# Patient Record
Sex: Male | Born: 1950
Health system: Southern US, Community
[De-identification: ages and names within clinical notes are randomized; demographics above are authoritative.]

## PROBLEM LIST (undated history)

## (undated) DIAGNOSIS — I8390 Asymptomatic varicose veins of unspecified lower extremity: Secondary | ICD-10-CM

## (undated) DIAGNOSIS — B009 Herpesviral infection, unspecified: Secondary | ICD-10-CM

## (undated) DIAGNOSIS — I4891 Unspecified atrial fibrillation: Secondary | ICD-10-CM

## (undated) DIAGNOSIS — Z8619 Personal history of other infectious and parasitic diseases: Secondary | ICD-10-CM

## (undated) DIAGNOSIS — K5790 Diverticulosis of intestine, part unspecified, without perforation or abscess without bleeding: Secondary | ICD-10-CM

## (undated) DIAGNOSIS — E7800 Pure hypercholesterolemia, unspecified: Secondary | ICD-10-CM

## (undated) HISTORY — PX: TOTAL SHOULDER REPLACEMENT: SUR1217

## (undated) HISTORY — PX: OTHER SURGICAL HISTORY: SHX169

## (undated) HISTORY — PX: TONSILLECTOMY: SUR1361

## (undated) HISTORY — DX: Diverticulosis of intestine, part unspecified, without perforation or abscess without bleeding: K57.90

## (undated) HISTORY — DX: Herpesviral infection, unspecified: B00.9

## (undated) HISTORY — DX: Unspecified atrial fibrillation: I48.91

## (undated) HISTORY — DX: Pure hypercholesterolemia, unspecified: E78.00

## (undated) HISTORY — DX: Personal history of other infectious and parasitic diseases: Z86.19

---

## 2002-10-14 HISTORY — PX: COLONOSCOPY: SHX174

## 2003-04-01 ENCOUNTER — Ambulatory Visit (HOSPITAL_COMMUNITY): Admission: RE | Admit: 2003-04-01 | Discharge: 2003-04-01 | Payer: Self-pay | Admitting: Family Medicine

## 2003-04-02 ENCOUNTER — Encounter: Payer: Self-pay | Admitting: Surgery

## 2003-04-02 ENCOUNTER — Inpatient Hospital Stay (HOSPITAL_COMMUNITY): Admission: EM | Admit: 2003-04-02 | Discharge: 2003-04-05 | Payer: Self-pay

## 2003-04-19 ENCOUNTER — Encounter: Admission: RE | Admit: 2003-04-19 | Discharge: 2003-04-19 | Payer: Self-pay | Admitting: Family Medicine

## 2003-04-25 ENCOUNTER — Encounter: Admission: RE | Admit: 2003-04-25 | Discharge: 2003-04-25 | Payer: Self-pay | Admitting: Family Medicine

## 2003-04-25 ENCOUNTER — Encounter: Payer: Self-pay | Admitting: Family Medicine

## 2003-07-26 ENCOUNTER — Ambulatory Visit (HOSPITAL_COMMUNITY): Admission: RE | Admit: 2003-07-26 | Discharge: 2003-07-26 | Payer: Self-pay | Admitting: *Deleted

## 2004-02-22 ENCOUNTER — Encounter: Admission: RE | Admit: 2004-02-22 | Discharge: 2004-02-22 | Payer: Self-pay | Admitting: Family Medicine

## 2005-09-27 ENCOUNTER — Ambulatory Visit: Payer: Self-pay | Admitting: Sports Medicine

## 2007-07-30 ENCOUNTER — Ambulatory Visit: Payer: Self-pay | Admitting: Family Medicine

## 2008-01-01 ENCOUNTER — Ambulatory Visit: Payer: Self-pay | Admitting: Family Medicine

## 2008-01-05 ENCOUNTER — Ambulatory Visit: Payer: Self-pay | Admitting: Family Medicine

## 2008-01-15 ENCOUNTER — Ambulatory Visit: Payer: Self-pay | Admitting: Family Medicine

## 2008-09-30 ENCOUNTER — Ambulatory Visit: Payer: Self-pay | Admitting: Family Medicine

## 2008-10-06 ENCOUNTER — Ambulatory Visit: Payer: Self-pay | Admitting: Internal Medicine

## 2008-10-06 ENCOUNTER — Ambulatory Visit: Payer: Self-pay

## 2008-10-06 ENCOUNTER — Encounter: Payer: Self-pay | Admitting: Internal Medicine

## 2008-10-10 ENCOUNTER — Ambulatory Visit: Payer: Self-pay

## 2008-10-10 LAB — CONVERTED CEMR LAB
ALT: 52 units/L (ref 0–53)
AST: 35 units/L (ref 0–37)
Albumin: 3.8 g/dL (ref 3.5–5.2)
BUN: 20 mg/dL (ref 6–23)
Basophils Relative: 0.2 % (ref 0.0–3.0)
CO2: 28 meq/L (ref 19–32)
Chloride: 108 meq/L (ref 96–112)
Creatinine, Ser: 0.9 mg/dL (ref 0.4–1.5)
Eosinophils Absolute: 0.1 10*3/uL (ref 0.0–0.7)
Eosinophils Relative: 1.1 % (ref 0.0–5.0)
Lymphocytes Relative: 41.6 % (ref 12.0–46.0)
MCV: 88.3 fL (ref 78.0–100.0)
Neutrophils Relative %: 45.9 % (ref 43.0–77.0)
RBC: 5.26 M/uL (ref 4.22–5.81)
Total Protein: 6.7 g/dL (ref 6.0–8.3)
WBC: 6 10*3/uL (ref 4.5–10.5)
aPTT: 33.5 s — ABNORMAL HIGH (ref 21.7–29.8)

## 2008-10-11 ENCOUNTER — Ambulatory Visit: Payer: Self-pay | Admitting: Cardiology

## 2008-10-11 ENCOUNTER — Encounter: Payer: Self-pay | Admitting: Cardiology

## 2008-10-11 ENCOUNTER — Ambulatory Visit (HOSPITAL_COMMUNITY): Admission: RE | Admit: 2008-10-11 | Discharge: 2008-10-11 | Payer: Self-pay | Admitting: Cardiology

## 2008-10-12 ENCOUNTER — Ambulatory Visit: Payer: Self-pay | Admitting: Cardiology

## 2008-10-18 ENCOUNTER — Ambulatory Visit: Payer: Self-pay | Admitting: Internal Medicine

## 2008-10-19 ENCOUNTER — Ambulatory Visit: Payer: Self-pay | Admitting: Internal Medicine

## 2008-10-21 ENCOUNTER — Ambulatory Visit (HOSPITAL_COMMUNITY): Admission: RE | Admit: 2008-10-21 | Discharge: 2008-10-21 | Payer: Self-pay | Admitting: Internal Medicine

## 2008-10-24 ENCOUNTER — Ambulatory Visit: Payer: Self-pay

## 2008-10-25 ENCOUNTER — Ambulatory Visit: Payer: Self-pay | Admitting: Internal Medicine

## 2008-11-03 ENCOUNTER — Ambulatory Visit: Payer: Self-pay | Admitting: Internal Medicine

## 2008-11-10 ENCOUNTER — Ambulatory Visit: Payer: Self-pay | Admitting: Cardiology

## 2008-11-10 ENCOUNTER — Ambulatory Visit: Payer: Self-pay | Admitting: Internal Medicine

## 2008-12-01 ENCOUNTER — Ambulatory Visit: Payer: Self-pay | Admitting: Family Medicine

## 2008-12-01 ENCOUNTER — Ambulatory Visit: Payer: Self-pay | Admitting: Internal Medicine

## 2008-12-08 ENCOUNTER — Ambulatory Visit: Payer: Self-pay | Admitting: Cardiology

## 2008-12-23 ENCOUNTER — Encounter: Payer: Self-pay | Admitting: Internal Medicine

## 2008-12-23 ENCOUNTER — Ambulatory Visit: Payer: Self-pay | Admitting: Internal Medicine

## 2009-01-05 ENCOUNTER — Ambulatory Visit: Payer: Self-pay | Admitting: Internal Medicine

## 2009-02-02 ENCOUNTER — Ambulatory Visit: Payer: Self-pay | Admitting: Cardiovascular Disease

## 2009-02-15 ENCOUNTER — Ambulatory Visit: Payer: Self-pay | Admitting: Family Medicine

## 2009-03-02 ENCOUNTER — Ambulatory Visit: Payer: Self-pay | Admitting: Internal Medicine

## 2009-03-14 ENCOUNTER — Encounter: Payer: Self-pay | Admitting: *Deleted

## 2009-03-20 ENCOUNTER — Ambulatory Visit: Payer: Self-pay | Admitting: Cardiology

## 2009-03-27 ENCOUNTER — Ambulatory Visit: Payer: Self-pay | Admitting: Cardiology

## 2009-03-27 LAB — CONVERTED CEMR LAB
POC INR: 1.9
Protime: 17

## 2009-03-30 ENCOUNTER — Ambulatory Visit: Payer: Self-pay | Admitting: Internal Medicine

## 2009-03-30 ENCOUNTER — Telehealth (INDEPENDENT_AMBULATORY_CARE_PROVIDER_SITE_OTHER): Payer: Self-pay | Admitting: *Deleted

## 2009-03-31 LAB — CONVERTED CEMR LAB
Basophils Relative: 0.8 % (ref 0.0–3.0)
Chloride: 107 meq/L (ref 96–112)
Creatinine, Ser: 1 mg/dL (ref 0.4–1.5)
Eosinophils Relative: 1.7 % (ref 0.0–5.0)
HCT: 43.1 % (ref 39.0–52.0)
INR: 2.8 — ABNORMAL HIGH (ref 0.8–1.0)
MCV: 88 fL (ref 78.0–100.0)
Monocytes Absolute: 0.7 10*3/uL (ref 0.1–1.0)
Neutrophils Relative %: 47.5 % (ref 43.0–77.0)
Potassium: 4.3 meq/L (ref 3.5–5.1)
RBC: 4.9 M/uL (ref 4.22–5.81)
WBC: 6.4 10*3/uL (ref 4.5–10.5)

## 2009-04-03 ENCOUNTER — Ambulatory Visit: Payer: Self-pay | Admitting: Internal Medicine

## 2009-04-03 LAB — CONVERTED CEMR LAB
POC INR: 3.3
Protime: 22

## 2009-04-06 ENCOUNTER — Encounter: Payer: Self-pay | Admitting: Internal Medicine

## 2009-04-10 ENCOUNTER — Ambulatory Visit: Payer: Self-pay | Admitting: Cardiovascular Disease

## 2009-04-10 ENCOUNTER — Encounter (INDEPENDENT_AMBULATORY_CARE_PROVIDER_SITE_OTHER): Payer: Self-pay | Admitting: Cardiology

## 2009-04-10 ENCOUNTER — Ambulatory Visit: Payer: Self-pay | Admitting: Internal Medicine

## 2009-04-10 LAB — CONVERTED CEMR LAB
BUN: 23 mg/dL (ref 6–23)
Basophils Absolute: 0 10*3/uL (ref 0.0–0.1)
Calcium: 9.3 mg/dL (ref 8.4–10.5)
Creatinine, Ser: 1.1 mg/dL (ref 0.4–1.5)
Eosinophils Relative: 1.9 % (ref 0.0–5.0)
GFR calc non Af Amer: 73.11 mL/min (ref >60)
HCT: 43.2 % (ref 39.0–52.0)
Lymphocytes Relative: 42.8 % (ref 12.0–46.0)
Monocytes Relative: 11.5 % (ref 3.0–12.0)
Neutrophils Relative %: 43.7 % (ref 43.0–77.0)
Platelets: 198 10*3/uL (ref 150.0–400.0)
Potassium: 4 meq/L (ref 3.5–5.1)
Prothrombin Time: 25.7 s
WBC: 6.8 10*3/uL (ref 4.5–10.5)
aPTT: 43.6 s — ABNORMAL HIGH (ref 21.7–28.8)

## 2009-04-14 ENCOUNTER — Ambulatory Visit: Payer: Self-pay | Admitting: Cardiovascular Disease

## 2009-04-18 ENCOUNTER — Encounter: Payer: Self-pay | Admitting: Internal Medicine

## 2009-04-18 ENCOUNTER — Ambulatory Visit: Payer: Self-pay | Admitting: Internal Medicine

## 2009-04-18 ENCOUNTER — Ambulatory Visit (HOSPITAL_COMMUNITY): Admission: RE | Admit: 2009-04-18 | Discharge: 2009-04-19 | Payer: Self-pay | Admitting: Internal Medicine

## 2009-04-19 ENCOUNTER — Encounter: Payer: Self-pay | Admitting: *Deleted

## 2009-04-24 ENCOUNTER — Ambulatory Visit: Payer: Self-pay | Admitting: Cardiology

## 2009-04-24 ENCOUNTER — Encounter (INDEPENDENT_AMBULATORY_CARE_PROVIDER_SITE_OTHER): Payer: Self-pay | Admitting: Cardiology

## 2009-04-24 LAB — CONVERTED CEMR LAB
POC INR: 3.6
Prothrombin Time: 22.9 s

## 2009-05-12 ENCOUNTER — Ambulatory Visit: Payer: Self-pay | Admitting: Cardiology

## 2009-05-26 ENCOUNTER — Ambulatory Visit: Payer: Self-pay | Admitting: Cardiology

## 2009-05-26 LAB — CONVERTED CEMR LAB: POC INR: 2.7

## 2009-06-16 ENCOUNTER — Ambulatory Visit: Payer: Self-pay | Admitting: Internal Medicine

## 2009-06-16 LAB — CONVERTED CEMR LAB: POC INR: 2

## 2009-07-12 ENCOUNTER — Ambulatory Visit: Payer: Self-pay | Admitting: Cardiovascular Disease

## 2009-07-12 ENCOUNTER — Ambulatory Visit: Payer: Self-pay | Admitting: Internal Medicine

## 2009-08-09 ENCOUNTER — Encounter: Payer: Self-pay | Admitting: Cardiology

## 2009-08-09 ENCOUNTER — Ambulatory Visit: Payer: Self-pay | Admitting: Cardiology

## 2009-08-09 ENCOUNTER — Telehealth: Payer: Self-pay | Admitting: Internal Medicine

## 2009-10-16 ENCOUNTER — Ambulatory Visit: Payer: Self-pay | Admitting: Internal Medicine

## 2010-01-10 ENCOUNTER — Ambulatory Visit: Payer: Self-pay | Admitting: Family Medicine

## 2010-05-30 ENCOUNTER — Ambulatory Visit: Payer: Self-pay | Admitting: Internal Medicine

## 2010-05-30 DIAGNOSIS — E785 Hyperlipidemia, unspecified: Secondary | ICD-10-CM | POA: Insufficient documentation

## 2010-06-29 ENCOUNTER — Telehealth: Payer: Self-pay | Admitting: Internal Medicine

## 2010-07-02 ENCOUNTER — Ambulatory Visit: Payer: Self-pay | Admitting: Internal Medicine

## 2010-07-02 DIAGNOSIS — R42 Dizziness and giddiness: Secondary | ICD-10-CM | POA: Insufficient documentation

## 2010-07-02 DIAGNOSIS — I10 Essential (primary) hypertension: Secondary | ICD-10-CM | POA: Insufficient documentation

## 2010-10-02 ENCOUNTER — Ambulatory Visit: Payer: Self-pay | Admitting: Family Medicine

## 2010-11-13 NOTE — Assessment & Plan Note (Signed)
Summary: V4U      Allergies Added: NKDA  Visit Type:  Follow-up Referring Provider:  Berton Mount, MD Primary Provider:  Sharlot Gowda, MD   History of Present Illness: The patient presents today for routine electrophysiology followup. He reports doing very well since last being seen in our clinic. He is unaware of any further atrial fibrillation s/p ablation.  The patient denies symptoms of palpitations, chest pain, shortness of breath, orthopnea, PND, lower extremity edema, dizziness, presyncope, syncope, or neurologic sequela. The patient is tolerating medications without difficulties and is otherwise without complaint today.   Current Medications (verified): 1)  Vytorin 10-80 Mg Tabs (Ezetimibe-Simvastatin) .Marland Kitchen.. 1 By Mouth Once Daily 2)  Aspirin Ec 325 Mg Tbec (Aspirin) .... Take One Tablet By Mouth Daily 3)  Multivitamins   Tabs (Multiple Vitamin) .... Qd  Allergies (verified): No Known Drug Allergies  Past History:  Past Medical History: Reviewed history from 07/12/2009 and no changes required. 1. Persistent atrial fibrillation s/p atrial fibrillation ablation 7/10 2. Diverticulitis.  3. Hypercholesterolemia 4.Hypokalemia.  Past Surgical History: Reviewed history from 07/12/2009 and no changes required. S/p atrial fibrillation ablation 7/10  Social History: Reviewed history from 12/23/2008 and no changes required. 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 He denies tobacco alcohol or drug use.  Vital Signs:  Patient profile:   60 year old male Height:      69 inches Weight:      177 pounds BMI:     26.23 Pulse rate:   65 / minute BP sitting:   119 / 84  (left arm)  Vitals Entered By: Laurance Flatten CMA (May 30, 2010 10:11 AM)  Physical Exam  General:  Well developed, well nourished, in no acute distress. Head:  normocephalic and atraumatic Eyes:  PERRLA/EOM intact; conjunctiva and lids normal. Mouth:  Teeth, gums  and palate normal. Oral mucosa normal. Neck:  Neck supple, no JVD. No masses, thyromegaly or abnormal cervical nodes. Lungs:  Clear bilaterally to auscultation and percussion. Heart:  Non-displaced PMI, chest non-tender; regular rate and rhythm, S1, S2 without murmurs, rubs or gallops. Carotid upstroke normal, no bruit. Normal abdominal aortic size, no bruits. Femorals normal pulses, no bruits. Pedals normal pulses. No edema, no varicosities. Abdomen:  Bowel sounds positive; abdomen soft and non-tender without masses, organomegaly, or hernias noted. No hepatosplenomegaly. Msk:  Back normal, normal gait. Muscle strength and tone normal. Pulses:  pulses normal in all 4 extremities Extremities:  No clubbing or cyanosis. Neurologic:  Alert and oriented x 3.   EKG  Procedure date:  05/30/2010  Findings:      sinus rhythm 65 bpm, normal ekg  Impression & Recommendations:  Problem # 1:  ATRIAL FIBRILLATION (ICD-427.31) Doing well s/p afib ablation without futher episodes of afib off of AAD.  He takes ASA 325mg  daily for CHADs 2 score of 0.   The patient will return in 12 months.  Problem # 2:  HYPERLIPIDEMIA (ICD-272.4) I have advised pt of FDA recommendation to decrease simvastatin to 40mg  daily.  He understands but wishes to continue his present dose and discuss further with his PCP.  He will alert me for myalgias  Other Orders: EKG w/ Interpretation (93000)  Patient Instructions: 1)  Your physician recommends that you schedule a follow-up appointment in: 12 months with Dr Johney Frame

## 2010-11-13 NOTE — Assessment & Plan Note (Signed)
Summary: appt time @ 8:45/per check out/sf  Medications Added ASPIRIN EC 325 MG TBEC (ASPIRIN) Take one tablet by mouth daily      Allergies Added: NKDA  Visit Type:  Follow-up Referring Provider:  Berton Mount, MD Primary Provider:  Sharlot Gowda, MD   History of Present Illness: The patient presents today for routine electrophysiology followup. He reports doing very well since last being seen in our clinic. He is unaware of any further atrial fibrillation.  The patient denies symptoms of palpitations, chest pain, shortness of breath, orthopnea, PND, lower extremity edema, dizziness, presyncope, syncope, or neurologic sequela. The patient is tolerating medications without difficulties and is otherwise without complaint today.   Current Medications (verified): 1)  Vytorin 10-80 Mg Tabs (Ezetimibe-Simvastatin) .Marland Kitchen.. 1 By Mouth Once Daily 2)  Proamatine 2.5 Mg Tabs (Midodrine Hcl) .Marland Kitchen.. 1 By Mouth Two Times A Day 3)  Verapamil Hcl Cr 180 Mg Xr24h-Cap (Verapamil Hcl) .... Take One Capsule By Mouth Once A Day 4)  Aspirin Ec 325 Mg Tbec (Aspirin) .... Take One Tablet By Mouth Daily 5)  Multivitamins   Tabs (Multiple Vitamin) .... Qd  Allergies (verified): No Known Drug Allergies  Past History:  Past Medical History: Reviewed history from 07/12/2009 and no changes required. 1. Persistent atrial fibrillation s/p atrial fibrillation ablation 7/10 2. Diverticulitis.  3. Hypercholesterolemia 4.Hypokalemia.  Past Surgical History: Reviewed history from 07/12/2009 and no changes required. S/p atrial fibrillation ablation 7/10  Vital Signs:  Patient profile:   60 year old male Height:      69 inches Weight:      183 pounds BMI:     27.12 Pulse rate:   59 / minute BP sitting:   110 / 80  (left arm)  Vitals Entered By: Laurance Flatten CMA (October 16, 2009 9:58 AM)  Physical Exam  General:  Well developed, well nourished, in no acute distress. Head:  normocephalic and atraumatic Eyes:   PERRLA/EOM intact; conjunctiva and lids normal. Nose:  no deformity, discharge, inflammation, or lesions Mouth:  Teeth, gums and palate normal. Oral mucosa normal. Neck:  Neck supple, no JVD. No masses, thyromegaly or abnormal cervical nodes. Lungs:  Clear bilaterally to auscultation and percussion. Heart:  Non-displaced PMI, chest non-tender; regular rate and rhythm, S1, S2 without murmurs, rubs or gallops. Carotid upstroke normal, no bruit. Normal abdominal aortic size, no bruits. Femorals normal pulses, no bruits. Pedals normal pulses. No edema, no varicosities. Abdomen:  Bowel sounds positive; abdomen soft and non-tender without masses, organomegaly, or hernias noted. No hepatosplenomegaly. Msk:  Back normal, normal gait. Muscle strength and tone normal. Pulses:  pulses normal in all 4 extremities Extremities:  No clubbing or cyanosis. Neurologic:  Alert and oriented x 3. Skin:  Intact without lesions or rashes. Cervical Nodes:  no significant adenopathy Psych:  Normal affect.   EKG  Procedure date:  10/16/2009  Findings:      sinus rhythm 60 bpm, otherwise normal ekg  Impression & Recommendations:  Problem # 1:  ATRIAL FIBRILLATION (ICD-427.31) Doing well s/p afib ablation without futher episodes of afib.  He takes ASA 325mg  daily for CHADs 2 score of 0. I will stop verapamil and midodrine at this time.  The patient will return in 6 months.  Other Orders: EKG w/ Interpretation (93000)  Patient Instructions: 1)  Your physician recommends that you schedule a follow-up appointment in: 6 months with Dr Johney Frame  2)  Your physician has recommended you make the following change in your  medication: decrease Verapamil in 1/2 for 7 days then stop and decrease Proamatineto once daily for 10 days then stop

## 2010-11-13 NOTE — Assessment & Plan Note (Signed)
Summary: pt having problems/kl      Allergies Added: NKDA  Visit Type:  Follow-up Referring Provider:  Berton Mount, MD Primary Provider:  Sharlot Gowda, MD   History of Present Illness: The patient presents today for routine electrophysiology followup. He reports having difficulty since last being seen in our clinic. He states that he was recently at a football game in [place] when he lost his balance and fell.  He states that he had had "several beers" and clearly remembers tripping.  He then fell to the floor and had loss of consciousness.  He was seen in the ER and had an MRI of the brain which he states was unremarkable.  He did suffer a R eye contusion as well as R shoulder dislocation and other minor injuries.  He does not feel that syncope was the cause for the event.  He has had no further afib and denies CP, SOB, palpitaitons, or other cardiac concerns. His concern today is that his blood pressure at home during the past few days was 190s/80s.  He states that this was recorded during severe pain.  He feels that as his pain has improved, his BP has decreased.  He also reports orthostatic dizziness for several days.  He denies HA, visual changes, N/V, or other neuro complaints.  Current Medications (verified): 1)  Vytorin 10-80 Mg Tabs (Ezetimibe-Simvastatin) .Marland Kitchen.. 1 By Mouth Once Daily 2)  Aspirin Ec 325 Mg Tbec (Aspirin) .... Take One Tablet By Mouth Daily 3)  Multivitamins   Tabs (Multiple Vitamin) .... Qd  Allergies (verified): No Known Drug Allergies  Past History:  Past Medical History: Reviewed history from 07/12/2009 and no changes required. 1. Persistent atrial fibrillation s/p atrial fibrillation ablation 7/10 2. Diverticulitis.  3. Hypercholesterolemia 4.Hypokalemia.  Past Surgical History: Reviewed history from 07/12/2009 and no changes required. S/p atrial fibrillation ablation 7/10  Social History: Reviewed history from 12/23/2008 and no changes  required. 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 He denies tobacco alcohol or drug use.  Review of Systems       All systems are reviewed and negative except as listed in the HPI.   Vital Signs:  Patient profile:   60 year old male Height:      69 inches Weight:      176 pounds BMI:     26.08 Pulse rate:   68 / minute Pulse (ortho):   80 / minute BP sitting:   122 / 84  (left arm) BP standing:   122 / 82  Vitals Entered By: Laurance Flatten CMA (July 02, 2010 1:04 PM)  Serial Vital Signs/Assessments:  Time      Position  BP       Pulse  Resp  Temp     By           Lying LA  130/80   64                    Jewel Hardy CMA           Sitting   122/82   [address] CMA           Standing  122/82   80                    Laurance Flatten CMA  Standing  128/84   84                    Jewel Hardy CMA           Standing  120/82   86                    Jewel Hardy CMA   Physical Exam  General:  Well developed, well nourished, in no acute distress. Head:  normocephalic and atraumatic Eyes:  R eye with periorbital ecchymosis Mouth:  Teeth, gums and palate normal. Oral mucosa normal. Neck:  Neck supple, no JVD. No masses, thyromegaly or abnormal cervical nodes. Lungs:  Clear bilaterally to auscultation and percussion. Heart:  Non-displaced PMI, chest non-tender; regular rate and rhythm, S1, S2 without murmurs, rubs or gallops. Carotid upstroke normal, no bruit. Normal abdominal aortic size, no bruits. Femorals normal pulses, no bruits. Pedals normal pulses. No edema, no varicosities. Abdomen:  Bowel sounds positive; abdomen soft and non-tender without masses, organomegaly, or hernias noted. No hepatosplenomegaly. Msk:  no obvious deformity Pulses:  pulses normal in all 4 extremities Extremities:  No clubbing or cyanosis. Neurologic:  CNII-XII intact, strength/sensation are intact, FTF/rhomberg are normal today Skin:   Intact without lesions or rashes. Psych:  Normal affect.   EKG  Procedure date:  07/02/2010  Findings:      sinus rhythm 68 bpm, PR 184, Qtc 4341, otherwise normal ekg  Impression & Recommendations:  Problem # 1:  ATRIAL FIBRILLATION (ICD-427.31) maintaining sinus continue ASA  Problem # 2:  ESSENTIAL HYPERTENSION, BENIGN (ICD-401.1) at goal today, recent elevation in BP likely due to severe pain salt restriction advised pt to continue to follow BP and alert our office if BP remains significantly elevated at home I would consider an ARB which has been shown to be beneficial in reducing afib episodes if an antihypertensive is required  Problem # 3:  DIZZINESS (ICD-780.4) pt is mildly orthostatic today adequate hydration is advised follow-up with PCP if not improved given recent trauma  Problem # 4:  HYPERLIPIDEMIA (ICD-272.4) stable His updated medication list for this problem includes:    Vytorin 10-80 Mg Tabs (Ezetimibe-simvastatin) .Marland Kitchen... 1 by mouth once daily  Other Orders: EKG w/ Interpretation (93000)  Patient Instructions: 1)  INCREASE FLUIDS 2)  Your physician recommends that you schedule a follow-up appointment in: 1 YEAR.

## 2010-11-13 NOTE — Progress Notes (Signed)
Summary: c/o mild htn . b/p reading    Phone Note Call from Patient Call back at Home Phone [phone] Call back at [phone]   Caller: Patient Reason for Call: Talk to Nurse Details for Reason: c/o mild htn, b/p bounce 130-150. b/p today 140/89  ~ 6:30 -7a.m.  Initial call taken by: Lorne Skeens,  June 29, 2010 9:53 AM  Follow-up for Phone Call        pt called c/o heart feeling funny and having dizziness from lying to sitting not sitting to standing.  He was at a [employer] football game 2 weeks ago and feel coming out of the BR he woke up going to the hospital.  Thinks he tripped over a carpet but not for sure.  He was in rhythm at the hospital. He dislocated his shoulder in the fall.  He is concerned about all of these things.  Therefor Im going to set him up to see Dr Johney Frame next week Dennis Bast, RN, BSN  June 29, 2010 11:20 AM  Additional Follow-up for Phone Call Additional follow up Details #1::        Will arrange follow up on Mon at 12:45pm  Pt aware Dennis Bast, RN, BSN  June 29, 2010 11:28 AM

## 2011-01-20 LAB — PROTIME-INR
INR: 3.1 — ABNORMAL HIGH (ref 0.00–1.49)
Prothrombin Time: 31 seconds — ABNORMAL HIGH (ref 11.6–15.2)

## 2011-01-20 LAB — APTT: aPTT: 49 seconds — ABNORMAL HIGH (ref 24–37)

## 2011-01-28 LAB — BASIC METABOLIC PANEL
CO2: 25 mEq/L (ref 19–32)
Calcium: 9.1 mg/dL (ref 8.4–10.5)
Creatinine, Ser: 1.04 mg/dL (ref 0.4–1.5)
GFR calc Af Amer: >60 mL/min (ref >60)
Glucose, Bld: 93 mg/dL (ref 70–99)

## 2011-01-28 LAB — PROTIME-INR
INR: 1.8 — ABNORMAL HIGH (ref 0.00–1.49)
Prothrombin Time: 22.2 seconds — ABNORMAL HIGH (ref 11.6–15.2)

## 2011-01-28 LAB — CBC
MCHC: 33.8 g/dL (ref 30.0–36.0)
RDW: 12.9 % (ref 11.5–15.5)

## 2011-02-14 ENCOUNTER — Encounter: Payer: Self-pay | Admitting: Family Medicine

## 2011-02-14 DIAGNOSIS — K5790 Diverticulosis of intestine, part unspecified, without perforation or abscess without bleeding: Secondary | ICD-10-CM | POA: Insufficient documentation

## 2011-02-26 NOTE — Assessment & Plan Note (Signed)
[facility]                         ELECTROPHYSIOLOGY OFFICE NOTE   NAME:Zheng, JAMONTAE THWAITES                  MRN:          [id number]  DATE:11/10/2008                            DOB:          Apr 09, 1951    Mr. Noll comes in today having days that he feels good and feels  bad.  He had gone for TEE cardioversion on his flecainide dose of 100.  He has converted spontaneously to sinus rhythm.  Because of recurrent  symptoms of feeling breathless and fatigued, we empirically increased  his flecainide to 150 twice a day.  He comes in today feeling washed out  and tired and yesterday was quite breathless.  His breathlessness is  better today.   MEDICATIONS:  Currently include:  1. Diltiazem 180.  2. Coumadin.  3. Vytorin 10/80.  4. Flecainide 150 b.i.d.   PHYSICAL EXAMINATION:  VITAL SIGNS:  Blood pressure was 139/91 with  pulse of 58.  There was a mild orthostatic drop to 119/86 after 5  minutes of prolonged standing.  His weight was 182 which was stable.  LUNGS:  Clear.  HEART:  Sounds were regular.  EXTREMITIES:  Without edema.  I did not take his pulse.   Electrocardiogram dated today demonstrated sinus rhythm at 58 with  intervals of 0.24/0.11/0.43.   IMPRESSION:  1. Paroxysmal atrial fibrillation, on flecainide therapy.  2. Intermittent symptoms, questionably related to paroxysms of atrial      fibrillation, not now evident.  3. PR prolongation and QRS prolongation associated with drug compared      to electrocardiogram October 21, 2008, with PR interval of      0.22/0.09.   Mr. Skibinski has been taught to takehispulse.  We will try and  correlate his symptoms with either his atrial fibrillation or residual  of his atrial fibrillation.  We will see him again in 3-4 weeks' time.  We will also need to keep a close eye on his conduction.  While this is  anticipated in the setting of his 1C therapy with the recent up-  titration of the  flecainide, we will have to keep a still closer eye.  This may have some impact on our choices of alternative antiarrhythmic  drug, i.e., would propafenone do something similar?     Duke Salvia, MD, [facility]  Electronically Signed   SCK/MedQ  DD: 11/10/2008  DT: 11/11/2008  Job #: [id number]

## 2011-02-26 NOTE — Assessment & Plan Note (Signed)
[facility]                         ELECTROPHYSIOLOGY OFFICE NOTE   NAME:Maurice [name], Maurice [name]                  MRN:          [id number]  DATE:12/01/2008                            DOB:          Jul 18, 1951    [name] is seen because of problems following the initiation of  midodrine, ongoing problems with palpitations, and chest heaviness.  He  has had no atrial fibrillation as best as he can detect by palpitation,  but he has had sensation of his heart rate has been pounding.  He also  has a sense that there is heaviness in his heart.  The dizziness for  which admitted was started, it is reduced.  The fatigue that was, we  thought might be related to his diltiazem.  He is also better on the  verapamil, but the symptoms persist.   His blood pressure today was 120/90, his pulse was 60.  His weight was  183.  Lungs were clear.  Heart sounds were regular.  His extremities  were without edema.   Electrocardiogram dated today demonstrated sinus rhythm at 60 with  intervals of 0.22/0.09/0.42.  The axis was 75 degrees.  These parameters  are relatively stable compared to before.   IMPRESSION:  1. Paroxysmal atrial fibrillation.  2. Flecainide therapy for paroxysmal atrial fibrillation.  3. Ongoing problems with palpitations, question mechanism, they does      not think that is atrial fibrillation.  4. Fatigue, question drug effect.   [name] continues to have significant symptoms related to either  the arrhythmia or the medicinal therapy for the arrhythmia.  He heart  Dr. Jenel Lucks talk last night, and he was very enthused and asked if he  could be considered for catheter ablation, as an alternative to  medicinal therapy.  I told we would be glad to facilitate that.   In the interim, we will plan to decrease his flecainide from 150-100 mg  twice daily to see whether this further improves his symptoms.   We will maintain his other  medications, as they currently are set.   I also suggested that his fatigue may be something else.  He is fairly  certain that he does not have sleep disordered breathing and that the  symptoms all began at the time of his atrial fibrillation.   We will see him again in 6 weeks' time.    Duke Salvia, MD, [facility]  Electronically Signed   SCK/MedQ  DD: 12/01/2008  DT: 12/02/2008  Job #: [id number]

## 2011-02-26 NOTE — Discharge Summary (Signed)
NAMEDERIUS, GHOSH NO.:  [id number]   MEDICAL RECORD NO.:  [id number]          PATIENT TYPE:  INP   LOCATION:  2924                         FACILITY:  [facility]   PHYSICIAN:  Hillis Range, MD       DATE OF BIRTH:  August 19, 1951   DATE OF ADMISSION:  04/18/2009  DATE OF DISCHARGE:  04/19/2009                               DISCHARGE SUMMARY   PRIMARY CARDIOLOGIST:  Duke Salvia, MD, [facility]   DISCHARGE DIAGNOSES:  1. Persistent atrial fibrillation  2. Atypical atrial flutter following atrial fibrillation ablation.      The patient successfully returned to sinus rhythm with ibutilide.  3. Chronic anticoagulation therapy with an INR of 2.7 at time of      discharge.   Past medical history includes:  1. Persistent atrial fibrillation.  2. Diverticulitis.  3. Hypercholesteremia.  4. Hypokalemia.   Procedures this admission include  1. Comprehensive electrophysiologic study with three-dimensional      mapping of atrial fibrillation.  Successful electrical isolation      and anatomical encircling of all 4 pulmonary veins using      radiofrequency current with ablation of complex fractionated atrial      electrograms along the roots of the left atrium.  The patient also      noted to have a driver within the right inferior pulmonary vein      which was successfully ablated by electrically isolating this vein      with spontaneous termination of atrial fibrillation upon isolation      of the right inferior pulmonary vein.  2. Transesophageal echocardiogram on April 18, 2009, by Dr. Olga Millers.  The patient with estimated ejection fraction of 55-60%,      trivial regurgitation of the aortic valve, mild regurgitation of      mitral valve.   HOSPITAL COURSE:  Mr. Wainwright is a 60 year old Caucasian gentleman  with persistent atrial fibrillation, ongoing palpitations, chest  heaviness, dizziness with initiation of midodrine therapy in the past,  ongoing fatigue,  symptoms related to either the arrhythmia or the  medical therapy for the arrhythmia.  However, ongoing symptoms Dr. Graciela Husbands  referred the patient to Dr. Johney Frame for consideration of atrial  fibrillation ablation.  The patient was educated on the risks and  benefits.  The patient agreed to proceed, admitted on April 18, 2009,  underwent TEE and EP lab with Dr. Jens Som and atrial fibrillation  ablation as stated above.  The patient initially had no inducible  arrhythmias following the ablation.  However, following morning the  patient with atypical atrial flutter treated with ibutilide, also  received magnesium and verapamil.  Within 5-hour window, the patient  converted to normal sinus rhythm, and repeat 12-lead EKG confirming  sinus rhythm with a first-degree block with a PACs at a rate of 64, PR  interval of 232 milliseconds, QRS of 88 milliseconds, QT of 436, and QTc  of 449 milliseconds.  The patient anxious to be discharged home.  I  scheduled him to follow up in the [facility]madin Clinic on Monday, April 24, 2009, at 3:15.  As stated, his INR is therapeutic at time of discharge  and follow up with Dr. Johney Frame on July 12, 2009, at 9:45.   Medications at time of discharge include:  1. Prilosec 20 mg daily for 6 weeks.  2. Flecainide 50 mg b.i.d.  3. Verapamil 180 mg daily.  4. Coumadin 5 mg daily or as instructed by Coumadin Clinic.  5. Vytorin 10/80 daily.  6. Multivitamin as previously taken.  7. Midodrine 2.5 mg b.i.d.  8. Aspirin 81 mg daily.   Duration of discharge encounter at 30 minutes.      Dorian Pod, ACNP      Hillis Range, MD  Electronically Signed    MB/MEDQ  D:  04/19/2009  T:  04/20/2009  Job:  [id number]

## 2011-02-26 NOTE — Op Note (Signed)
NAMEKEVRON, PATELLA NO.:  [id number]   MEDICAL RECORD NO.:  [id number]          PATIENT TYPE:  INP   LOCATION:  2924                         FACILITY:  [facility]   PHYSICIAN:  Hillis Range, MD       DATE OF BIRTH:  [birth year]   DATE OF PROCEDURE:  04/18/2009  DATE OF DISCHARGE:                               OPERATIVE REPORT   PREPROCEDURE DIAGNOSIS:  Persistent atrial fibrillation.   POSTPROCEDURE DIAGNOSES:  Persistent atrial fibrillation.   PROCEDURES:  1. Comprehensive electrophysiologic study.  2. Coronary sinus pacing and recording.  3. Three-dimensional mapping of supraventricular tachycardia.  4. Ablation of supraventricular tachycardia.  5. Intracardiac echocardiography.  6. Arterial blood pressure monitoring.  7. Pulmonary vein venography.  8. Transseptal puncture of an intact septum.   INTRODUCTION:  [name] is a pleasant 60 year old gentleman with a  history of persistent atrial fibrillation who presents today for EP  study and radiofrequency ablation.  He has previously failed medical  therapy with diltiazem and flecainide.  He has previously required  cardioversion in December 2009.  He now presents for EP study and  radiofrequency ablation.   DESCRIPTION OF PROCEDURE:  Informed written consent was obtained and the  patient was brought to the electrophysiology lab in a fasting state.  He  was adequately sedated with intravenous medications as outlined in the  anesthesia report.  The patient's right and left groins were prepped and  draped in the usual sterile fashion by the EP lab staff.  Using a  percutaneous Seldinger technique, one 6, one 7, and one 8 [place]  hemostasis sheaths were placed in the right common femoral vein.  An 27-  [place] hemostasis sheath was placed in the left common femoral vein.  A  4-French hemostasis sheath was placed in the right common femoral artery  for blood pressure monitoring.  The patient presented to  the  electrophysiology lab in normal sinus rhythm.  A 6-French decapolar  Polaris X catheter was introduced through the right common femoral vein  and advanced into the coronary sinus for recording and pacing from this  location.  A 6-French quadripolar Josephson catheter was introduced into  the right ventricle for recording and pacing and then pulled back to the  His bundle location.  The patient presented to the electrophysiology lab  in normal sinus rhythm.  His AH interval measured 110 milliseconds with  an HV interval of 51 milliseconds.  His RR interval measured 1100  milliseconds with a PR duration of 210 milliseconds, QRS duration of 110  milliseconds, and QT interval of 456 milliseconds.  Ventricular pacing  was performed which revealed VA dissociation at baseline.  A 10-French  Biosense Webster AcuNav intracardiac echocardiography catheter was  introduced through the left common femoral vein and advanced into the  right atrium.  Intracardiac echocardiography was then performed to  evaluate the left atrium.  This demonstrated minimal enlargement of the  left atrium.  All four pulmonary veins were noted to be moderate in size  with no evidence of pulmonary vein stenosis.  Each pulmonary vein had  its own separate ostium.  The middle right common femoral vein sheath  was then exchanged for an 8.5 [place] SL2 transseptal sheath and  transseptal access was achieved in a standard fashion using a  Brockenbrough needle under biplane fluoroscopy with intracardiac  echocardiography visualization of the transseptal puncture.  Once  transseptal access had been achieved, heparin was administered  intravenously and intra-arterially in order to maintain an ACT of  greater than 400 seconds throughout the procedure.  The His bundle  catheter was removed.  A 6-French multipurpose angiographic catheter  with guidewire was introduced through the transseptal sheath and  positioned over the mouth of  all four pulmonary veins.  Pulmonary  venograms were performed by hand injection of nonionic contrast and  demonstrated moderate-sized pulmonary veins with no evidence of  pulmonary vein stenosis.  The angiographic catheter was then removed.  A  3.5 mm Biosense Webster EZ [place] ThermoCool ablation catheter was  introduced through the right common femoral vein and advanced into the  right atrium.  The transseptal sheath was pulled back into the IVC over  a guidewire.  The ablation catheter was advanced across the transseptal  hole using the wire as a guide.  The transseptal sheath was then re-  advanced over the guidewire into the left atrium.  A 20-pole 20-mm  circular mapping catheter was introduced through the transseptal sheath  and positioned over the mouth of all four pulmonary veins.  Three-  dimensional electroanatomical mapping was performed using CartoSound  technology.  The patient was noted to have electrical activity within  all four pulmonary veins at baseline.  The patient underwent successful  sequential electrical isolation and anatomical encircling using  radiofrequency current with the circular mapping catheter as a guide.  The patient developed spontaneous onset of atrial fibrillation.  Complex  fractionated atrial electrograms were mapped along the roof of the left  atrium and successfully ablated in this location.  Additional mapping  along the posterior wall, interatrial septum, lateral wall and above the  coronary sinus was performed, however, there was a paucity of complex  fractionated atrial electrograms in this location.  Additional  radiofrequency applications were then delivered at the base of the right  inferior pulmonary vein.  The patient's atrial fibrillation terminated  with electrical isolation of the right inferior pulmonary vein.  The  patient remained in sinus rhythm thereafter.  Isoproterenol was then  infused up to 4 mcg per minute with an adequate  increase in heart rate  to 130 beats per minute.  The patient had no inducible arrhythmias with  isoproterenol infusion.  Isoproterenol was allowed to wash out and the  patient had no PACs, atrial tachycardia, atrial flutter, or atrial  fibrillation.  Rapid atrial pacing was then performed which revealed an  AV Wenckebach cycle length of 420 milliseconds with no inducible  arrhythmias.  The procedure was therefore considered completed.  All  catheters were removed and the sheaths were aspirated and flushed.  Intracardiac echocardiography was again performed which revealed no  pericardial effusion.  The patient was transferred to the recovery area  for sheath removal per protocol.  A limited bedside transthoracic  echocardiogram revealed no pericardial effusion.  There were no early  apparent complications.   CONCLUSIONS:  1. Sinus rhythm upon presentation.  2. Successful electrical isolation and anatomical encircling of all      four pulmonary veins using radiofrequency current.  3. Ablation of complex fractionated atrial electrograms along the roof      of the  left atrium.  4. The patient was noted to have a driver within the right inferior      pulmonary vein which was successfully ablated by electrically      isolating this vein with spontaneous termination of atrial      fibrillation upon isolation of the right inferior pulmonary vein.  5. No inducible arrhythmias following ablation with isoproterenol      infused up to 4 mcg per minute.  6. No early apparent complications.      Hillis Range, MD  Electronically Signed     JA/MEDQ  D:  04/18/2009  T:  04/19/2009  Job:  [id number]   cc:   Duke Salvia, MD, [facility]

## 2011-02-26 NOTE — Letter (Signed)
October 06, 2008    Sharlot Gowda, M.D.  [address]  [place], [place]  [address]   RE:  Maurice [name], Maurice [name]  MRN:  [id number]  /  DOB:  04-Feb-1951   Dear [name]:   It was a pleasure seeing Maurice [name] at your request  regarding his newly identified atrial fibrillation.   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  He presented last week to your office with tachy palpitations associated  lightheadedness, diaphoresis, and orthostatic intolerance which had  interrupted his ability to go to his spin class.  He was found in your  office to be in atrial fibrillation with a rate that was not that rapid  at 91.  You talked to our office and had him put on diltiazem and  referred for further evaluation.  He remains in atrial fibrillation  [name].  His symptoms have persisted as noted above and this has been  quite disruptive to his life.   It turns out that he has had similar symptoms before.  He was on  vacation in the [place] a couple years ago when he had an  episode that lasted a couple of days and terminated and then he had  another intercurrent episode.  I do recall duration which also was  terminated spontaneously.  He is quite active as noted above.  He is  very fit.  He does not have any exertional chest discomfort apart from  when he is in this rhythm.  In addition the aforementioned symptoms, he  has had a right-sided discomfort in his chest which occurs only with his  atrial fibrillation.   His thromboembolic risk factors are broadly negative for hypertension,  diabetes, prior stroke, heart failure vascular disease, and age.   His cardiac risk factors are notable for hypercholesterolemia.   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   In addition,  he has a history of diverticulitis.   He has had no prior surgeries.   SOCIAL HISTORY:  He is unmarried.  He has no children.  He lives with a  roommate.  He does not use cigarettes or recreational drugs.  He does  use alcohol some.   His medications include Vytorin 10/80, finasteride 5, diltiazem 180  started in your office, fish oils, and multivitamins, as well as aspirin  81 mg a day.   On examination, he is a healthy-appearing 60 year old Caucasian male.  His blood pressure is 116/77.  His pulse was 77 and irregular.  His HEENT exam demonstrated no icterus or xanthomata.  His neck veins  were flat.  His carotids are brisk and full bilaterally without bruits.  BACK:  Without kyphosis or scoliosis.  The lungs were clear.  HEART:  Heart sounds were irregular without murmurs or gallops.  ABDOMEN:  Soft  with active bowel sounds without midline pulsation or hepatomegaly.  Femoral pulses were 2+.  Distal pulses were intact.  There is no  clubbing, cyanosis or edema.  Neurological exam was grossly normal.  Skin was warm and dry.   Electrocardiogram dated [name] demonstrated atrial fibrillation at a rate  of 69 with intervals - 0.07/0.35.   IMPRESSION:  1. Atrial fibrillation with a controlled ventricular response.  2. Orthostatic intolerance/lightheadedness/dyspnea associated  with      number one.  3. Thromboembolic risk factors broadly negative.  4. Right-sided chest pain associated with number one but unassociated      with exertion otherwise.   Maurice [name], Maurice [name] is in atrial fibrillation with a relatively  controlled ventricular response and significant associated symptoms.  Restoration of sinus rhythm I think will be the best thing for symptom  amelioration.  Given the fact that it is Christmas Eve, we are little  bit of a quandary.  I have begun him on Coumadin with a plan to have him  come back on Monday for blood work and a transesophageal DC  cardioversion will be  scheduled for Tuesday with or without the need for  Lovenox support until his INR becomes therapeutic.   We had a long discussion regarding the hemodynamic effects of atrial  fibrillation and why it may be associated with his symptoms.  As I  reflect on this following his departure, I wonder whether some of his  worsening symptoms over the last couple of days may not  be aggravated  by relatively slow rates with atrial fibrillation; so, I plan to call  him and have his stop his diltiazem anticipation of next week as his  heart rate was only 91 when he saw you in your office.   As relates to thromboembolic risk, his Chad's-Vas score is 0; so  Coumadin would be appropriate as a transition to sinus rhythm.  Otherwise he should be able to be managed on aspirin.   As relates to longer-term strategies, frequency obviously will determine  our aggressiveness of therapy.  My thought at this point would be to  anticipate him coming to the hospital if he had another protracted  (greater than 12-hour) episode of atrial fibrillation at which time we  would give him oral flecainide at 300 mg to see if he could be converted  while and we was under telemetry observation.  In the event that were  successful, then using a cocktail at home would be appropriate.  In  anticipation of this strategy, he would be to have a Myoview scan to  make sure there is no occult coronary disease and this could be  scheduled after his TEE cardioversion next week.   We also need to make sure there are no medical contributors to the  above, for example hyperthyroidism and these blood assays will be  obtained next week as well.   Thanks very much for the consultation.   Maurice [name],    Sincerely,      Maurice Salvia, MD, [facility]  Electronically Signed    SCK/MedQ  DD: 10/06/2008  DT: 10/06/2008  Job #: [id number]

## 2011-02-26 NOTE — Assessment & Plan Note (Signed)
[facility]                         ELECTROPHYSIOLOGY OFFICE NOTE   NAME:Maurice [name] FALLS                  MRN:          [id number]  DATE:10/19/2008                            DOB:          1951-09-26    Maurice [name] is seen in followup for atrial fibrillation.  He was  cardioverted last Tuesday and unfortunately reverted to atrial  fibrillation yesterday.  This is quite discombobulating.  He is very  active physically.  His echo demonstrated no evidence of left  ventricular hypertrophy.  His INR today was elevated at 4.3.   OTHER MEDICATIONS:  1. Cardizem 180.  2. Vytorin 10/80.   ALLERGIES:  He has no known drug allergies.   PHYSICAL EXAMINATION:  VITAL SIGNS:  His blood pressure is 106/80; pulse  is 85, that was irregular; and weight is 180.  LUNGS:  Clear.  HEART:  Sounds were irregular with a 2/6 murmur.  ABDOMEN:  Soft.  EXTREMITIES:  Without edema.   Electrocardiogram demonstrated atrial fibrillation at 85 with intervals  - 0.07/0.35.  The axis was 85 degrees.   IMPRESSION:  1. Paroxysmal atrial fibrillation - recurrent status post      cardioversion 1 week ago.  2. INR - supratherapeutic at 4.3.  3. No known structural heart disease with normal left ventricular      function and normal left ventricular wall size by echo.   Maurice [name] is having a rapid recurrence of his atrial fibrillation.  We discussed the use of pill in the pocket as well as opposed to daily  antiarrhythmic drug therapy.  We discussed the role of flecainide, its  potential for side effects as well as potential for proarrhythmia.  Given the fact that he is very fit, I thought it is worth deferring his  stress test until after his cardioversion given the degree of his  symptoms.   To that end, we have decided to:  1. Begin him on flecainide 100 mg twice daily.  2. Continue him on his Cardizem 180.  3. Submit him for DC cardioversion on Friday.  4.  Submit him for stress Myoview targeting maximal heart rate as      opposed to 85% predicted max to assess both for ischemic burden as      well as for tachycardia and proarrhythmia.     Duke Salvia, MD, [facility]  Electronically Signed    SCK/MedQ  DD: 10/19/2008  DT: 10/20/2008  Job #: [id number]

## 2011-02-26 NOTE — Cardiovascular Report (Signed)
NAMENICK, STULTS NO.:  [id number]   MEDICAL RECORD NO.:  [id number]          PATIENT TYPE:  AMB   LOCATION:  ENDO                         FACILITY:  [facility]   PHYSICIAN:  Marca Ancona, MD      DATE OF BIRTH:  July 23, 1951   DATE OF PROCEDURE:  DATE OF DISCHARGE:                            CARDIAC CATHETERIZATION   CARDIOLOGIST:  Duke Salvia, MD, [facility]   PROCEDURE:  Transesophageal echocardiography with cardioversion.   INDICATION:  Symptomatic atrial fibrillation.   PROCEDURE:  The patient gave informed consent for the TEE cardioversion  prior to the procedure.  He has been on Coumadin for several days and  had a dose of Lovenox last night and this morning.  His INR is 1.9  today.  TEE was performed.  Please see separate dictation for the TEE.  There was no left atrial or left atrial appendage thrombus. Therefore,  we proceeded directly to the cardioversion.  The patient was sedated  using propofol by Anesthesia.  He then underwent synchronized direct  current cardioversion at 200 joules.  He was converted to normal sinus  rhythm with the first attempt.  He maintained sinus rhythm after the  procedure.  He will need to continue to take Lovenox until his INR is  therapeutic.  He will come in and get his INR drawn tomorrow at [facility].      Marca Ancona, MD  Electronically Signed     DM/MEDQ  D:  10/11/2008  T:  10/12/2008  Job:  [id number]   cc:   Duke Salvia, MD, [facility]

## 2011-02-26 NOTE — Consult Note (Signed)
[name], SCHOENBERGER NO.:  [id number]   MEDICAL RECORD NO.:  [id number]          PATIENT TYPE:  OIB   LOCATION:  2899                         FACILITY:  [facility]   PHYSICIAN:  Pricilla Riffle, MD, FACCDATE OF BIRTH:  [birth year]   DATE OF CONSULTATION:  10/21/2008  DATE OF DISCHARGE:                                 CONSULTATION   IDENTIFICATION:  [name] is a 60 year old gentleman who presents  today for cardioversion.  He has atrial fibrillation.   Yesterday, he began his first doses of flecainide at 100 mg b.i.d.   EKG this morning shows sinus bradycardia at a rate of 56 beats per  minute, first-degree AV block with a PR interval of 216 milliseconds.  There is evidence of left atrial abnormality.   The procedure was cancelled.  The patient has a Myoview scheduled for  Monday, which he will go ahead and proceed with and follow up with Dr.  Graciela Husbands.      Pricilla Riffle, MD, [facility]  Electronically Signed     PVR/MEDQ  D:  10/21/2008  T:  10/21/2008  Job:  [id number]

## 2011-03-01 NOTE — Discharge Summary (Signed)
Maurice [name]                     ACCOUNT NO.:  [id number]   MEDICAL RECORD NO.:  [id number]                   PATIENT TYPE:  INP   LOCATION:  3037                                 FACILITY:  [facility]   PHYSICIAN:  Pearlean Brownie, M.D.            DATE OF BIRTH:  [birth year]   DATE OF ADMISSION:  04/02/2003  DATE OF DISCHARGE:  04/05/2003                                 DISCHARGE SUMMARY   DISCHARGE DIAGNOSES:  1. Acute diverticulitis.  2. Hypercholesterolemia.  3. Hypokalemia.   DISCHARGE MEDICATIONS:  1. Augmentin 500/125 mg one tablet p.o. q.8h. x 7 days.  2. Tylenol No. 3 one tablet p.o. q.6h. p.r.n. moderate to severe pain, #5     tablets with no refills.  3. Zocor 40 mg p.o. daily.  4. Multivitamins one tablet p.o. daily.  5. Tylenol 650 mg p.o. q.6h. p.r.n. mild pain.   ACTIVITY:  As tolerated.   DIET:  Gradually increase your diet to regular.  Increase the fiber in your  diet to help reduce the risk of diverticulitis.   SPECIAL INSTRUCTIONS:  Call your doctor or seek medical care if you develop  worsening abdominal pain, fever, vomiting, or other problems.   FOLLOWUP:  Follow up as needed with your doctor.  We recommend colonoscopy  in one to two months.   BRIEF ADMISSION HISTORY:  Maurice [name] is a 60 year old male with a  history of hypercholesterolemia who was in his normal state of health until  about two days prior to admission when he developed dull lower abdominal  pain, left lower quadrant greater than right lower quadrant.  He also had  some nausea without emesis, but no diarrhea.  On the morning of the day  prior to admission, the patient was seen at [facility] where he was  afebrile and had a normal urinalysis, but did have an elevated white count  of 13.9 with 76% granulocytes.  He was felt to be constipated and was given  MiraLax and Fleets prescriptions.  The patient went home and used these  medications, but had worsening  pain, as well as development of fever and  chills and worsening nausea without emesis.  The [facility] returned to [facility] on the day prior to admission and was found to have a  temperature of 100.6 degrees, an increased white count of 15 with 79%  granulocytes, and per [facility] report, air/fluid levels on the  abdominal x-ray.  The patient was sent to [facility] for a CT scan of the  abdomen and pelvis with contrast, which showed diverticulitis.  The patient  was admitted for IV antibiotics.  On admission, the patient was afebrile  with a temperature of 97.3 degrees, blood pressure 122/81, heart rate 85, O2  saturation 98%, and respiratory rate 20.  The abdomen was soft with normal  bowel sounds.  No guarding or rebound, but moderate tenderness to palpation  of bilateral lower quadrants, left greater than right.  For the remainder of  the admission history and physical, please see the dictated note.   CONSULTS:  None.   STUDIES:  CT of the abdomen and pelvis with contrast, which showed sigmoid  diverticulitis.   HOSPITAL COURSE:  #1 - ACUTE DIVERTICULOSIS:  The patient was made NPO,  started on IV Unasyn, and given Tylenol as needed for fever, morphine IV as  needed for pain, and IV fluids.  A rectal exam was done which showed guaiac-  negative stools and soft brown stool in the vault.  The patient's diet was  slowly advanced secondary to continued pain and a decreased appetite.  For  the first several days of hospitalization, he was only able to tolerate sips  secondary to watery stools following any p.o. intake.  On hospital day #3,  the patient was better able to tolerate water and ice chips.  He was  therefore advanced to a clear liquid diet.  By the day of discharge, the  patient was able to tolerate a soft diet.  He had remained afebrile  throughout his hospitalization.  He had no abdominal tenderness.  During his  stay, he was switched from IV Unasyn to  Augmentin.  He was discharged home  on Augmentin to complete a 10-day course.  He was encouraged to increase the  fiber in his diet and to have a colonoscopy in one to two months.  Also  during his hospitalization, the patient was switched from IV morphine to  Tylenol No. 3 for pain control, which he rarely has used.  During his stay,  his diarrhea also resolved.   #2 - HYPERCHOLESTEROLEMIA:  The patient's Zocor was held while he was NPO,  but it was restarted at the time of discharge.  LFTs were within normal  limits during the hospitalization.  The cholesterol should continue to be  followed as an outpatient.   #3 - HYPOKALEMIA:  On admission, the patient's potassium was low at 3.3.  The patient received p.o. supplementation with some elevation of his  potassium so that on the day of discharge his potassium was 4.1.  His  hypokalemia was likely secondary to his diarrhea.   DISCHARGE LABORATORY DATA:  Serum sodium 141, potassium 4.1, chloride 108,  bicarbonate 27, glucose 98, BUN 8, creatinine 1.1, calcium 9.0, magnesium  2.2.     Georgina Peer, M.D.                 Pearlean Brownie, M.D.    JM/MEDQ  D:  04/20/2003  T:  04/21/2003  Job:  [id number]   cc:   Sharlot Gowda, M.D.  [address]  [place], [place] [address]  Fax: [phone]    cc:   Sharlot Gowda, M.D.  [address] [place], [place] [address]  Fax: [phone]

## 2011-03-01 NOTE — H&P (Signed)
Maurice [name], Maurice [name]                     ACCOUNT NO.:  [id number]   MEDICAL RECORD NO.:  [id number]                   PATIENT TYPE:  INP   LOCATION:  3037                                 FACILITY:  [facility]   PHYSICIAN:  Pearlean Brownie, M.D.            DATE OF BIRTH:  1951/05/05   DATE OF ADMISSION:  04/02/2003  DATE OF DISCHARGE:                                HISTORY & PHYSICAL   CHIEF COMPLAINT:  Lower abdominal pain, fever, and chills.   PRIMARY CARE PHYSICIAN:  [facility], [address]   HPI:  The patient is a 60 year old white male with a history of  hypercholesterolemia who was in his normal state of health until about two  days prior to admission when he developed dull lower abdominal pain, left  lower quadrant greater than right lower quadrant.  The patient also had  associated nausea without emesis and no diarrhea.  The patient thought it  was secondary to abdominal exercises he had done.  He was able to go to work  but was unable to sleep on the night prior to admission secondary to  worsening pain.  He went to [facility] yesterday morning (April 01, 2003) where he was found to be afebrile with a normal UA (specific gravity  1.030, pH 5 and negative) but an elevated white count of 13.9 with 76%  granulocytes, 15% lymphocytes.  He was thought to be constipated and  prescribed MiraLax and Fleet enemas.  The patient went home, but the pain  worsened with use of these medicines.  He also developed subjective fever  and chills, worsened nausea, without emesis.  He returned to [facility] later on the day prior to admission where he was found to have a  temperature of 100.6, a higher white count of 15, with 79% granulocytes, 12%  lymphocytes and, per the records from [facility], air fluid levels on  abdominal x-ray.  The patient was sent to [facility] for CT scan of  the abdomen and pelvis with contrast which showed  diverticulitis.  The  patient was admitted for IV antibiotic therapy.   PAST MEDICAL HISTORY:  Hypercholesterolemia.   PAST SURGICAL HISTORY:  None.   MEDICATIONS:  1. Zocor 40 mg p.o. daily.  2. Multivitamin 1 p.o. daily.   ALLERGIES:  No known drug allergies.   SOCIAL HISTORY:  The patient is single.  Lives with a roommate and five dogs  and three cats.  He is employed as a Marine scientist and a bookkeeper.  He  denies any tobacco use.  Does drink an occasional beer on the weekends and  reports a distant history of illicit drug use (marijuana).  He has a father  and brother who live in [place].   FAMILY HISTORY:  Mother died at age 33, dropped dead with possible cardiac  arrhythmia.  His dad is living at age 4, with hypertension.  He has one  brother with hypertension and high cholesterol and another brother who is  healthy.  He has no children.   REVIEW OF SYSTEMS:  The patient denies chest pain, shortness of breath,  bright red blood per rectum, or blood with bowel movements, urinary  complaints (hematuria, dysuria, frequency, urgency).  He describes his  typical bowel movements as loose/diarrhea.  He has had three or four  episodes of loose bowel movements in the emergency department, status post  p.o. contrast; no blood or mucous with these bowel movements.  He does not  eat red meat or fish.  He has no history of similar symptoms.  He does  report a decreased appetite x1 day.   PHYSICAL EXAMINATION:  VITAL SIGNS:  Weight 170 pounds, which is 77 kg,  height 78 inches.  Vital signs pending.  GENERAL:  Alert and oriented x4.  Pleasant.  In mild discomfort with sitting  up and lying down.  Otherwise in no acute distress.  HEENT:  PERRL.  EOMI.  Moist mucous membranes.  OP clear, without erythema  or lesions.  Good dentition.  Sclerae white.  NECK:  Supple, no lymphadenopathy.  CARDIOVASCULAR:  Regular rate and rhythm.  No murmurs, rubs, or gallops.  Normal PMI, 2+ and  symmetrical DP and radial pulses.  LUNGS:  Clear to auscultation bilaterally, with good effort.  No wheezes,  rhonchi, or rales.  ABDOMEN:  Normal bowel sounds, soft.  No guarding or rebound.  There is  moderate tenderness to palpation bilateral lower quadrants, left lower  quadrant greater than right lower quadrant.  EXTREMITIES:  No clubbing, cyanosis, or edema.  Moves all extremities  symmetrically.  RECTAL:  Pending.   LABORATORIES:  See HPI.  CT of the abdomen and pelvis with contrast showed  sigmoid diverticulitis.   ASSESSMENT AND PLAN:  The patient is a 60 year old white male with a two-day  history of worsening lower abdominal pain, left greater than right, fever,  chills and nausea.   1. Diverticulitis.  Seen on CT scan and consistent with fevers, chills,     nausea, abdominal pain, and elevated white count.  Admitted patient for     IV antibiotics (Unasyn).  The patient made n.p.o.  Given Tylenol as     needed for fever, morphine as needed for pain, and IV fluids.  Consider     changing to sips of clears in the morning and continuing to advance the     diet as tolerated.  Also will need to change to p.o. antibiotics and pain     medications as the patient improves.  Check a CBC with differential and a     CMP in the morning.  2. Hypercholesterolemia:  Holding the patient's Zocor while he is n.p.o.  3. Fluids, electrolytes, and nutrition:  The patient n.p.o.  Will receive     500 mL bolus, then IV fluids though he does not appear dry.  Check in's     and out's.  Advance diet in the morning as tolerated.  4. Disposition:  Possible discharge in the next day or two on p.o.     antibiotics if pain decreased and patient can tolerate p.o.     Georgina Peer, M.D.                 Pearlean Brownie, M.D.    JM/MEDQ  D:  04/02/2003  T:  04/03/2003  Job:  [id number]   cc:   [facility]  [address]   cc:   [facility]  [address]

## 2011-03-01 NOTE — Op Note (Signed)
   NAMEANTONIO, Maurice [name]                     ACCOUNT NO.:  [id number]   MEDICAL RECORD NO.:  [id number]                   PATIENT TYPE:  AMB   LOCATION:  ENDO                                 FACILITY:  [facility]   PHYSICIAN:  Georgiana Spinner, M.D.                 DATE OF BIRTH:  Apr 25, 1951   DATE OF PROCEDURE:  07/26/2003  DATE OF DISCHARGE:                                 OPERATIVE REPORT   PROCEDURE:  Colonoscopy.   ENDOSCOPIST:  Georgiana Spinner, M.D.   INDICATIONS:  History of diverticulitis.  Colonoscopy is done for colon  cancer screening to rule out mass lesion in the sigmoid colon.   ANESTHESIA:  Demerol 80 mg, Versed 8 mg.   PROCEDURE:  With the patient mildly sedated in the left lateral decubitus  position, a rectal examination was performed which was unremarkable.  Subsequently, the Olympus videoscopic colonoscope was inserted in the rectum  and passed through a very tortuous colon to the cecum, identified by  ileocecal valve and appendiceal orifice.  We were able to get into the  terminal ileum, which appeared normal, and was photographed as well.  The  endoscope was then withdrawn, taking circumferential views of terminal ileum  and colonic mucosa, stopping only in the rectum, which appeared normal on  direct and retroflexed view.  The endoscope was straightened and withdrawn.  The patient's vital signs and pulse oximetry remained stable.  The patient  tolerated the procedure well without apparent complications.   FINDINGS:  Unremarkable examination with some mild but very unprominent  diverticulosis of the sigmoid colon, no other lesions noted.   PLAN:  Followup with me in 5 to 10 years or as needed.                                                Georgiana Spinner, M.D.    GMO/MEDQ  D:  07/26/2003  T:  07/26/2003  Job:  [id number]   cc:   Sharlot Gowda, M.D.  [address] [place], [place] [address]  Fax: [phone]

## 2011-05-10 ENCOUNTER — Encounter: Payer: Self-pay | Admitting: Internal Medicine

## 2011-05-15 ENCOUNTER — Ambulatory Visit (INDEPENDENT_AMBULATORY_CARE_PROVIDER_SITE_OTHER): Payer: PRIVATE HEALTH INSURANCE | Admitting: Internal Medicine

## 2011-05-15 ENCOUNTER — Encounter: Payer: Self-pay | Admitting: Internal Medicine

## 2011-05-15 VITALS — BP 131/83 | HR 61 | Resp 12 | Ht 69.0 in | Wt 175.0 lb

## 2011-05-15 DIAGNOSIS — I4891 Unspecified atrial fibrillation: Secondary | ICD-10-CM

## 2011-05-15 NOTE — Patient Instructions (Signed)
Your physician wants you to follow-up in: 12 months with Dr Allred You will receive a reminder letter in the mail two months in advance. If you don't receive a letter, please call our office to schedule the follow-up appointment.  

## 2011-05-15 NOTE — Progress Notes (Signed)
The patient presents today for routine electrophysiology followup.  Since last being seen in our clinic, the patient reports doing very well.  He has had no afib since his ablation.  Today, he denies symptoms of palpitations, chest pain, shortness of breath, orthopnea, PND, lower extremity edema, dizziness, presyncope, syncope, or neurologic sequela.  The patient feels that he is tolerating medications without difficulties and is otherwise without complaint today.   Past Medical History  Diagnosis Date  . Atrial fibrillation     paroxysmal s/p PVI by JA  . Diverticulosis   . Hypercholesterolemia    Past Surgical History  Procedure Date  . Colonoscopy 2004  . Atrail fibrillation ablation 7/10    PVI by JA    Current Outpatient Prescriptions  Medication Sig Dispense Refill  . aspirin EC 325 MG tablet Take 325 mg by mouth daily.        Marland Kitchen atorvastatin (LIPITOR) 40 MG tablet Take 40 mg by mouth daily.        . multivitamin (THERAGRAN) per tablet Take 1 tablet by mouth daily.          No Known Allergies  History   Social History  . Marital Status: Single    Spouse Name: N/A    Number of Children: N/A  . Years of Education: N/A   Occupational History  . Not on file.   Social History Main Topics  . Smoking status: Never Smoker   . Smokeless tobacco: Not on file   Comment: denies   . Alcohol Use: No  . Drug Use: No  . Sexually Active: Not on file   Other Topics Concern  . Not on file   Social History Narrative  . No narrative on file    Family History  Problem Relation Age of Onset  . Hypertension Father   . Hypertension Brother   . Hyperlipidemia Brother    Physical Exam: Filed Vitals:   05/15/11 0938  BP: 131/83  Pulse: 61  Resp: 12  Height: 5\' 9"  (1.753 m)  Weight: 175 lb (79.379 kg)    GEN- The patient is well appearing, alert and oriented x 3 today.   Head- normocephalic, atraumatic Eyes-  Sclera clear, conjunctiva pink Ears- hearing  intact Oropharynx- clear Neck- supple, no JVP Lymph- no cervical lymphadenopathy Lungs- Clear to ausculation bilaterally, normal work of breathing Heart- Regular rate and rhythm, no murmurs, rubs or gallops, PMI not laterally displaced GI- soft, NT, ND, + BS Extremities- no clubbing, cyanosis, or edema MS- no significant deformity or atrophy Skin- no rash or lesion Psych- euthymic mood, full affect Neuro- strength and sensation are intact  ekg today reveals sinus rhythm 68 bpm, otherwise normal ekg  Assessment and Plan:

## 2011-05-15 NOTE — Assessment & Plan Note (Signed)
Maintaining sinus rhythm off of AAD ASA 81mg  daily (CHADSVASC score is 0) Return in 12 months

## 2011-10-21 ENCOUNTER — Other Ambulatory Visit: Payer: Self-pay | Admitting: Family Medicine

## 2011-10-23 ENCOUNTER — Other Ambulatory Visit: Payer: Self-pay | Admitting: Family Medicine

## 2011-10-23 ENCOUNTER — Telehealth: Payer: Self-pay | Admitting: Internal Medicine

## 2011-10-23 MED ORDER — ATORVASTATIN CALCIUM 40 MG PO TABS: 40.0000 mg | ORAL_TABLET | Freq: Every day | ORAL | Status: DC

## 2011-10-23 NOTE — Telephone Encounter (Signed)
Gave a 30 day refill so pt wont run out. Pt is coming in for phsyical on January 17 @ 2:00pm

## 2011-10-31 ENCOUNTER — Encounter: Payer: Self-pay | Admitting: Family Medicine

## 2011-10-31 ENCOUNTER — Encounter: Payer: PRIVATE HEALTH INSURANCE | Admitting: Family Medicine

## 2011-10-31 ENCOUNTER — Ambulatory Visit (INDEPENDENT_AMBULATORY_CARE_PROVIDER_SITE_OTHER): Payer: PRIVATE HEALTH INSURANCE | Admitting: Family Medicine

## 2011-10-31 DIAGNOSIS — I10 Essential (primary) hypertension: Secondary | ICD-10-CM

## 2011-10-31 DIAGNOSIS — K5792 Diverticulitis of intestine, part unspecified, without perforation or abscess without bleeding: Secondary | ICD-10-CM

## 2011-10-31 DIAGNOSIS — K5732 Diverticulitis of large intestine without perforation or abscess without bleeding: Secondary | ICD-10-CM

## 2011-10-31 DIAGNOSIS — I4891 Unspecified atrial fibrillation: Secondary | ICD-10-CM

## 2011-10-31 DIAGNOSIS — Z8042 Family history of malignant neoplasm of prostate: Secondary | ICD-10-CM | POA: Insufficient documentation

## 2011-10-31 DIAGNOSIS — E785 Hyperlipidemia, unspecified: Secondary | ICD-10-CM

## 2011-10-31 LAB — COMPREHENSIVE METABOLIC PANEL
ALT: 37 U/L (ref 0–53)
CO2: 23 mEq/L (ref 19–32)
Chloride: 107 mEq/L (ref 96–112)
Sodium: 139 mEq/L (ref 135–145)
Total Bilirubin: 1.1 mg/dL (ref 0.3–1.2)
Total Protein: 6.2 g/dL (ref 6.0–8.3)

## 2011-10-31 LAB — LIPID PANEL
LDL Cholesterol: 120 mg/dL — ABNORMAL HIGH (ref 0–99)
Total CHOL/HDL Ratio: 4.3 Ratio
VLDL: 24 mg/dL (ref 0–40)

## 2011-10-31 NOTE — Progress Notes (Signed)
  Subjective:    Patient ID: Maurice Maurice [name], male    DOB: [birth year], 61 y.o.   MRN: [id number]  HPI He is here for complete examination. He has no particular concerns or complaints. He continues on medications listed in the chart. He has had no more difficulty with irregular heartbeat. He said no, pain, nausea, vomiting. His work is quite well. His home life is quite stable and he is happy.   Review of Systems  Constitutional: Negative.   HENT: Negative.   Eyes: Negative.   Respiratory: Negative.   Cardiovascular: Negative.   Gastrointestinal: Negative.   Genitourinary: Negative.   Musculoskeletal: Negative.   Skin: Negative.   Neurological: Negative.   Psychiatric/Behavioral: Negative.        Objective:   Physical Exam BP 120/78  Pulse 64  Ht 5\' 7"  (1.702 m)  Wt 177 lb (80.287 kg)  BMI 27.72 kg/m2  General Appearance:    Alert, cooperative, no distress, appears stated age  Head:    Normocephalic, without obvious abnormality, atraumatic  Eyes:    PERRL, conjunctiva/corneas clear, EOM's intact, fundi    benign  Ears:    Normal TM's and external ear canals  Nose:   Nares normal, mucosa normal, no drainage or sinus   tenderness  Throat:   Maurice [name], mucosa, and tongue normal; teeth and gums normal  Neck:   Supple, no lymphadenopathy;  thyroid:  no   enlargement/tenderness/nodules; no carotid   bruit or JVD  Back:    Spine nontender, no curvature, ROM normal, no CVA     tenderness  Lungs:     Clear to auscultation bilaterally without wheezes, rales or     ronchi; respirations unlabored  Chest Wall:    No tenderness or deformity   Heart:    Regular rate and rhythm, S1 and S2 normal, no murmur, rub   or gallop  Breast Exam:    No chest wall tenderness, masses or gynecomastia  Abdomen:     Soft, non-tender, nondistended, normoactive bowel sounds,    no masses, no hepatosplenomegaly  Genitalia:    Normal male external genitalia without lesions.  Testicles without masses.  No  inguinal hernias.  Rectal:    Normal sphincter tone, no masses or tenderness; guaiac negative stool.  Prostate smooth, no nodules, not enlarged.  Extremities:   No clubbing, cyanosis or edema  Pulses:   2+ and symmetric all extremities  Skin:   Skin color, texture, turgor normal, no rashes or lesions  Lymph nodes:   Cervical, supraclavicular, and axillary nodes normal  Neurologic:   CNII-XII intact, normal strength, sensation and gait; reflexes 2+ and symmetric throughout          Psych:   Normal mood, affect, hygiene and grooming.           Assessment & Plan:   1. Hyperlipemia  Lipid panel, CBC w/Diff, Comp Met (CMET)  2. Atrial fibrillation    3. Diverticulitis  POCT Hemoccult (POC) Blood/Stool Test  4. Essential hypertension, benign    5. HYPERLIPIDEMIA    6. Family history of prostate cancer  PSA

## 2011-11-01 LAB — CBC WITH DIFFERENTIAL/PLATELET
Eosinophils Absolute: 0.1 10*3/uL (ref 0.0–0.7)
Lymphocytes Relative: 46 % (ref 12–46)
Lymphs Abs: 2.4 10*3/uL (ref 0.7–4.0)
Neutrophils Relative %: 40 % — ABNORMAL LOW (ref 43–77)
Platelets: 212 10*3/uL (ref 150–400)
RBC: 5.29 MIL/uL (ref 4.22–5.81)
WBC: 5.2 10*3/uL (ref 4.0–10.5)

## 2011-11-01 LAB — PSA: PSA: 0.23 ng/mL (ref <=4.00)

## 2011-12-10 ENCOUNTER — Other Ambulatory Visit: Payer: Self-pay | Admitting: Family Medicine

## 2011-12-31 ENCOUNTER — Other Ambulatory Visit: Payer: Self-pay | Admitting: Family Medicine

## 2012-03-02 ENCOUNTER — Ambulatory Visit (INDEPENDENT_AMBULATORY_CARE_PROVIDER_SITE_OTHER): Payer: PRIVATE HEALTH INSURANCE | Admitting: Internal Medicine

## 2012-03-02 ENCOUNTER — Encounter: Payer: Self-pay | Admitting: Internal Medicine

## 2012-03-02 VITALS — BP 126/88 | HR 63 | Ht 69.0 in | Wt 184.4 lb

## 2012-03-02 DIAGNOSIS — E785 Hyperlipidemia, unspecified: Secondary | ICD-10-CM

## 2012-03-02 DIAGNOSIS — I4891 Unspecified atrial fibrillation: Secondary | ICD-10-CM

## 2012-03-02 DIAGNOSIS — I10 Essential (primary) hypertension: Secondary | ICD-10-CM

## 2012-03-02 NOTE — Assessment & Plan Note (Signed)
Though he has done very well since his afib ablation in 2010, he has begun having palpitations. I will place an event monitor to better characterize these palpitations.  I will also obtain an echo to evaluate for structural changes.  He will return for follow-up in 2 months.  We will defer further management until the event monitor results are reviewed. If he has had recurrence of afib, then our options would be an antiarrhythmic medicine (such as multaq) or repeat ablation.  He took flecainide previously but did not tolerate this medicine very well due to fatigue.

## 2012-03-02 NOTE — Progress Notes (Signed)
The patient presents today for routine electrophysiology followup.  He reports that over the past few months, he has developed recurrent palpitations.  These last several hours and then leaves him feeling "washed out".  He suspects that he may be having recurrent afib.  Today, he denies symptoms of chest pain, shortness of breath, orthopnea, PND, lower extremity edema, dizziness, presyncope, syncope, or neurologic sequela.  The patient feels that he is tolerating medications without difficulties and is otherwise without complaint today.   Past Medical History  Diagnosis Date  . Atrial fibrillation     paroxysmal s/p PVI by JA  . Diverticulosis   . Hypercholesterolemia    Past Surgical History  Procedure Date  . Colonoscopy 2004  . Atrail fibrillation ablation 7/10    PVI by JA    Current Outpatient Prescriptions  Medication Sig Dispense Refill  . acyclovir (ZOVIRAX) 400 MG tablet TAKE 1 TABLET BY MOUTH THREE TIMES A DAY AS NEEDED  30 tablet  5  . aspirin 81 MG tablet Take 160 mg by mouth daily.      Marland Kitchen atorvastatin (LIPITOR) 40 MG tablet TAKE 1 TABLET EVERY DAY  30 tablet  5  . multivitamin (THERAGRAN) per tablet Take 1 tablet by mouth daily.          No Known Allergies  History   Social History  . Marital Status: Single    Spouse Name: N/A    Number of Children: N/A  . Years of Education: N/A   Occupational History  . Not on file.   Social History Main Topics  . Smoking status: Never Smoker   . Smokeless tobacco: Not on file   Comment: denies   . Alcohol Use: 0.6 oz/week    1 Cans of beer per week  . Drug Use: No  . Sexually Active: Yes   Other Topics Concern  . Not on file   Social History Narrative  . No narrative on file    Family History  Problem Relation Age of Onset  . Hypertension Father   . Hypertension Brother   . Hyperlipidemia Brother   . Stroke Mother    Physical Exam: Filed Vitals:   03/02/12 1248  BP: 126/88  Pulse: 63  Height: 5\' 9"   (1.753 m)  Weight: 184 lb 6.4 oz (83.643 kg)    GEN- The patient is well appearing, alert and oriented x 3 today.   Head- normocephalic, atraumatic Eyes-  Sclera clear, conjunctiva pink Ears- hearing intact Oropharynx- clear Neck- supple, no JVP Lymph- no cervical lymphadenopathy Lungs- Clear to ausculation bilaterally, normal work of breathing Heart- Regular rate and rhythm, no murmurs, rubs or gallops, PMI not laterally displaced GI- soft, NT, ND, + BS Extremities- no clubbing, cyanosis, or edema  ekg today reveals sinus rhythm 63 bpm, LAA, RsR'  Assessment and Plan:

## 2012-03-02 NOTE — Assessment & Plan Note (Signed)
Stable No change required today  

## 2012-03-02 NOTE — Patient Instructions (Signed)
Your physician recommends that you schedule a follow-up appointment in: 2 months with Dr Johney Frame  Your physician has requested that you have an echocardiogram. Echocardiography is a painless test that uses sound waves to create images of your heart. It provides your doctor with information about the size and shape of your heart and how well your heart's chambers and valves are working. This procedure takes approximately one hour. There are no restrictions for this procedure.  Your physician has recommended that you wear an event monitor. Event monitors are medical devices that record the heart's electrical activity. Doctors most often [place] these monitors to diagnose arrhythmias. Arrhythmias are problems with the speed or rhythm of the heartbeat. The monitor is a small, portable device. You can wear one while you do your normal daily activities. This is usually used to diagnose what is causing palpitations/syncope (passing out).---[facility]

## 2012-03-17 ENCOUNTER — Ambulatory Visit (HOSPITAL_COMMUNITY): Payer: PRIVATE HEALTH INSURANCE | Attending: Cardiovascular Disease | Admitting: Radiology

## 2012-03-17 ENCOUNTER — Encounter (INDEPENDENT_AMBULATORY_CARE_PROVIDER_SITE_OTHER): Payer: PRIVATE HEALTH INSURANCE

## 2012-03-17 DIAGNOSIS — I359 Nonrheumatic aortic valve disorder, unspecified: Secondary | ICD-10-CM | POA: Insufficient documentation

## 2012-03-17 DIAGNOSIS — E785 Hyperlipidemia, unspecified: Secondary | ICD-10-CM | POA: Insufficient documentation

## 2012-03-17 DIAGNOSIS — I4891 Unspecified atrial fibrillation: Secondary | ICD-10-CM | POA: Insufficient documentation

## 2012-03-17 DIAGNOSIS — R002 Palpitations: Secondary | ICD-10-CM | POA: Insufficient documentation

## 2012-03-17 DIAGNOSIS — I517 Cardiomegaly: Secondary | ICD-10-CM | POA: Insufficient documentation

## 2012-03-17 DIAGNOSIS — I519 Heart disease, unspecified: Secondary | ICD-10-CM | POA: Insufficient documentation

## 2012-03-17 DIAGNOSIS — I10 Essential (primary) hypertension: Secondary | ICD-10-CM | POA: Insufficient documentation

## 2012-03-17 NOTE — Progress Notes (Signed)
Echocardiogram performed.  

## 2012-05-13 ENCOUNTER — Ambulatory Visit (INDEPENDENT_AMBULATORY_CARE_PROVIDER_SITE_OTHER): Payer: PRIVATE HEALTH INSURANCE | Admitting: Internal Medicine

## 2012-05-13 ENCOUNTER — Encounter: Payer: Self-pay | Admitting: Internal Medicine

## 2012-05-13 VITALS — BP 118/82 | HR 66 | Resp 18 | Ht 69.0 in | Wt 183.8 lb

## 2012-05-13 DIAGNOSIS — I4891 Unspecified atrial fibrillation: Secondary | ICD-10-CM

## 2012-05-13 MED ORDER — DILTIAZEM HCL 30 MG PO TABS: ORAL_TABLET | ORAL | Status: DC

## 2012-05-13 NOTE — Progress Notes (Signed)
PCP: Carollee Herter, MD   The patient presents today for routine electrophysiology followup.  Recent event monitor documents short episodes of recurrent afib.  He feels that episodes have slightly improved.  He describes a sense of anxiety with palpitations during afib.  Today, he denies symptoms of chest pain, shortness of breath, orthopnea, PND, lower extremity edema, dizziness, presyncope, syncope, or neurologic sequela.  The patient feels that he is tolerating medications without difficulties and is otherwise without complaint today.   Past Medical History  Diagnosis Date  . Atrial fibrillation     paroxysmal s/p PVI by JA  . Diverticulosis   . Hypercholesterolemia    Past Surgical History  Procedure Date  . Colonoscopy 2004  . Atrail fibrillation ablation 7/10    PVI by JA    Current Outpatient Prescriptions  Medication Sig Dispense Refill  . acyclovir (ZOVIRAX) 400 MG tablet TAKE 1 TABLET BY MOUTH THREE TIMES A DAY AS NEEDED  30 tablet  5  . aspirin 81 MG tablet Take 160 mg by mouth daily.      Marland Kitchen atorvastatin (LIPITOR) 40 MG tablet TAKE 1 TABLET EVERY DAY  30 tablet  5  . multivitamin (THERAGRAN) per tablet Take 1 tablet by mouth daily.        Marland Kitchen diltiazem (CARDIZEM) 30 MG tablet Take one tablet by mouth every 6 hours as needed  30 tablet  1    No Known Allergies  History   Social History  . Marital Status: Single    Spouse Name: N/A    Number of Children: N/A  . Years of Education: N/A   Occupational History  . Not on file.   Social History Main Topics  . Smoking status: Never Smoker   . Smokeless tobacco: Not on file   Comment: denies   . Alcohol Use: 0.6 oz/week    1 Cans of beer per week  . Drug Use: No  . Sexually Active: Yes   Other Topics Concern  . Not on file   Social History Narrative  . No narrative on file    Family History  Problem Relation Age of Onset  . Hypertension Father   . Hypertension Brother   . Hyperlipidemia Brother   .  Stroke Mother    Physical Exam: Filed Vitals:   05/13/12 1428  BP: 118/82  Pulse: 66  Resp: 18  Height: 5\' 9"  (1.753 m)  Weight: 183 lb 12.8 oz (83.371 kg)  SpO2: 96%    GEN- The patient is well appearing, alert and oriented x 3 today.   Head- normocephalic, atraumatic Eyes-  Sclera clear, conjunctiva pink Ears- hearing intact Oropharynx- clear Neck- supple, no JVP Lymph- no cervical lymphadenopathy Lungs- Clear to ausculation bilaterally, normal work of breathing Heart- Regular rate and rhythm, no murmurs, rubs or gallops, PMI not laterally displaced GI- soft, NT, ND, + BS Extremities- no clubbing, cyanosis, or edema  ekg today reveals sinus rhythm 67bpm   Assessment and Plan:

## 2012-05-13 NOTE — Assessment & Plan Note (Signed)
Recent event monitor documents recurrent afib, though episodes are presently well tolerated. He will take cardizem 30mg  prn during afib. If afib worsens, then we will consider AAD (multaq) vs ablation  Continue ASA for CHADSVasc score of 1.  Return in 3 months

## 2012-05-13 NOTE — Patient Instructions (Addendum)
Your physician recommends that you schedule a follow-up appointment in: 3 months with Dr Johney Frame   Your physician has recommended you make the following change in your medication:  1) Take Diltiazem 30mg  1 tablet every 6 hours as needed

## 2012-06-04 ENCOUNTER — Telehealth: Payer: Self-pay | Admitting: Internal Medicine

## 2012-06-04 MED ORDER — DILTIAZEM HCL ER COATED BEADS 120 MG PO CP24: 120.0000 mg | ORAL_CAPSULE | Freq: Every day | ORAL | Status: DC

## 2012-06-04 NOTE — Telephone Encounter (Signed)
Discussed with Dr Johney Frame  He can start Diltiazem 120mg  daily and see if this helps with his symptoms.  He will call back and let me know

## 2012-06-04 NOTE — Telephone Encounter (Signed)
Please return call to patient [phone] regarding A-Fib and medication.

## 2012-08-12 ENCOUNTER — Encounter: Payer: Self-pay | Admitting: Internal Medicine

## 2012-08-12 ENCOUNTER — Ambulatory Visit (INDEPENDENT_AMBULATORY_CARE_PROVIDER_SITE_OTHER): Payer: PRIVATE HEALTH INSURANCE | Admitting: Internal Medicine

## 2012-08-12 VITALS — BP 119/72 | HR 72 | Ht 69.0 in | Wt 186.0 lb

## 2012-08-12 DIAGNOSIS — I4891 Unspecified atrial fibrillation: Secondary | ICD-10-CM

## 2012-08-12 NOTE — Progress Notes (Signed)
PCP: Carollee Herter, MD   The patient presents today for routine electrophysiology followup.  He continues to have intermittent afib, though this is improved with diltiazem.  He reports fatigue with diltiazem.   Today, he denies symptoms of chest pain, shortness of breath, orthopnea, PND, lower extremity edema, dizziness, presyncope, syncope, or neurologic sequela.    Past Medical History  Diagnosis Date  . Atrial fibrillation     paroxysmal s/p PVI by JA  . Diverticulosis   . Hypercholesterolemia    Past Surgical History  Procedure Date  . Colonoscopy 2004  . Atrail fibrillation ablation 7/10    PVI by JA    Current Outpatient Prescriptions  Medication Sig Dispense Refill  . acyclovir (ZOVIRAX) 400 MG tablet TAKE 1 TABLET BY MOUTH THREE TIMES A DAY AS NEEDED  30 tablet  5  . aspirin 81 MG tablet Take 160 mg by mouth daily.      Marland Kitchen atorvastatin (LIPITOR) 40 MG tablet TAKE 1 TABLET EVERY DAY  30 tablet  5  . diltiazem (CARDIZEM CD) 120 MG 24 hr capsule Take 1 capsule (120 mg total) by mouth daily.  30 capsule  3  . multivitamin (THERAGRAN) per tablet Take 1 tablet by mouth daily.          No Known Allergies  History   Social History  . Marital Status: Single    Spouse Name: N/A    Number of Children: N/A  . Years of Education: N/A   Occupational History  . Not on file.   Social History Main Topics  . Smoking status: Never Smoker   . Smokeless tobacco: Not on file   Comment: denies   . Alcohol Use: 0.6 oz/week    1 Cans of beer per week  . Drug Use: No  . Sexually Active: Yes   Other Topics Concern  . Not on file   Social History Narrative  . No narrative on file    Family History  Problem Relation Age of Onset  . Hypertension Father   . Hypertension Brother   . Hyperlipidemia Brother   . Stroke Mother    Physical Exam: Filed Vitals:   08/12/12 1016  BP: 119/72  Pulse: 72  Height: 5\' 9"  (1.753 m)  Weight: 186 lb (84.369 kg)    GEN- The  patient is well appearing, alert and oriented x 3 today.   Head- normocephalic, atraumatic Eyes-  Sclera clear, conjunctiva pink Ears- hearing intact Oropharynx- clear Neck- supple, no JVP Lymph- no cervical lymphadenopathy Lungs- Clear to ausculation bilaterally, normal work of breathing Heart- Regular rate and rhythm, no murmurs, rubs or gallops, PMI not laterally displaced GI- soft, NT, ND, + BS Extremities- no clubbing, cyanosis, or edema  ekg today reveals sinus rhythm 68bpm   Assessment and Plan:

## 2012-08-12 NOTE — Patient Instructions (Addendum)
Your physician recommends that you schedule a follow-up appointment in: first week of Jan  Restart Pradaxa 150mg  twice daily on 09/28/12  Your physician has recommended that you have an ablation. Catheter ablation is a medical procedure used to treat some cardiac arrhythmias (irregular heartbeats). During catheter ablation, a long, thin, flexible tube is put into a blood vessel in your groin (upper thigh), or neck. This tube is called an ablation catheter. It is then guided to your heart through the blood vessel. Radio frequency waves destroy small areas of heart tissue where abnormal heartbeats may cause an arrhythmia to start. Please see the instruction sheet given to you today. Will schedule for 10/29/12

## 2012-08-12 NOTE — Assessment & Plan Note (Signed)
He has begun having recurrent paroxysms of afib. Therapeutic strategies for afib including medicine and ablation were discussed in detail with the patient today. Risk, benefits, and alternatives to EP study and radiofrequency ablation for afib were also discussed in detail today.  He would like to proceed with ablation.  We will schedule ablation for earlier January.  He will require initiation of anticoagulation 3 weeks prior to the procedure.

## 2012-09-13 ENCOUNTER — Other Ambulatory Visit: Payer: Self-pay | Admitting: Family Medicine

## 2012-09-24 ENCOUNTER — Telehealth: Payer: Self-pay | Admitting: Internal Medicine

## 2012-09-24 MED ORDER — DABIGATRAN ETEXILATE MESYLATE 150 MG PO CAPS: 150.0000 mg | ORAL_CAPSULE | Freq: Two times a day (BID) | ORAL | Status: DC

## 2012-09-24 NOTE — Telephone Encounter (Signed)
plz return call to pt [phone] regarding ablation, and pradaxa RX.

## 2012-09-24 NOTE — Telephone Encounter (Signed)
Called and spoke with patient wil call in his Pradaxa

## 2012-10-02 ENCOUNTER — Encounter: Payer: Self-pay | Admitting: *Deleted

## 2012-10-02 ENCOUNTER — Other Ambulatory Visit: Payer: Self-pay | Admitting: *Deleted

## 2012-10-02 DIAGNOSIS — I4891 Unspecified atrial fibrillation: Secondary | ICD-10-CM

## 2012-10-15 ENCOUNTER — Other Ambulatory Visit: Payer: Self-pay | Admitting: *Deleted

## 2012-10-15 MED ORDER — DILTIAZEM HCL ER COATED BEADS 120 MG PO CP24: 120.0000 mg | ORAL_CAPSULE | Freq: Every day | ORAL | Status: DC

## 2012-10-19 ENCOUNTER — Encounter (HOSPITAL_COMMUNITY): Payer: Self-pay | Admitting: Pharmacy Technician

## 2012-10-19 ENCOUNTER — Ambulatory Visit (INDEPENDENT_AMBULATORY_CARE_PROVIDER_SITE_OTHER): Payer: BC Managed Care – PPO | Admitting: Internal Medicine

## 2012-10-19 ENCOUNTER — Encounter: Payer: Self-pay | Admitting: Internal Medicine

## 2012-10-19 VITALS — BP 108/70 | HR 82 | Ht 69.0 in | Wt 185.0 lb

## 2012-10-19 DIAGNOSIS — E785 Hyperlipidemia, unspecified: Secondary | ICD-10-CM

## 2012-10-19 DIAGNOSIS — I4891 Unspecified atrial fibrillation: Secondary | ICD-10-CM

## 2012-10-19 DIAGNOSIS — I10 Essential (primary) hypertension: Secondary | ICD-10-CM

## 2012-10-19 LAB — BASIC METABOLIC PANEL
BUN: 24 mg/dL — ABNORMAL HIGH (ref 6–23)
Chloride: 104 mEq/L (ref 96–112)
Potassium: 4.2 mEq/L (ref 3.5–5.1)
Sodium: 140 mEq/L (ref 135–145)

## 2012-10-19 LAB — CBC WITH DIFFERENTIAL/PLATELET
Basophils Relative: 0.3 % (ref 0.0–3.0)
Eosinophils Relative: 1.5 % (ref 0.0–5.0)
HCT: 49.4 % (ref 39.0–52.0)
Hemoglobin: 16.7 g/dL (ref 13.0–17.0)
Lymphs Abs: 2.4 10*3/uL (ref 0.7–4.0)
MCV: 87.9 fl (ref 78.0–100.0)
Monocytes Absolute: 0.8 10*3/uL (ref 0.1–1.0)
Monocytes Relative: 11.9 % (ref 3.0–12.0)
Neutro Abs: 3.2 10*3/uL (ref 1.4–7.7)
Platelets: 211 10*3/uL (ref 150.0–400.0)
WBC: 6.4 10*3/uL (ref 4.5–10.5)

## 2012-10-19 NOTE — Progress Notes (Signed)
PCP: Carollee Herter, MD  The patient presents today for routine electrophysiology followup.  He continues to have intermittent afib, 1-2 times per week.  He reports fatigue with diltiazem.   He is compliant with pradaxa without difficulty.  Today, he denies symptoms of chest pain, shortness of breath, orthopnea, PND, lower extremity edema, dizziness, presyncope, syncope, or neurologic sequela.    Past Medical History  Diagnosis Date  . Atrial fibrillation     paroxysmal s/p PVI by JA  . Diverticulosis   . Hypercholesterolemia    Past Surgical History  Procedure Date  . Colonoscopy 2004  . Atrail fibrillation ablation 7/10    PVI by JA    Current Outpatient Prescriptions  Medication Sig Dispense Refill  . acyclovir (ZOVIRAX) 400 MG tablet TAKE 1 TABLET BY MOUTH THREE TIMES A DAY AS NEEDED  30 tablet  5  . aspirin 81 MG tablet Take 160 mg by mouth daily.      Marland Kitchen atorvastatin (LIPITOR) 40 MG tablet TAKE 1 TABLET EVERY DAY  30 tablet  5  . dabigatran (PRADAXA) 150 MG CAPS Take 1 capsule (150 mg total) by mouth every 12 (twelve) hours.  60 capsule  2  . diltiazem (CARDIZEM CD) 120 MG 24 hr capsule Take 1 capsule (120 mg total) by mouth daily.  30 capsule  3  . multivitamin (THERAGRAN) per tablet Take 1 tablet by mouth daily.          No Known Allergies  History   Social History  . Marital Status: Single    Spouse Name: N/A    Number of Children: N/A  . Years of Education: N/A   Occupational History  . Not on file.   Social History Main Topics  . Smoking status: Never Smoker   . Smokeless tobacco: Not on file     Comment: denies   . Alcohol Use: 0.6 oz/week    1 Cans of beer per week  . Drug Use: No  . Sexually Active: Yes   Other Topics Concern  . Not on file   Social History Narrative  . No narrative on file    Family History  Problem Relation Age of Onset  . Hypertension Father   . Hypertension Brother   . Hyperlipidemia Brother   . Stroke Mother     Physical Exam: Filed Vitals:   10/19/12 0926  BP: 108/70  Pulse: 82  Height: 5\' 9"  (1.753 m)  Weight: 185 lb (83.915 kg)    GEN- The patient is well appearing, alert and oriented x 3 today.   Head- normocephalic, atraumatic Eyes-  Sclera clear, conjunctiva pink Ears- hearing intact Oropharynx- clear Neck- supple, no JVP Lymph- no cervical lymphadenopathy Lungs- Clear to ausculation bilaterally, normal work of breathing Heart- Regular rate and rhythm, no murmurs, rubs or gallops, PMI not laterally displaced GI- soft, NT, ND, + BS Extremities- no clubbing, cyanosis, or edema  ekg today reveals sinus rhythm 82 bpm   Assessment and Plan:

## 2012-10-19 NOTE — Assessment & Plan Note (Signed)
Stable No change required today  

## 2012-10-19 NOTE — Patient Instructions (Signed)

## 2012-10-19 NOTE — Assessment & Plan Note (Addendum)
Maurice [name] continues to have recurrent paroxysms of afib.  He did very well with his first ablation and would like to proceed with repeat ablation at this time.  Therapeutic strategies for afib including medicine and ablation were discussed in detail with the patient today. Risk, benefits, and alternatives to EP study and radiofrequency ablation for afib were also discussed in detail today. These risks include but are not limited to stroke, bleeding, vascular damage, tamponade, perforation, damage to the esophagus, lungs, and other structures, pulmonary vein stenosis, worsening renal function, and death. The patient understands these risk and wishes to proceed.  We will therefore proceed with catheter ablation at the next available time.  He will also be offered enrollment in [facility] AF registry.

## 2012-10-29 ENCOUNTER — Encounter (HOSPITAL_COMMUNITY)
Admission: RE | Disposition: A | Discharge status: Home / Self Care | Payer: Self-pay | Source: Ambulatory Visit | Attending: Internal Medicine

## 2012-10-29 ENCOUNTER — Ambulatory Visit (HOSPITAL_COMMUNITY)
Admission: RE | Admit: 2012-10-29 | Discharge: 2012-10-30 | Disposition: A | Discharge status: Home / Self Care | Payer: BC Managed Care – PPO | Source: Ambulatory Visit | Attending: Internal Medicine | Admitting: Internal Medicine

## 2012-10-29 ENCOUNTER — Encounter (HOSPITAL_COMMUNITY): Payer: Self-pay | Admitting: Anesthesiology

## 2012-10-29 ENCOUNTER — Encounter (HOSPITAL_COMMUNITY): Payer: Self-pay

## 2012-10-29 ENCOUNTER — Ambulatory Visit (HOSPITAL_COMMUNITY): Payer: BC Managed Care – PPO | Admitting: Anesthesiology

## 2012-10-29 ENCOUNTER — Encounter (HOSPITAL_COMMUNITY): Payer: Self-pay | Admitting: *Deleted

## 2012-10-29 DIAGNOSIS — K5730 Diverticulosis of large intestine without perforation or abscess without bleeding: Secondary | ICD-10-CM | POA: Insufficient documentation

## 2012-10-29 DIAGNOSIS — E7800 Pure hypercholesterolemia, unspecified: Secondary | ICD-10-CM | POA: Insufficient documentation

## 2012-10-29 DIAGNOSIS — Z79899 Other long term (current) drug therapy: Secondary | ICD-10-CM | POA: Insufficient documentation

## 2012-10-29 DIAGNOSIS — I4891 Unspecified atrial fibrillation: Secondary | ICD-10-CM | POA: Insufficient documentation

## 2012-10-29 DIAGNOSIS — Z7982 Long term (current) use of aspirin: Secondary | ICD-10-CM | POA: Insufficient documentation

## 2012-10-29 HISTORY — PX: TEE WITHOUT CARDIOVERSION: SHX5443

## 2012-10-29 HISTORY — PX: ATRIAL FIBRILLATION ABLATION: SHX5456

## 2012-10-29 LAB — POCT ACTIVATED CLOTTING TIME
Activated Clotting Time: 328 seconds
Activated Clotting Time: 372 seconds

## 2012-10-29 SURGERY — ATRIAL FIBRILLATION ABLATION
Anesthesia: Monitor Anesthesia Care

## 2012-10-29 SURGERY — ECHOCARDIOGRAM, TRANSESOPHAGEAL
Anesthesia: Moderate Sedation

## 2012-10-29 MED ORDER — BUPIVACAINE HCL (PF) 0.25 % IJ SOLN
INTRAMUSCULAR | Status: AC
  Filled 2012-10-29: qty 60

## 2012-10-29 MED ORDER — HYDROXYUREA 500 MG PO CAPS
ORAL_CAPSULE | ORAL | Status: AC
  Filled 2012-10-29: qty 1

## 2012-10-29 MED ORDER — PROMETHAZINE HCL 25 MG/ML IJ SOLN: 6.2500 mg | INTRAMUSCULAR | Status: DC | PRN

## 2012-10-29 MED ORDER — DILTIAZEM HCL ER COATED BEADS 120 MG PO CP24
120.0000 mg | ORAL_CAPSULE | Freq: Every day | ORAL | Status: DC
  Administered 2012-10-29 – 2012-10-30 (×2): 120 mg via ORAL
  Filled 2012-10-29 (×4): qty 1

## 2012-10-29 MED ORDER — FENTANYL CITRATE 0.05 MG/ML IJ SOLN
INTRAMUSCULAR | Status: DC | PRN
  Administered 2012-10-29: 25 ug via INTRAVENOUS
  Administered 2012-10-29 (×2): 12.5 ug via INTRAVENOUS

## 2012-10-29 MED ORDER — HYDROMORPHONE HCL PF 2 MG/ML IJ SOLN: 0.2500 mg | INTRAMUSCULAR | Status: DC | PRN

## 2012-10-29 MED ORDER — FENTANYL CITRATE 0.05 MG/ML IJ SOLN
INTRAMUSCULAR | Status: AC
  Filled 2012-10-29: qty 2

## 2012-10-29 MED ORDER — PROTAMINE SULFATE 10 MG/ML IV SOLN
INTRAVENOUS | Status: DC | PRN
  Administered 2012-10-29 (×3): 10 mg via INTRAVENOUS

## 2012-10-29 MED ORDER — SODIUM CHLORIDE 0.9 % IJ SOLN: 3.0000 mL | INTRAMUSCULAR | Status: DC | PRN

## 2012-10-29 MED ORDER — PROPOFOL 10 MG/ML IV BOLUS
INTRAVENOUS | Status: DC | PRN
  Administered 2012-10-29: 10 mg via INTRAVENOUS
  Administered 2012-10-29: 30 mg via INTRAVENOUS
  Administered 2012-10-29: 75 mg via INTRAVENOUS

## 2012-10-29 MED ORDER — SODIUM CHLORIDE 0.9 % IV SOLN: 250.0000 mL | INTRAVENOUS | Status: DC | PRN

## 2012-10-29 MED ORDER — HYDROCODONE-ACETAMINOPHEN 5-325 MG PO TABS: 1.0000 | ORAL_TABLET | ORAL | Status: DC | PRN

## 2012-10-29 MED ORDER — FENTANYL CITRATE 0.05 MG/ML IJ SOLN
INTRAMUSCULAR | Status: DC | PRN
  Administered 2012-10-29: 25 ug via INTRAVENOUS
  Administered 2012-10-29: 50 ug via INTRAVENOUS
  Administered 2012-10-29: 25 ug via INTRAVENOUS

## 2012-10-29 MED ORDER — HEPARIN SODIUM (PORCINE) 1000 UNIT/ML IJ SOLN
INTRAMUSCULAR | Status: AC
  Filled 2012-10-29: qty 1

## 2012-10-29 MED ORDER — ATORVASTATIN CALCIUM 40 MG PO TABS
40.0000 mg | ORAL_TABLET | Freq: Every day | ORAL | Status: DC
  Administered 2012-10-29: 40 mg via ORAL
  Filled 2012-10-29 (×2): qty 1

## 2012-10-29 MED ORDER — LIDOCAINE VISCOUS 2 % MT SOLN
OROMUCOSAL | Status: AC
  Filled 2012-10-29: qty 15

## 2012-10-29 MED ORDER — ADULT MULTIVITAMIN W/MINERALS CH
1.0000 | ORAL_TABLET | Freq: Every day | ORAL | Status: DC
  Administered 2012-10-30: 1 via ORAL
  Filled 2012-10-29: qty 1

## 2012-10-29 MED ORDER — ACYCLOVIR 400 MG PO TABS
400.0000 mg | ORAL_TABLET | Freq: Every morning | ORAL | Status: DC
  Administered 2012-10-30: 400 mg via ORAL
  Filled 2012-10-29: qty 1

## 2012-10-29 MED ORDER — LIDOCAINE VISCOUS 2 % MT SOLN
OROMUCOSAL | Status: DC | PRN
  Administered 2012-10-29: 20 mL via OROMUCOSAL

## 2012-10-29 MED ORDER — MIDAZOLAM HCL 5 MG/5ML IJ SOLN
INTRAMUSCULAR | Status: DC | PRN
  Administered 2012-10-29: 2 mg via INTRAVENOUS

## 2012-10-29 MED ORDER — OXYCODONE HCL 5 MG PO TABS: 5.0000 mg | ORAL_TABLET | Freq: Once | ORAL | Status: DC | PRN

## 2012-10-29 MED ORDER — LACTATED RINGERS IV SOLN: INTRAVENOUS | Status: DC | PRN

## 2012-10-29 MED ORDER — SODIUM CHLORIDE 0.9 % IV SOLN
INTRAVENOUS | Status: DC | PRN
  Administered 2012-10-29: 12:00:00 via INTRAVENOUS

## 2012-10-29 MED ORDER — METOPROLOL TARTRATE 1 MG/ML IV SOLN
INTRAVENOUS | Status: DC | PRN
  Administered 2012-10-29: 5 mg via INTRAVENOUS

## 2012-10-29 MED ORDER — SODIUM CHLORIDE 0.9 % IV SOLN
INTRAVENOUS | Status: DC
  Administered 2012-10-29: 500 mL via INTRAVENOUS

## 2012-10-29 MED ORDER — MIDAZOLAM HCL 10 MG/2ML IJ SOLN
INTRAMUSCULAR | Status: DC | PRN
  Administered 2012-10-29 (×2): 1 mg via INTRAVENOUS
  Administered 2012-10-29: 2 mg via INTRAVENOUS

## 2012-10-29 MED ORDER — ONDANSETRON HCL 4 MG/2ML IJ SOLN: 4.0000 mg | Freq: Four times a day (QID) | INTRAMUSCULAR | Status: DC | PRN

## 2012-10-29 MED ORDER — SODIUM CHLORIDE 0.9 % IJ SOLN
3.0000 mL | Freq: Two times a day (BID) | INTRAMUSCULAR | Status: DC
  Administered 2012-10-29 – 2012-10-30 (×2): 3 mL via INTRAVENOUS

## 2012-10-29 MED ORDER — MIDAZOLAM HCL 5 MG/ML IJ SOLN
INTRAMUSCULAR | Status: AC
  Filled 2012-10-29: qty 2

## 2012-10-29 MED ORDER — OXYCODONE HCL 5 MG/5ML PO SOLN: 5.0000 mg | Freq: Once | ORAL | Status: DC | PRN

## 2012-10-29 MED ORDER — PROPOFOL INFUSION 10 MG/ML OPTIME
INTRAVENOUS | Status: DC | PRN
  Administered 2012-10-29: 25 ug/kg/min via INTRAVENOUS

## 2012-10-29 MED ORDER — ACETAMINOPHEN 325 MG PO TABS: 650.0000 mg | ORAL_TABLET | ORAL | Status: DC | PRN

## 2012-10-29 MED ORDER — DABIGATRAN ETEXILATE MESYLATE 150 MG PO CAPS
150.0000 mg | ORAL_CAPSULE | Freq: Two times a day (BID) | ORAL | Status: DC
  Administered 2012-10-29 – 2012-10-30 (×2): 150 mg via ORAL
  Filled 2012-10-29 (×5): qty 1

## 2012-10-29 MED ORDER — MULTIVITAMINS PO TABS: 1.0000 | ORAL_TABLET | Freq: Every day | ORAL | Status: DC

## 2012-10-29 MED ORDER — LIDOCAINE HCL (CARDIAC) 20 MG/ML IV SOLN
INTRAVENOUS | Status: DC | PRN
  Administered 2012-10-29: 40 mg via INTRAVENOUS

## 2012-10-29 MED ORDER — HEPARIN SODIUM (PORCINE) 1000 UNIT/ML IJ SOLN
INTRAMUSCULAR | Status: DC | PRN
  Administered 2012-10-29: 3 mL via INTRAVENOUS

## 2012-10-29 NOTE — Interval H&P Note (Signed)
History and Physical Interval Note:  10/29/2012 10:18 AM  Maurice [name]  has presented today for surgery, with the diagnosis of afib  The various methods of treatment have been discussed with the patient and family. After consideration of risks, benefits and other options for treatment, the patient has consented to  Procedure(s) (LRB) with comments: ATRIAL FIBRILLATION ABLATION (N/A) as a surgical intervention .  The patient's history has been reviewed, patient examined, no change in status, stable for surgery.  I have reviewed the patient's chart and labs.  Questions were answered to the patient's satisfaction.     Hillis Range

## 2012-10-29 NOTE — Op Note (Signed)
SURGEON:  Hillis Range, MD  PREPROCEDURE DIAGNOSES: 1. Paroxysmal atrial fibrillation.  POSTPROCEDURE DIAGNOSES: 1. Paroxysmal  atrial fibrillation.  PROCEDURES: 1. Comprehensive electrophysiologic study. 2. Coronary sinus pacing and recording. 3. Three-dimensional mapping of atrial fibrillation with additional mapping and ablation of complex fractionated electrograms 4. Ablation of atrial fibrillation with additional mapping and ablation of complex fractionated electrograms 5. Intracardiac echocardiography. 6. Transseptal puncture of an intact septum. 7. Rotational Angiography with processing at an independent workstation 8. Arrhythmia induction with pacing with isuprel infusion 9. Cardioversion  INTRODUCTION:  Maurice [name] is a 62 y.o. male with a history of paroxysmal atrial fibrillation who now presents for EP study and radiofrequency ablation.  The patient reports initially being diagnosed with atrial fibrillation after presenting with symptomatic palpitations and fatgiue. The patient reports increasing frequency and duration of atrial fibrillation since that time.  The patient has failed medical therapy and underwent afib ablation by me in 2010.  Unfortunately, he has recently developed recurrent symptoms of afib.  The patient therefore presents today for repeat catheter ablation of atrial fibrillation.  DESCRIPTION OF PROCEDURE:  Informed written consent was obtained, and the patient was brought to the electrophysiology lab in a fasting state.  The patient was adequately sedated with intravenous medications as outlined in the anesthesia report.  The patient's left and right groins were prepped and draped in the usual sterile fashion by the EP lab staff.  Using a percutaneous Seldinger technique, two 7-French and one 11-French hemostasis sheath was placed into the right common femoral vein.   3 Dimensional Rotational Angiography: A 5 french pigtail catheter was introduced through  the right common femoral vein and advanced into the inferior venocava.  3 demential rotational angiography was then performed by power injection of 100cc of nonionic contrast.  Reprocessing at an independent work station was then performed.   This demonstrated a moderate sized left atrium with 4 separate pulmonary veins which were also moderate in size.  There were no anomalous veins or significant abnormalities.  A 3 dimensional rendering of the left atrium was then merged using [employer] onto the Well[employer] system and registered with intracardiac echo (see below).  The pigtail catheter was then removed.  Catheter Placement:  A 7-French Biosense Webster Decapolar coronary sinus catheter was introduced through the right common femoral vein and advanced into the coronary sinus for recording and pacing from this location.  A 6-French quadripolar Josephson catheter was introduced through the right common femoral vein and advanced into the right ventricle for recording and pacing.  This catheter was then pulled back to the His bundle location.    Initial Measurements: The patient presented to the electrophysiology lab in sinus rhythm.  His PR interval measured with a QRS duringation of 82 msec and a QT interval of .  The AH interval measured and the HV interval measured 58 msec.     Intracardiac Echocardiography: A 10-French Biosense Webster AcuNav intracardiac echocardiography catheter was introduced through the left common femoral vein and advanced into the right atrium. Intracardiac echocardiography was performed of the left atrium, and a three-dimensional anatomical rendering of the left atrium was performed using CARTO sound technology.  The patient was noted to have a moderate sized left atrium.  The interatrial septum was prominent but not aneurysmal. All 4 pulmonary veins were visualized and noted to have separate ostia.  The pulmonary veins were moderate in size,  though the right superior pulmonary vein was rather large.  The left atrial appendage was visualized and did not reveal thrombus.   There was no evidence of pulmonary vein stenosis.   Transseptal Puncture: The middle right common femoral vein sheath was exchanged for an 8.5 [place] SL2 transseptal sheath and transseptal access was achieved in a standard fashion using a Brockenbrough needle under biplane fluoroscopy with intracardiac echocardiography confirmation of the transseptal puncture.  Once transseptal access had been achieved, heparin was administered intravenously and intra- arterially in order to maintain an ACT of greater than 350 seconds throughout the procedure.    3D Mapping and Ablation: The His bundle catheter was removed and in its place a 3.5 mm Biosense Webster EZ [employer] ablation catheter was advanced into the right atrium.  The transseptal sheath was pulled back into the IVC over a guidewire.  The ablation catheter was advanced across the transseptal hole using the wire as a guide.  The transseptal sheath was then re-advanced over the guidewire into the left atrium.  A duodecapolar Biosense Webster circular mapping catheter was introduced through the transseptal sheath and positioned over the mouth of all 4 pulmonary veins.  Three-dimensional electroanatomical mapping was performed using CARTO technology.  This demonstrated electrical activity within the left superior and right inferior pulmonary veins.  The left inferior and right superior pulmonary veins remained isolated from the prior procedure. The patient underwent successful sequential electrical isolation and anatomical encircling of the left superior and right inferior veins using radiofrequency current with a circular mapping catheter as a guide.   Following ablation, Isuprel was infused up to 20 mcg/min with no inducible atrial tachycardia, atrial flutter, or sustained PACs.  Atrial fibrillation was induced with  catheter manipulation within the left atrium.   Complex fractionated electrograms were mapped and ablated along the lateral wall of the left atrium and above the CS.  There was a paucity of CAFEs along the posterior wall, roof of the LA and also within the right atrium.   Cardioversion: The patient was then cardioverted to sinus rhythm with a single synchronized 360-J biphasic shock with cardioversion electrodes in the anterior-posterior thoracic configuration. He maintained sinus rhythm initially but then had recurence of afib with catheter manipulation within the left atrial, requiring 3 repeat cardioversions with 360J biphasic.  He remained in sinus rhythm thereafter.  Measurements Following Ablation: In sinus rhythm with RR interval was 975, with PR , QRS 82 msec, and Qtc 384 msec.  Ventricular pacing was performed, which revealed midline decremental VA conduction with a VA Wenckebach cycle length of 470 msec.  Rapid atrial pacing was performed, which revealed an AV Wenckebach cycle length of 390 msec.  Electroisolation was then again confirmed in all four pulmonary veins.  The procedure was therefore considered completed.  All catheters were removed, and the sheaths were aspirated and flushed.  The patient was transferred to the recovery area for sheath removal per protocol.  A limited bedside transthoracic echocardiogram revealed no pericardial effusion.  There were no early apparent complications.  CONCLUSIONS: 1. Sinus rhythm upon presentation.   2. Rotational Angiography reveals a moderate sized left atrium with four separate pulmonary veins without evidence of pulmonary vein stenosis. 3. Successful sequential electrical isolation and anatomical encircling of the left superior and right inferior veins using radiofrequency current.  The left inferior and right superior pulmonary veins remained isolated from the prior procedure.  CAFEs also ablated along the lateral wall of the LA and  above the CS 4. Successful cardioversion to sinus rhythm 5. No  early apparent complications.   Kailan Carmen,MD 2:25 PM 10/29/2012

## 2012-10-29 NOTE — H&P (View-Only) (Signed)
PCP: Maurice [name],Maurice CHARLES, MD  The patient presents today for routine electrophysiology followup.  He continues to have intermittent afib, 1-2 times per week.  He reports fatigue with diltiazem.   He is compliant with pradaxa without difficulty.  Today, he denies symptoms of chest pain, shortness of breath, orthopnea, PND, lower extremity edema, dizziness, presyncope, syncope, or neurologic sequela.    Past Medical History  Diagnosis Date  . Atrial fibrillation     paroxysmal s/p PVI by JA  . Diverticulosis   . Hypercholesterolemia    Past Surgical History  Procedure Date  . Colonoscopy 2004  . Atrail fibrillation ablation 7/10    PVI by JA    Current Outpatient Prescriptions  Medication Sig Dispense Refill  . acyclovir (ZOVIRAX) 400 MG tablet TAKE 1 TABLET BY MOUTH THREE TIMES A DAY AS NEEDED  30 tablet  5  . aspirin 81 MG tablet Take 160 mg by mouth daily.      . atorvastatin (LIPITOR) 40 MG tablet TAKE 1 TABLET EVERY DAY  30 tablet  5  . dabigatran (PRADAXA) 150 MG CAPS Take 1 capsule (150 mg total) by mouth every 12 (twelve) hours.  60 capsule  2  . diltiazem (CARDIZEM CD) 120 MG 24 hr capsule Take 1 capsule (120 mg total) by mouth daily.  30 capsule  3  . multivitamin (THERAGRAN) per tablet Take 1 tablet by mouth daily.          No Known Allergies  History   Social History  . Marital Status: Single    Spouse Name: N/A    Number of Children: N/A  . Years of Education: N/A   Occupational History  . Not on file.   Social History Main Topics  . Smoking status: Never Smoker   . Smokeless tobacco: Not on file     Comment: denies   . Alcohol Use: 0.6 oz/week    1 Cans of beer per week  . Drug Use: No  . Sexually Active: Yes   Other Topics Concern  . Not on file   Social History Narrative  . No narrative on file    Family History  Problem Relation Age of Onset  . Hypertension Father   . Hypertension Brother   . Hyperlipidemia Brother   . Stroke Mother     Physical Exam: Filed Vitals:   10/19/12 0926  BP: 108/70  Pulse: 82  Height: 5' 9" (1.753 m)  Weight: 185 lb (83.915 kg)    GEN- The patient is well appearing, alert and oriented x 3 today.   Head- normocephalic, atraumatic Eyes-  Sclera clear, conjunctiva pink Ears- hearing intact Oropharynx- clear Neck- supple, no JVP Lymph- no cervical lymphadenopathy Lungs- Clear to ausculation bilaterally, normal work of breathing Heart- Regular rate and rhythm, no murmurs, rubs or gallops, PMI not laterally displaced GI- soft, NT, ND, + BS Extremities- no clubbing, cyanosis, or edema  ekg today reveals sinus rhythm 82 bpm   Assessment and Plan:  

## 2012-10-29 NOTE — Op Note (Signed)
No thrombus in LA or LAA No signif valve abnormalities Normal LV function Full report to follow in CV section.

## 2012-10-29 NOTE — Anesthesia Preprocedure Evaluation (Addendum)
Anesthesia Evaluation  Patient identified by MRN, date of birth, ID band Patient awake    Reviewed: Allergy & Precautions, H&P , NPO status , Patient's Chart, lab work & pertinent test results  History of Anesthesia Complications Negative for: history of anesthetic complications  Airway Mallampati: I      Dental  (+) Teeth Intact and Dental Advisory Given   Pulmonary neg pulmonary ROS,  breath sounds clear to auscultation        Cardiovascular hypertension, Pt. on medications + dysrhythmias Atrial Fibrillation Rhythm:Irregular Rate:Normal     Neuro/Psych negative neurological ROS     GI/Hepatic negative GI ROS, Neg liver ROS,   Endo/Other  negative endocrine ROS  Renal/GU negative Renal ROS     Musculoskeletal   Abdominal   Peds  Hematology negative hematology ROS (+)   Anesthesia Other Findings   Reproductive/Obstetrics                         Anesthesia Physical Anesthesia Plan  ASA: II  Anesthesia Plan: MAC and General   Post-op Pain Management:    Induction: Intravenous  Airway Management Planned: LMA and Mask  Additional Equipment:   Intra-op Plan:   Post-operative Plan: Extubation in OR  Informed Consent: I have reviewed the patients History and Physical, chart, labs and discussed the procedure including the risks, benefits and alternatives for the proposed anesthesia with the patient or authorized representative who has indicated his/her understanding and acceptance.   Dental advisory given  Plan Discussed with: CRNA and Anesthesiologist  Anesthesia Plan Comments:         Anesthesia Quick Evaluation

## 2012-10-29 NOTE — Preoperative (Signed)
Beta Blockers   Reason not to administer Beta Blockers:Not Applicable 

## 2012-10-29 NOTE — Transfer of Care (Signed)
Immediate Anesthesia Transfer of Care Note  Patient: Maurice [name]  Procedure(s) Performed: Procedure(s) (LRB) with comments: ATRIAL FIBRILLATION ABLATION (N/A)  Patient Location: Cath Lab  Anesthesia Type:MAC  Level of Consciousness: awake, alert  and oriented  Airway & Oxygen Therapy: Patient connected to face mask  Post-op Assessment: Report given to PACU RN  Post vital signs: stable  Complications: No apparent anesthesia complications

## 2012-10-29 NOTE — Anesthesia Procedure Notes (Signed)
Procedure Name: MAC Date/Time: 10/29/2012 12:00 PM Performed by: Quentin Ore Pre-anesthesia Checklist: Patient identified, Emergency Drugs available, Suction available, Patient being monitored and Timeout performed Patient Re-evaluated:Patient Re-evaluated prior to inductionOxygen Delivery Method: Simple face mask Preoxygenation: Pre-oxygenation with 100% oxygen Intubation Type: IV induction

## 2012-10-29 NOTE — Interval H&P Note (Signed)
History and Physical Interval Note:  10/29/2012 9:48 AM  Maurice [name]  has presented today for surgery, with the diagnosis of a-fib  The various methods of treatment have been discussed with the patient and family. After consideration of risks, benefits and other options for treatment, the patient has consented to  Procedure(s) (LRB) with comments: TRANSESOPHAGEAL ECHOCARDIOGRAM (TEE) (N/A) - pre ablation as a surgical intervention .  The patient's history has been reviewed, patient examined, no change in status, stable for surgery.  I have reviewed the patient's chart and labs.  Questions were answered to the patient's satisfaction.     Dietrich Pates

## 2012-10-29 NOTE — Anesthesia Postprocedure Evaluation (Signed)
  Anesthesia Post-op Note  Patient: Maurice [name]  Procedure(s) Performed: Procedure(s) (LRB) with comments: ATRIAL FIBRILLATION ABLATION (N/A)  Patient Location: PACU  Anesthesia Type:General  Level of Consciousness: awake  Airway and Oxygen Therapy: Patient Spontanous Breathing  Post-op Pain: mild  Post-op Assessment: Post-op Vital signs reviewed  Post-op Vital Signs: Reviewed  Complications: No apparent anesthesia complications

## 2012-10-29 NOTE — Progress Notes (Signed)
  Echocardiogram Echocardiogram Transesophageal has been performed.  Maurice [name] 10/29/2012, 10:13 AM

## 2012-10-30 ENCOUNTER — Encounter (HOSPITAL_COMMUNITY): Payer: Self-pay | Admitting: Internal Medicine

## 2012-10-30 DIAGNOSIS — I4891 Unspecified atrial fibrillation: Secondary | ICD-10-CM

## 2012-10-30 MED ORDER — OMEPRAZOLE 40 MG PO CPDR: 40.0000 mg | DELAYED_RELEASE_CAPSULE | Freq: Every day | ORAL | Status: DC

## 2012-10-30 NOTE — Progress Notes (Signed)
   SUBJECTIVE: The patient is doing well today.  At this time, he denies chest pain, shortness of breath, or any new concerns.     Marland Kitchen acyclovir  400 mg Oral q morning - 10a  . atorvastatin  40 mg Oral q1800  . dabigatran  150 mg Oral Q12H  . diltiazem  120 mg Oral Daily  . multivitamin with minerals  1 tablet Oral Daily  . sodium chloride  3 mL Intravenous Q12H      OBJECTIVE: Physical Exam: Filed Vitals:   10/29/12 1900 10/29/12 2000 10/29/12 2351 10/30/12 0310  BP: 118/77 120/73 122/71 108/66  Pulse: 68 68    Temp:  98.6 F (37 C) 98.7 F (37.1 C) 98.2 F (36.8 C)  TempSrc:  Oral Oral Oral  Resp: 20 17 20    Height:      Weight:      SpO2: 97% 98% 94% 92%    Intake/Output Summary (Last 24 hours) at 10/30/12 0616 Last data filed at 10/29/12 2200  Gross per 24 hour  Intake   1700 ml  Output    975 ml  Net    725 ml    Telemetry reveals sinus rhythm  GEN- The patient is well appearing, alert and oriented x 3 today.   Head- normocephalic, atraumatic Eyes-  Sclera clear, conjunctiva pink Ears- hearing intact Oropharynx- clear Neck- supple, no JVP Lymph- no cervical lymphadenopathy Lungs- Clear to ausculation bilaterally, normal work of breathing Heart- Regular rate and rhythm, no murmurs, rubs or gallops, PMI not laterally displaced GI- soft, NT, ND, + BS Extremities- no clubbing, cyanosis, or edema, no hematoma/ bruits Skin- no rash or lesion Psych- euthymic mood, full affect Neuro- strength and sensation are intact  ASSESSMENT AND PLAN:  Active Problems:  Atrial fibrillation   1. AF Doing well s/p ablation Routine wound care Continue pradaxa (uninterupted) until he sees me in 3 months Add prilosec x 6 weeks  Follow-up with me in 3 months  DC to home today   Hillis Range, MD 10/30/2012 6:16 AM

## 2012-10-30 NOTE — Progress Notes (Signed)
Discharge instructions given to pt; pt signed and acknowledged; PIV taken out; belongings at pt's bedside; ride called; emotional support given; pt educated & acknowledged when to call MD/911.

## 2012-10-30 NOTE — Discharge Summary (Signed)
ELECTROPHYSIOLOGY DISCHARGE SUMMARY    Patient ID: Maurice [name],  MRN: [id number], DOB/AGE: 62-Mar-1952 62 y.o.  Admit date: 10/29/2012 Discharge date: 10/30/2012  Primary Care Physician: Carollee Herter, MD Primary Cardiologist: Johney Frame, MD  Primary Discharge Diagnosis:  1. Symptomatic AFib s/p TEE-guided EP study +RF ablation  Secondary Discharge Diagnoses:  1. Dyslipidemia  Procedures This Admission:  1. TEE-guided EP study +RF ablation of AFib  - TEE findings: No thrombus in LA or LAA. No significant valve abnormalities. Normal LV function.  - Comprehensive electrophysiologic study.   - Coronary sinus pacing and recording.   - 3-dimensional mapping of atrial fibrillation with additional mapping & ablation of complex fractionated electrograms   - Ablation of atrial fibrillation with additional mapping and ablation of complex fractionated electrograms   - Intracardiac echocardiography.   - Transseptal puncture of an intact septum.   - Rotational Angiography with processing at an independent workstation   - Arrhythmia induction with pacing with isuprel infusion   - Cardioversion  History and Hospital Course:  Maurice [name] is a 62 year old gentleman with symptomatic, paroxysmal AFib who presented yesterday for TEE-guided EP study +RF ablation of AFib, as outlined above. Please see procedure note for full details. He tolerated this procedure well without any immediate complication. He remains hemodynamically stable and afebrile. His groin site is intact without significant bleeding or hematoma. Telemetry reveals normal sinus rhythm. He has been given discharge instructions including wound care and activity restrictions. He will remain on Pradaxa and diltiazem. He will start PPI daily x 6 weeks post procedure; otherwise there were no changes made to his medications. He will follow-up in clinic in 12 weeks. He has been seen, examined and deemed stable for discharge today by  Dr. Hillis Range.  Discharge Vitals: Blood pressure 127/69, pulse 67, temperature 98.6 F (37 C), temperature source Oral, resp. rate 18, height 5\' 9"  (1.753 m), weight 182 lb 8.7 oz (82.8 kg), SpO2 95.00%.   Labs: Lab Results  Component Value Date   WBC 6.4 10/19/2012   HGB 16.7 10/19/2012   HCT 49.4 10/19/2012   MCV 87.9 10/19/2012   PLT 211.0 10/19/2012   No results found for this basename: NA,K,CL,CO2,BUN,CREATININE,CALCIUM,LABALBU,PROT,BILITOT,ALKPHOS,ALT,AST,GLUCOSE in the last 168 hours No results found for this basename: CKTOTAL, CKMB, CKMBINDEX, TROPONINI    Disposition:  The patient is being discharged in stable condition.  Follow-up: Follow-up Information    Follow up with Hillis Range, MD. On 01/20/2013. (At 9:15 AM)    Contact information:   [address] [place] [address] [phone]       Discharge Medications:    Medication List     As of 10/30/2012  8:17 AM    TAKE these medications         acyclovir 400 MG tablet   Commonly known as: ZOVIRAX   TAKE 1 TABLET BY MOUTH THREE TIMES A DAY AS NEEDED      atorvastatin 40 MG tablet   Commonly known as: LIPITOR   TAKE 1 TABLET EVERY DAY      dabigatran 150 MG Caps   Commonly known as: PRADAXA   Take 1 capsule (150 mg total) by mouth every 12 (twelve) hours.      diltiazem 120 MG 24 hr capsule   Commonly known as: CARDIZEM CD   Take 1 capsule (120 mg total) by mouth daily.      multivitamin per tablet   Take 1 tablet by mouth daily.  omeprazole 40 MG capsule   Commonly known as: PRILOSEC   Take 1 capsule (40 mg total) by mouth daily. For 6 weeks then stop.      Duration of Discharge Encounter: Greater than 30 minutes including physician time.  Limmie Patricia, PA-C 10/30/2012, 8:17 AM    Hillis Range MD

## 2012-11-28 ENCOUNTER — Other Ambulatory Visit: Payer: Self-pay

## 2012-12-31 ENCOUNTER — Other Ambulatory Visit: Payer: Self-pay | Admitting: Cardiology

## 2012-12-31 MED ORDER — DABIGATRAN ETEXILATE MESYLATE 150 MG PO CAPS: 150.0000 mg | ORAL_CAPSULE | Freq: Two times a day (BID) | ORAL | Status: DC

## 2013-01-20 ENCOUNTER — Encounter: Payer: Self-pay | Admitting: Internal Medicine

## 2013-01-20 ENCOUNTER — Ambulatory Visit (INDEPENDENT_AMBULATORY_CARE_PROVIDER_SITE_OTHER): Payer: BC Managed Care – PPO | Admitting: Internal Medicine

## 2013-01-20 VITALS — BP 125/80 | HR 58 | Ht 69.0 in | Wt 182.8 lb

## 2013-01-20 DIAGNOSIS — I4891 Unspecified atrial fibrillation: Secondary | ICD-10-CM

## 2013-01-20 MED ORDER — ASPIRIN EC 81 MG PO TBEC: 81.0000 mg | DELAYED_RELEASE_TABLET | Freq: Every day | ORAL | Status: DC

## 2013-01-20 NOTE — Progress Notes (Signed)
PCP: Carollee Herter, MD  Maurice [name] is a 62 y.o. male who presents today for routine electrophysiology followup.  Since his recent afib ablation, the patient reports doing very well.  He has had some ERAF but has done quite well.  He denies procedure related complications.  Today, he denies symptoms of chest pain, shortness of breath,  lower extremity edema, dizziness, presyncope, or syncope.  The patient is otherwise without complaint today.   Past Medical History  Diagnosis Date  . Atrial fibrillation     paroxysmal s/p PVI by JA  . Diverticulosis   . Hypercholesterolemia    Past Surgical History  Procedure Laterality Date  . Colonoscopy  2004  . Atrail fibrillation ablation  7/10, 1//17/14    PVI by JA  . Tonsillectomy    . Tee without cardioversion  10/29/2012    Procedure: TRANSESOPHAGEAL ECHOCARDIOGRAM (TEE);  Surgeon: Pricilla Riffle, MD;  Location: [facility] ENDOSCOPY;  Service: Cardiovascular;  Laterality: N/A;  pre ablation    Current Outpatient Prescriptions  Medication Sig Dispense Refill  . acyclovir (ZOVIRAX) 400 MG tablet TAKE 1 TABLET BY MOUTH THREE TIMES A DAY AS NEEDED  30 tablet  5  . atorvastatin (LIPITOR) 40 MG tablet TAKE 1 TABLET EVERY DAY  30 tablet  5  . diltiazem (CARDIZEM CD) 120 MG 24 hr capsule Take 1 capsule (120 mg total) by mouth daily.  30 capsule  3  . multivitamin (THERAGRAN) per tablet Take 1 tablet by mouth daily.         No current facility-administered medications for this visit.    Physical Exam: Filed Vitals:   01/20/13 0928  BP: 125/80  Pulse: 58  Height: 5\' 9"  (1.753 m)  Weight: 182 lb 12.8 oz (82.918 kg)    GEN- The patient is well appearing, alert and oriented x 3 today.   Head- normocephalic, atraumatic Eyes-  Sclera clear, conjunctiva pink Ears- hearing intact Oropharynx- clear Lungs- Clear to ausculation bilaterally, normal work of breathing Heart- Regular rate and rhythm, no murmurs, rubs or gallops, PMI not laterally  displaced GI- soft, NT, ND, + BS Extremities- no clubbing, cyanosis, or edema  ekg today reveals sinus rhythm 59 bpm, with rate PACs  Assessment and Plan:  1. afib Doing well s/p ablation Increase diltiazem to 240mg  daily Stop pradaxa and return to ASA  Return in 3 months

## 2013-01-20 NOTE — Patient Instructions (Addendum)
Your physician recommends that you schedule a follow-up appointment in 3 months with Dr Allred    

## 2013-01-28 ENCOUNTER — Telehealth: Payer: Self-pay | Admitting: Internal Medicine

## 2013-01-28 MED ORDER — DILTIAZEM HCL ER COATED BEADS 240 MG PO CP24: 240.0000 mg | ORAL_CAPSULE | Freq: Every day | ORAL | Status: DC

## 2013-01-28 NOTE — Telephone Encounter (Signed)
Ov note reviewed, per Dr Jenel Lucks note his diltiazem was increased, MAR adjusted, order sent.

## 2013-01-28 NOTE — Telephone Encounter (Signed)
New PRoblem:    Patient called in because Dr. Johney Frame increased the patient's diltiazem (CARDIZEM CD) 120 MG 24 hr capsule to the 240mg  tablets at his last OV and would like that to be called into his pharmacy.  Please call back if you have any questions.

## 2013-04-15 ENCOUNTER — Encounter: Payer: Self-pay | Admitting: Internal Medicine

## 2013-04-15 ENCOUNTER — Ambulatory Visit (INDEPENDENT_AMBULATORY_CARE_PROVIDER_SITE_OTHER): Payer: BC Managed Care – PPO | Admitting: Internal Medicine

## 2013-04-15 VITALS — BP 118/80 | HR 64 | Ht 69.0 in | Wt 179.6 lb

## 2013-04-15 DIAGNOSIS — I4891 Unspecified atrial fibrillation: Secondary | ICD-10-CM

## 2013-04-15 NOTE — Patient Instructions (Addendum)
Your physician wants you to follow-up in: 6 months with Dr. Allred. You will receive a reminder letter in the mail two months in advance. If you don't receive a letter, please call our office to schedule the follow-up appointment.  

## 2013-04-15 NOTE — Progress Notes (Signed)
PCP: Carollee Herter, MD  Maurice [name] is a 63 y.o. male who presents today for routine electrophysiology followup. He has returned to regular activity.  He is pleased with the results of his procedure.  He has only rare palpitations but no afib.  Today, he denies symptoms of chest pain, shortness of breath,  lower extremity edema, dizziness, presyncope, or syncope.  The patient is otherwise without complaint today.   Past Medical History  Diagnosis Date  . Atrial fibrillation     paroxysmal s/p PVI by JA  . Diverticulosis   . Hypercholesterolemia    Past Surgical History  Procedure Laterality Date  . Colonoscopy  2004  . Atrail fibrillation ablation  7/10, 1//17/14    PVI by JA  . Tonsillectomy    . Tee without cardioversion  10/29/2012    Procedure: TRANSESOPHAGEAL ECHOCARDIOGRAM (TEE);  Surgeon: Pricilla Riffle, MD;  Location: [facility] ENDOSCOPY;  Service: Cardiovascular;  Laterality: N/A;  pre ablation    Current Outpatient Prescriptions  Medication Sig Dispense Refill  . acyclovir (ZOVIRAX) 400 MG tablet TAKE 1 TABLET BY MOUTH THREE TIMES A DAY AS NEEDED  30 tablet  5  . aspirin EC 81 MG tablet Take 1 tablet (81 mg total) by mouth daily.  90 tablet  3  . atorvastatin (LIPITOR) 40 MG tablet TAKE 1 TABLET EVERY DAY  30 tablet  5  . diltiazem (CARDIZEM CD) 240 MG 24 hr capsule Take 1 capsule (240 mg total) by mouth daily.  30 capsule  3  . multivitamin (THERAGRAN) per tablet Take 1 tablet by mouth daily.         No current facility-administered medications for this visit.    Physical Exam: Filed Vitals:   04/15/13 0841  BP: 118/80  Pulse: 64  Height: 5\' 9"  (1.753 m)  Weight: 179 lb 9.6 oz (81.466 kg)    GEN- The patient is well appearing, alert and oriented x 3 today.   Head- normocephalic, atraumatic Eyes-  Sclera clear, conjunctiva pink Ears- hearing intact Oropharynx- clear Lungs- Clear to ausculation bilaterally, normal work of breathing Heart- Regular rate and  rhythm, no murmurs, rubs or gallops, PMI not laterally displaced GI- soft, NT, ND, + BS Extremities- no clubbing, cyanosis, or edema  ekg today reveals sinus rhythm 64 bpm  Assessment and Plan:  1. afib Maintaining sinus rhythm off of AAD No changes today  Return in 6 months

## 2013-05-07 ENCOUNTER — Encounter: Payer: Self-pay | Admitting: Internal Medicine

## 2013-05-18 ENCOUNTER — Ambulatory Visit (INDEPENDENT_AMBULATORY_CARE_PROVIDER_SITE_OTHER): Payer: BC Managed Care – PPO | Admitting: Family Medicine

## 2013-05-18 ENCOUNTER — Encounter: Payer: Self-pay | Admitting: Family Medicine

## 2013-05-18 VITALS — BP 122/78 | HR 64 | Ht 69.5 in | Wt 182.0 lb

## 2013-05-18 DIAGNOSIS — I4891 Unspecified atrial fibrillation: Secondary | ICD-10-CM

## 2013-05-18 DIAGNOSIS — I83893 Varicose veins of bilateral lower extremities with other complications: Secondary | ICD-10-CM

## 2013-05-18 DIAGNOSIS — Z8042 Family history of malignant neoplasm of prostate: Secondary | ICD-10-CM

## 2013-05-18 DIAGNOSIS — Z125 Encounter for screening for malignant neoplasm of prostate: Secondary | ICD-10-CM

## 2013-05-18 DIAGNOSIS — Z0000 Encounter for general adult medical examination without abnormal findings: Secondary | ICD-10-CM

## 2013-05-18 DIAGNOSIS — E785 Hyperlipidemia, unspecified: Secondary | ICD-10-CM

## 2013-05-18 DIAGNOSIS — Z2911 Encounter for prophylactic immunotherapy for respiratory syncytial virus (RSV): Secondary | ICD-10-CM

## 2013-05-18 DIAGNOSIS — I10 Essential (primary) hypertension: Secondary | ICD-10-CM

## 2013-05-18 LAB — CBC WITH DIFFERENTIAL/PLATELET
Basophils Relative: 0 % (ref 0–1)
Eosinophils Absolute: 0.1 10*3/uL (ref 0.0–0.7)
HCT: 47.5 % (ref 39.0–52.0)
Hemoglobin: 16.5 g/dL (ref 13.0–17.0)
Lymphs Abs: 2.8 10*3/uL (ref 0.7–4.0)
MCH: 30 pg (ref 26.0–34.0)
MCHC: 34.7 g/dL (ref 30.0–36.0)
Monocytes Absolute: 0.6 10*3/uL (ref 0.1–1.0)
Monocytes Relative: 9 % (ref 3–12)
Neutro Abs: 3 10*3/uL (ref 1.7–7.7)

## 2013-05-18 LAB — POCT URINALYSIS DIPSTICK
Ketones, UA: NEGATIVE
Protein, UA: NEGATIVE
Spec Grav, UA: 1.01
pH, UA: 6

## 2013-05-18 MED ORDER — ATORVASTATIN CALCIUM 40 MG PO TABS: ORAL_TABLET | ORAL | Status: DC

## 2013-05-18 MED ORDER — DILTIAZEM HCL ER COATED BEADS 240 MG PO CP24: 240.0000 mg | ORAL_CAPSULE | Freq: Every day | ORAL | Status: DC

## 2013-05-18 NOTE — Progress Notes (Signed)
Subjective:    Patient ID: Maurice [name], male    DOB: [birth year], 62 y.o.   MRN: [id number]  HPI He is here for complete examination. He has a history of atrial fibrillation and is being followed by cardiology for this. He had an ablation in January but is still having some breakthrough symptoms periodically. He is followed up on a routine basis for this. He also has a family history of prostate cancer. He continues on Lipitor with no difficulty. He has had recent difficulty with swelling and discomfort of his legs and was seen in the pain clinic. He was given support hose which he says have helped a lot. He complains of pain and swelling especially when he sits for long periods of time. He also complains of bilateral hand numbness for the last 2 months it tends to go away very quickly. He states that it's his entire hand not any particular root nerve root distribution. He has no other concerns or complaints. Social and family history were reviewed.  Review of Systems Negative except as above    Objective:   Physical Exam BP 122/78  Pulse 64  Ht 5' 9.5" (1.765 m)  Wt 182 lb (82.555 kg)  BMI 26.5 kg/m2  General Appearance:    Alert, cooperative, no distress, appears stated age  Head:    Normocephalic, without obvious abnormality, atraumatic  Eyes:    PERRL, conjunctiva/corneas clear, EOM's intact, fundi    benign  Ears:    Normal TM's and external ear canals  Nose:   Nares normal, mucosa normal, no drainage or sinus   tenderness  Throat:   Lips, mucosa, and tongue normal; teeth and gums normal  Neck:   Supple, no lymphadenopathy;  thyroid:  no   enlargement/tenderness/nodules; no carotid   bruit or JVD  Back:    Spine nontender, no curvature, ROM normal, no CVA     tenderness  Lungs:     Clear to auscultation bilaterally without  rales or     ronchi; respirations unlabored  Chest Wall:    No tenderness or deformity   Heart:    Regular rate and rhythm, S1 and S2 normal, no murmur,  rub   or gallop  Breast Exam:    No chest wall tenderness, masses or gynecomastia  Abdomen:     Soft, non-tender, nondistended, normoactive bowel sounds,    no masses, no hepatosplenomegaly        Extremities:   No clubbing, cyanosis or edema,  Pulses:   2+ and symmetric all extremities  Skin:   Skin color, texture, turgor normal, no rashes or lesions  Lymph nodes:   Cervical, supraclavicular, and axillary nodes normal  Neurologic:   CNII-XII intact, normal strength, sensation and gait; reflexes 2+ and symmetric throughout          Psych:   Normal mood, affect, hygiene and grooming.           Assessment & Plan:   Routine general medical examination at a health care facility - Plan: Urinalysis Dipstick, Varicella-zoster vaccine subcutaneous, CBC with Differential, Comprehensive metabolic panel, Lipid panel, HM COLONOSCOPY  Family history of prostate cancer - Plan: PSA  HYPERLIPIDEMIA - Plan: Lipid panel, atorvastatin (LIPITOR) 40 MG tablet  Essential hypertension, benign - Plan: CBC with Differential, Comprehensive metabolic panel, Lipid panel, diltiazem (CARDIZEM CD) 240 MG 24 hr capsule  Atrial fibrillation - Plan: diltiazem (CARDIZEM CD) 240 MG 24 hr capsule  Special screening for malignant neoplasm of prostate -  Plan: PSA  Varicose veins of lower extremities with other complications  since he seems to be responding to the pressure andwheezes, air cost is noted especially in the right leg medially in the calf area. He seems to be responding to the compression stockings and apparently is scheduled to have an ablation. I think this is reasonable.

## 2013-05-19 LAB — COMPREHENSIVE METABOLIC PANEL
Albumin: 4.5 g/dL (ref 3.5–5.2)
Alkaline Phosphatase: 80 U/L (ref 39–117)
BUN: 16 mg/dL (ref 6–23)
CO2: 26 mEq/L (ref 19–32)
Glucose, Bld: 87 mg/dL (ref 70–99)
Potassium: 3.8 mEq/L (ref 3.5–5.3)

## 2013-05-19 LAB — LIPID PANEL
Cholesterol: 202 mg/dL — ABNORMAL HIGH (ref 0–200)
HDL: 54 mg/dL
LDL Cholesterol: 108 mg/dL — ABNORMAL HIGH (ref 0–99)
Total CHOL/HDL Ratio: 3.7 ratio
Triglycerides: 201 mg/dL — ABNORMAL HIGH
VLDL: 40 mg/dL (ref 0–40)

## 2013-05-19 NOTE — Progress Notes (Signed)
Quick Note:  MAILED PT LETTER OF RESULTS ______ 

## 2013-05-25 ENCOUNTER — Telehealth: Payer: Self-pay | Admitting: Family Medicine

## 2013-05-25 NOTE — Telephone Encounter (Signed)
PT DROPPED OFF A COPE OF HIS LIVING WILL. THIS IS ALREADY SCANNED UNDER DOCUMENTS. I AM SENDING BACK FOR YOU TO REVIEW AND INITIAL. THE DOCUMENT WILL BE IN YOUR FOLDER. PLEASE RETURN TO ME. Maurice [name]

## 2013-05-29 ENCOUNTER — Other Ambulatory Visit: Payer: Self-pay | Admitting: Internal Medicine

## 2013-05-31 NOTE — Telephone Encounter (Signed)
Fax Received. Refill Completed. Maurice [name] (R.M.A)   

## 2013-06-22 DIAGNOSIS — D229 Melanocytic nevi, unspecified: Secondary | ICD-10-CM

## 2013-06-22 HISTORY — DX: Melanocytic nevi, unspecified: D22.9

## 2013-07-12 ENCOUNTER — Encounter: Payer: Self-pay | Admitting: Family Medicine

## 2013-08-26 ENCOUNTER — Emergency Department (HOSPITAL_COMMUNITY): Payer: BC Managed Care – PPO

## 2013-08-26 ENCOUNTER — Observation Stay (HOSPITAL_COMMUNITY)
Admission: EM | Admit: 2013-08-26 | Discharge: 2013-08-27 | Disposition: A | Discharge status: Home / Self Care | Payer: BC Managed Care – PPO | Attending: General Surgery | Admitting: General Surgery

## 2013-08-26 ENCOUNTER — Encounter (HOSPITAL_COMMUNITY): Payer: Self-pay | Admitting: Emergency Medicine

## 2013-08-26 DIAGNOSIS — S20229A Contusion of unspecified back wall of thorax, initial encounter: Secondary | ICD-10-CM

## 2013-08-26 DIAGNOSIS — I4891 Unspecified atrial fibrillation: Secondary | ICD-10-CM | POA: Insufficient documentation

## 2013-08-26 DIAGNOSIS — Z7982 Long term (current) use of aspirin: Secondary | ICD-10-CM | POA: Insufficient documentation

## 2013-08-26 DIAGNOSIS — S20222A Contusion of left back wall of thorax, initial encounter: Secondary | ICD-10-CM | POA: Diagnosis present

## 2013-08-26 DIAGNOSIS — Z8042 Family history of malignant neoplasm of prostate: Secondary | ICD-10-CM | POA: Insufficient documentation

## 2013-08-26 DIAGNOSIS — M25519 Pain in unspecified shoulder: Secondary | ICD-10-CM | POA: Insufficient documentation

## 2013-08-26 DIAGNOSIS — E785 Hyperlipidemia, unspecified: Secondary | ICD-10-CM | POA: Insufficient documentation

## 2013-08-26 DIAGNOSIS — I10 Essential (primary) hypertension: Secondary | ICD-10-CM | POA: Insufficient documentation

## 2013-08-26 DIAGNOSIS — J939 Pneumothorax, unspecified: Secondary | ICD-10-CM

## 2013-08-26 DIAGNOSIS — S270XXA Traumatic pneumothorax, initial encounter: Secondary | ICD-10-CM

## 2013-08-26 DIAGNOSIS — W11XXXA Fall on and from ladder, initial encounter: Secondary | ICD-10-CM

## 2013-08-26 HISTORY — DX: Asymptomatic varicose veins of unspecified lower extremity: I83.90

## 2013-08-26 LAB — BASIC METABOLIC PANEL
BUN: 19 mg/dL (ref 6–23)
CO2: 23 mEq/L (ref 19–32)
Calcium: 9.6 mg/dL (ref 8.4–10.5)
Chloride: 104 mEq/L (ref 96–112)
Creatinine, Ser: 0.87 mg/dL (ref 0.50–1.35)
GFR calc Af Amer: >90 mL/min (ref >90)
GFR calc non Af Amer: >90 mL/min (ref >90)
Glucose, Bld: 94 mg/dL (ref 70–99)
Potassium: 3.7 mEq/L (ref 3.5–5.1)
Sodium: 139 mEq/L (ref 135–145)

## 2013-08-26 LAB — CBC
HCT: 44.8 % (ref 39.0–52.0)
Hemoglobin: 15.5 g/dL (ref 13.0–17.0)
MCH: 29.7 pg (ref 26.0–34.0)
MCHC: 34.6 g/dL (ref 30.0–36.0)
MCV: 85.8 fL (ref 78.0–100.0)
Platelets: 211 10*3/uL (ref 150–400)
RBC: 5.22 MIL/uL (ref 4.22–5.81)
RDW: 12.9 % (ref 11.5–15.5)
WBC: 12.1 10*3/uL — ABNORMAL HIGH (ref 4.0–10.5)

## 2013-08-26 MED ORDER — ACETAMINOPHEN 325 MG PO TABS
650.0000 mg | ORAL_TABLET | Freq: Four times a day (QID) | ORAL | Status: DC
  Administered 2013-08-26 – 2013-08-27 (×2): 650 mg via ORAL
  Filled 2013-08-26 (×2): qty 2

## 2013-08-26 MED ORDER — HYDROMORPHONE HCL PF 1 MG/ML IJ SOLN
1.0000 mg | Freq: Once | INTRAMUSCULAR | Status: AC
  Administered 2013-08-26: 1 mg via INTRAMUSCULAR
  Filled 2013-08-26: qty 1

## 2013-08-26 MED ORDER — HYDROMORPHONE HCL PF 1 MG/ML IJ SOLN
1.0000 mg | INTRAMUSCULAR | Status: DC | PRN
  Administered 2013-08-26 (×2): 1 mg via INTRAVENOUS
  Filled 2013-08-26 (×2): qty 1

## 2013-08-26 MED ORDER — ENOXAPARIN SODIUM 40 MG/0.4ML ~~LOC~~ SOLN
40.0000 mg | SUBCUTANEOUS | Status: DC
  Administered 2013-08-27: 40 mg via SUBCUTANEOUS
  Filled 2013-08-26 (×2): qty 0.4

## 2013-08-26 MED ORDER — DOCUSATE SODIUM 100 MG PO CAPS
100.0000 mg | ORAL_CAPSULE | Freq: Two times a day (BID) | ORAL | Status: DC
  Administered 2013-08-26: 100 mg via ORAL
  Filled 2013-08-26: qty 1

## 2013-08-26 MED ORDER — OXYCODONE HCL 5 MG PO TABS
5.0000 mg | ORAL_TABLET | ORAL | Status: DC | PRN
  Administered 2013-08-26: 5 mg via ORAL
  Filled 2013-08-26: qty 1

## 2013-08-26 MED ORDER — METHOCARBAMOL 750 MG PO TABS
750.0000 mg | ORAL_TABLET | Freq: Three times a day (TID) | ORAL | Status: DC
  Administered 2013-08-26: 750 mg via ORAL
  Filled 2013-08-26 (×5): qty 1

## 2013-08-26 MED ORDER — OXYCODONE HCL 5 MG PO TABS
10.0000 mg | ORAL_TABLET | ORAL | Status: DC | PRN
  Administered 2013-08-27: 10 mg via ORAL
  Filled 2013-08-26: qty 2

## 2013-08-26 MED ORDER — POTASSIUM CHLORIDE IN NACL 20-0.9 MEQ/L-% IV SOLN
INTRAVENOUS | Status: DC
  Administered 2013-08-26: 23:00:00 via INTRAVENOUS
  Filled 2013-08-26 (×3): qty 1000

## 2013-08-26 MED ORDER — ONDANSETRON HCL 4 MG PO TABS: 4.0000 mg | ORAL_TABLET | Freq: Four times a day (QID) | ORAL | Status: DC | PRN

## 2013-08-26 MED ORDER — DILTIAZEM HCL ER COATED BEADS 240 MG PO CP24
240.0000 mg | ORAL_CAPSULE | Freq: Every day | ORAL | Status: DC
  Filled 2013-08-26: qty 1

## 2013-08-26 MED ORDER — MORPHINE SULFATE 2 MG/ML IJ SOLN: 1.0000 mg | INTRAMUSCULAR | Status: DC | PRN

## 2013-08-26 MED ORDER — ONDANSETRON HCL 4 MG/2ML IJ SOLN: 4.0000 mg | Freq: Four times a day (QID) | INTRAMUSCULAR | Status: DC | PRN

## 2013-08-26 NOTE — ED Notes (Signed)
Pt presents to ed with c/o fall this morning, pt sts he fell of off the ladder (at roof level) landing on his left side. Pt reports severe pain in his left mid back and SOB. Pt went to urgent care where they did chest x ray and told him his lungs looked ok.

## 2013-08-26 NOTE — H&P (Addendum)
Pt seen and examined.  I saw the patient, participated in the history, exam and medical decision making, and concur with the physician assistant's note above.  Denies loc. Fell on l back. Hurts to breathe. No headache, vision change, LOC, extremity pain/numbness/tingling; abd pain. A little SOB.  Resting comfortably. Appears uncomfortable when tries to move A & O x3 Perrl, eomi FROM neck, ue/le bilateral cta b/l TTP L scapula - some swelling - no skin breakdown Soft, nt, nd Cn 2-12 intact, gross sensation intact  Fall  Small L ptx L back contusion  Discussed PTX with pt. Recommended tx to trauma hospital - [facility].  Admit observation Check basic labs Pain control O2 therapy VTE prophylaxis Repeat cxr in am Discussed with Dr Randa Lynn. Andrey Campanile, MD, FACS General, Bariatric, & Minimally Invasive Surgery [facility], [place]

## 2013-08-26 NOTE — ED Notes (Signed)
[facility] called for transport. 

## 2013-08-26 NOTE — ED Provider Notes (Signed)
CSN: [id number]     Arrival date & time 08/26/13  1413 History   First MD Initiated Contact with Patient 08/26/13 1458     Chief Complaint  Patient presents with  . Fall  . Back Pain   (Consider location/radiation/quality/duration/timing/severity/associated sxs/prior Treatment) HPI Patient presents after a fall with pain in his left lateral thorax.  Patient was on a ladder, fell from approximately 8/10 feet onto his left side.  No loss of consciousness, no head trauma no neck pain.  The patient had loss of breath, but no loss of consciousness. Since the event he has been ambulatory. There has been severe nonradiating pain the aforementioned area.  No attempts at relief with anything.  Pain is worse with deep inspiration.  Pain is minimally better with upright positioning. Patient saw urgent care, was referred here for further evaluation and management.  Past Medical History  Diagnosis Date  . Atrial fibrillation     paroxysmal s/p PVI by JA  . Diverticulosis   . Hypercholesterolemia   . Herpes   . History of hepatitis B    Past Surgical History  Procedure Laterality Date  . Colonoscopy  2004  . Atrail fibrillation ablation  7/10, 1//17/14    PVI by JA  . Tonsillectomy    . Tee without cardioversion  10/29/2012    Procedure: TRANSESOPHAGEAL ECHOCARDIOGRAM (TEE);  Surgeon: Pricilla Riffle, MD;  Location: [facility] ENDOSCOPY;  Service: Cardiovascular;  Laterality: N/A;  pre ablation   Family History  Problem Relation Age of Onset  . Hypertension Father   . Hypertension Brother   . Hyperlipidemia Brother   . Stroke Mother    History  Substance Use Topics  . Smoking status: Never Smoker   . Smokeless tobacco: Not on file     Comment: denies   . Alcohol Use: 0.6 oz/week    1 Cans of beer per week    Review of Systems  All other systems reviewed and are negative.    Allergies  Review of patient's allergies indicates no known allergies.  Home Medications   Current Outpatient  Rx  Name  Route  Sig  Dispense  Refill  . aspirin EC 81 MG tablet   Oral   Take 1 tablet (81 mg total) by mouth daily.   90 tablet   3   . atorvastatin (LIPITOR) 40 MG tablet      TAKE 1 TABLET EVERY DAY   30 tablet   11   . diltiazem (CARDIZEM CD) 240 MG 24 hr capsule      TAKE 1 CAPSULE (240 MG TOTAL) BY MOUTH DAILY.   30 capsule   5   . multivitamin (THERAGRAN) per tablet   Oral   Take 1 tablet by mouth daily.            BP 137/82  Pulse 61  Temp(Src) 98.1 F (36.7 C) (Oral)  Resp 18  SpO2 96% Physical Exam  Nursing note and vitals reviewed. Constitutional: He is oriented to person, place, and time. He appears well-developed.  Uncomfortable appearing, but generally well-appearing male  HENT:  Head: Normocephalic and atraumatic.  Eyes: Conjunctivae and EOM are normal.  Cardiovascular: Normal rate.  An irregularly irregular rhythm present.  Appropriate, symmetric pulses in all 4 extremities.  Pulmonary/Chest: Effort normal. No stridor. No respiratory distress.    Abdominal: He exhibits no distension.  Musculoskeletal: He exhibits no edema.       Left elbow: Normal.  Left wrist: Normal.       Right hip: Normal.       Left hip: Normal.       Arms: Neurological: He is alert and oriented to person, place, and time.  Skin: Skin is warm and dry.  Psychiatric: He has a normal mood and affect.    ED Course  Procedures (including critical care time) Labs Review Labs Reviewed - No data to display Imaging Review No results found.  EKG Interpretation   None      O2- 99%ra, nml  I reviewed the XR - interpreted the images - abnormal for PTX.  I discussed the findings with our radiologist, and our trauma team. MDM   1. Pneumothorax on left    patient presents after a fall off a ladder.  On exam the patient is awake alert, in no distress, but very uncomfortable appearing.  X-rays demonstrate pneumothorax.  Patient admission for further evaluation and  management.     Gerhard Munch, MD 08/26/13 [phone]

## 2013-08-26 NOTE — ED Notes (Signed)
hospitalist at bedside

## 2013-08-26 NOTE — H&P (Signed)
Maurice [name] is an 62 y.o. male.   Chief Complaint: SOB, left mid back pain HPI:  62 y/o male who was cleaning the leaves out of his gutters (today 08/26/13) when his ladder started to slide to the left and eventually it fell and bounced off the ground.  He landed on his left back/side which hit the basement window box.  No LOC, recalls the entire event.  He fell approximately 8-28ft.  Since the event he has been ambulatory.  Pain is worse with deep inspiration.  The morphine administered has helped with his pain.  No radiating pain.  Laying down or moving positions makes the pain worse, and sitting up seems to help the pain.  He originally went to an urgent care and was told to come the ED for further evaluation.  He came to [facility] secondary to shortness of breath and pain in his left mid-back/flank.  Pt denies any other symptoms or pain (see below ROS).  Workup showed a tiny left pneumothorax without rib fractures and left scapula xray was negative.     Past Medical History  Diagnosis Date  . Atrial fibrillation     paroxysmal s/p PVI by JA on diltiazem and 81mg  ASA  . Diverticulosis     Colonoscopy 07/2013  . Hypercholesterolemia   . Herpes   . History of hepatitis B   . Varicose veins     Past Surgical History  Procedure Laterality Date  . Colonoscopy  2004  . Atrail fibrillation ablation  7/10, 1//17/14    PVI by JA  . Tonsillectomy      when child  . Tee without cardioversion  10/29/2012    Procedure: TRANSESOPHAGEAL ECHOCARDIOGRAM (TEE);  Surgeon: Pricilla Riffle, MD;  Location: [facility] ENDOSCOPY;  Service: Cardiovascular;  Laterality: N/A;  pre ablation    Family History  Problem Relation Age of Onset  . Hypertension Father   . Hypertension Brother   . Hyperlipidemia Brother   . Stroke Mother    Social History:  reports that he has never smoked. He does not have any smokeless tobacco history on file. He reports that he drinks about 0.6 ounces of alcohol per week. He reports  that he does not use illicit drugs.  Allergies: No Known Allergies   (Not in a hospital admission)  No results found for this or any previous visit (from the past 48 hour(s)). Dg Ribs Unilateral W/chest Left  08/26/2013   CLINICAL DATA:  Pain.  Fall.  EXAM: LEFT RIBS AND CHEST - 3+ VIEW  COMPARISON:  None.  FINDINGS: Mediastinum and hilar structures are normal. Mild atelectasis versus infiltrates versus scarring lung bases. Heart size normal. No displaced rib fracture is noted. However very tiny left apical pneumothorax cannot be excluded. May be wise perform follow-up chest x-ray in 24 hr to demonstrate stability.  IMPRESSION: 1. No displaced rib fractures noted, however a very tiny left apical pneumothorax cannot be excluded and a follow-up chest x-ray in 24 hr should be considered. 2. Mild bibasilar atelectasis versus infiltrates.  Patient's physician was notified of these findings at the time of study.   Electronically Signed   By: Maisie Fus  Register   On: 08/26/2013 15:53   Dg Scapula Left  08/26/2013   CLINICAL DATA:  Status post fall.  Left shoulder pain.  EXAM: LEFT SCAPULA - 2+ VIEWS  COMPARISON:  None.  FINDINGS: No acute bony or joint abnormality is identified. There is some degenerative change about the left acromioclavicular  joint. Small left pneumothorax is identified.  IMPRESSION: Small left pneumothorax.  Negative for fracture or dislocation.  Acromioclavicular degenerative change.  Critical Value/emergent results were called by telephone at the time of interpretation on 08/26/2013 at 3:40 PM to Dr.ROBERT LOCKWOOD , who verbally acknowledged these results.   Electronically Signed   By: Drusilla Kanner M.D.   On: 08/26/2013 15:42    Review of Systems  Constitutional: Negative for fever, chills and diaphoresis.  HENT: Negative for congestion, ear discharge, ear pain, hearing loss, nosebleeds, sore throat and tinnitus.   Eyes: Negative for blurred vision, double vision, pain and  discharge.  Respiratory: Positive for shortness of breath (secondary to pain in posterior left ribs). Negative for cough and wheezing.   Cardiovascular: Negative for chest pain.  Gastrointestinal: Negative for nausea, vomiting, abdominal pain, diarrhea and blood in stool.  Genitourinary: Positive for flank pain (left flank). Negative for dysuria, urgency, frequency and hematuria.  Musculoskeletal: Positive for falls (fall off ladder). Negative for joint pain and neck pain.  Neurological: Negative for dizziness, sensory change, speech change, focal weakness, seizures, loss of consciousness, weakness and headaches.  Psychiatric/Behavioral: The patient is nervous/anxious.     Blood pressure 137/82, pulse 61, temperature 98.1 F (36.7 C), temperature source Oral, resp. rate 18, SpO2 96.00%. Physical Exam  Constitutional: He is oriented to person, place, and time. He appears well-developed and well-nourished. No distress.  HENT:  Head: Normocephalic and atraumatic.  Right Ear: External ear normal.  Left Ear: External ear normal.  Nose: Nose normal.  Mouth/Throat: Oropharynx is clear and moist. No oropharyngeal exudate.  Eyes: Conjunctivae and EOM are normal. Pupils are equal, round, and reactive to light. Right eye exhibits no discharge. Left eye exhibits no discharge. No scleral icterus.  Neck: Normal range of motion. No tracheal deviation present. No thyromegaly present.  Cardiovascular: Normal rate, regular rhythm, normal heart sounds and intact distal pulses.  Exam reveals no gallop and no friction rub.   No murmur heard. Respiratory: Effort normal and breath sounds normal. No respiratory distress. He has no wheezes. He has no rales.   He exhibits no tenderness.  GI: Soft. Bowel sounds are normal. He exhibits no distension and no mass. There is no tenderness. There is no rebound and no guarding.  Musculoskeletal: Normal range of motion. He exhibits tenderness (minimal tenderness in left  mid back with end ROM of left shoulder).  Lymphadenopathy:    He has no cervical adenopathy.  Neurological: He is alert and oriented to person, place, and time. No cranial nerve deficit. Coordination normal.  Skin: Skin is warm and dry. He is not diaphoretic. There is erythema (erythema and induration over left mid back under scapula).  Psychiatric: He has a normal mood and affect. His behavior is normal. Judgment and thought content normal.     Assessment/Plan Fall from ladder 8-8ft Tiny left traumatic pneumothorax without evidence of rib fractures Left back contusion  Plan: 1.  Admit to Trauma service, will decide about whether he needs to be transferred [facility] or can stay at [facility].  The patient prefers to stay here especially if it will cost to transfer 2.  IVF, pain control, antiemetics 3.  CXR tomorrow am, and PRN with worsening symptoms, may also need a CT chest 4.  Hopefully d/c tomorrow am if CXR stable and pain well controlled on orals.   Aris Georgia 08/26/2013, 4:27 PM

## 2013-08-27 ENCOUNTER — Encounter (HOSPITAL_COMMUNITY): Payer: Self-pay | Admitting: *Deleted

## 2013-08-27 ENCOUNTER — Observation Stay (HOSPITAL_COMMUNITY): Payer: BC Managed Care – PPO

## 2013-08-27 LAB — CBC
HCT: 42.4 % (ref 39.0–52.0)
Hemoglobin: 14.8 g/dL (ref 13.0–17.0)
MCHC: 34.9 g/dL (ref 30.0–36.0)
MCV: 86.7 fL (ref 78.0–100.0)
RBC: 4.89 MIL/uL (ref 4.22–5.81)
WBC: 8.5 10*3/uL (ref 4.0–10.5)

## 2013-08-27 MED ORDER — OXYCODONE-ACETAMINOPHEN 5-325 MG PO TABS: 1.0000 | ORAL_TABLET | ORAL | Status: DC | PRN

## 2013-08-27 MED ORDER — NAPROXEN 500 MG PO TABS: 500.0000 mg | ORAL_TABLET | Freq: Two times a day (BID) | ORAL | Status: DC

## 2013-08-27 NOTE — Progress Notes (Signed)
Patient ID: Maurice [name], male   DOB: [birth year], 62 y.o.   MRN: [id number]   LOS: 1 day   Subjective: Doing ok this morning. Describes pain as if he had broken ribs.   Objective: Vital signs in last 24 hours: Temp:  [97.8 F (36.6 C)-98.6 F (37 C)] 98.4 F (36.9 C) (11/14 0558) Pulse Rate:  [56-66] 56 (11/14 0558) Resp:  [16-18] 18 (11/14 0558) BP: (118-137)/(71-85) 119/73 mmHg (11/14 0558) SpO2:  [96 %-98 %] 98 % (11/14 0558) Weight:  [186 lb 3.2 oz (84.46 kg)] 186 lb 3.2 oz (84.46 kg) (11/13 2105) Last BM Date: 08/26/13   IS:   Laboratory  CBC  Recent Labs  08/26/13 1720 08/27/13 0535  WBC 12.1* 8.5  HGB 15.5 14.8  HCT 44.8 42.4  PLT 211 185    Radiology Results CHEST 2 VIEW  COMPARISON: August 26, 2013 at 1525 hr  FINDINGS:  The are well-expanded. Only a faint pleural line is visible today  adjacent to the posterior lateral aspects of the left 2nd and 3rd  ribs consistent with a tiny residual pneumothorax. There is no  pneumomediastinum or pleural effusion. The cardiac silhouette is  top-normal in size. The pulmonary vascularity is not engorged. The  mediastinum is normal in width. There is mild tortuosity of the  descending thoracic aorta. The observed portions of the bony thorax  exhibit no acute abnormalities. There is old deformity of the  midshaft of the left clavicle.  IMPRESSION:  The tiny left apical pneumothorax demonstrated on the previous rib  series has nearly totally resolved. There is no evidence of  pneumonia nor CHF nor pleural effusion.  Electronically Signed  By: David Swaziland  On: 08/27/2013 07:53   Physical Exam General appearance: alert and no distress Resp: clear to auscultation bilaterally Cardio: regular rate and rhythm GI: normal findings: bowel sounds normal and soft, non-tender   Assessment/Plan: Fall Left PTX -- Improved Dispo -- Home    Freeman Caldron, PA-C Pager: [phone] General Trauma PA  Pager: [phone]   08/27/2013

## 2013-08-27 NOTE — Progress Notes (Signed)
Agree Laban Orourke, MD, MPH, FACS Pager: [phone]  

## 2013-08-27 NOTE — Discharge Summary (Signed)
Maurice Cammon, MD, MPH, FACS Pager: [phone]  

## 2013-08-27 NOTE — Discharge Summary (Signed)
Physician Discharge Summary  Patient ID: Maurice [name] MRN: [id number] DOB/AGE: 62-Jun-1952 62 y.o.  Admit date: 08/26/2013 Discharge date: 08/27/2013  Discharge Diagnoses Patient Active Problem List   Diagnosis Date Noted  . Accidental fall from ladder 08/26/2013  . Traumatic pneumothorax left 08/26/2013  . Contusion of left side of mid back 08/26/2013  . Family history of prostate cancer 10/31/2011  . Diverticulitis   . ESSENTIAL HYPERTENSION, BENIGN 07/02/2010  . DIZZINESS 07/02/2010  . HYPERLIPIDEMIA 05/30/2010  . Atrial fibrillation 12/24/2008    Consultants None   Procedures None   HPI: Maurice [name] was cleaning the leaves out of his gutters when his ladder started to slide to the left and eventually it fell and bounced off the ground. He landed on his left back/side which hit the basement window box. He denied loss of consciousness and could recall the entire event. He fell approximately 8-80ft. He originally went to an urgent care and was told to come to the ED for further evaluation. He came to [facility] where workup showed a tiny left pneumothorax without rib fractures. He was transferred to [facility] for admission and observation by the trauma service.   Hospital Course: The patient did well overnight in the hospital. His pain was controlled on oral medications. A chest x-ray the following day showed marked improvement in his pneumothorax. Based on his history and x-ray findings I suspect he has at least one rib fracture that is not seen on plain x-ray. He was discharged home in stable condition.      Medication List         aspirin EC 81 MG tablet  Take 1 tablet (81 mg total) by mouth daily.     atorvastatin 40 MG tablet  Commonly known as:  LIPITOR  TAKE 1 TABLET EVERY DAY     diltiazem 240 MG 24 hr capsule  Commonly known as:  CARDIZEM CD  TAKE 1 CAPSULE (240 MG TOTAL) BY MOUTH DAILY.     multivitamin per tablet  Take 1 tablet by mouth daily.     naproxen 500 MG tablet  Commonly known as:  NAPROSYN  Take 1 tablet (500 mg total) by mouth 2 (two) times daily with a meal.     oxyCODONE-acetaminophen 5-325 MG per tablet  Commonly known as:  ROXICET  Take 1-2 tablets by mouth every 4 (four) hours as needed.             Follow-up Information   Call [facility]. (As needed)    Contact information:   [address] [place] [address] [phone]       Signed: Freeman Caldron, PA-C Pager: [phone] General Trauma PA Pager: [phone]  08/27/2013, 8:19 AM

## 2013-11-23 ENCOUNTER — Other Ambulatory Visit: Payer: Self-pay | Admitting: Internal Medicine

## 2013-12-25 ENCOUNTER — Other Ambulatory Visit: Payer: Self-pay | Admitting: Internal Medicine

## 2014-02-02 ENCOUNTER — Ambulatory Visit (INDEPENDENT_AMBULATORY_CARE_PROVIDER_SITE_OTHER): Payer: BC Managed Care – PPO | Admitting: Family Medicine

## 2014-02-02 ENCOUNTER — Encounter: Payer: Self-pay | Admitting: Family Medicine

## 2014-02-02 VITALS — BP 110/72 | HR 56 | Wt 183.0 lb

## 2014-02-02 DIAGNOSIS — K219 Gastro-esophageal reflux disease without esophagitis: Secondary | ICD-10-CM

## 2014-02-02 MED ORDER — OMEPRAZOLE 40 MG PO CPDR: 40.0000 mg | DELAYED_RELEASE_CAPSULE | Freq: Every day | ORAL | Status: DC

## 2014-02-02 NOTE — Patient Instructions (Signed)
Gastroesophageal Reflux Disease, Adult Gastroesophageal reflux disease (GERD) happens when acid from your stomach flows up into the esophagus. When acid comes in contact with the esophagus, the acid causes soreness (inflammation) in the esophagus. Over time, GERD may create small holes (ulcers) in the lining of the esophagus. CAUSES   Increased body weight. This puts pressure on the stomach, making acid rise from the stomach into the esophagus.  Smoking. This increases acid production in the stomach.  Drinking alcohol. This causes decreased pressure in the lower esophageal sphincter (valve or ring of muscle between the esophagus and stomach), allowing acid from the stomach into the esophagus.  Late evening meals and a full stomach. This increases pressure and acid production in the stomach.  A malformed lower esophageal sphincter. Sometimes, no cause is found. SYMPTOMS   Burning pain in the lower part of the mid-chest behind the breastbone and in the mid-stomach area. This may occur twice a week or more often.  Trouble swallowing.  Sore throat.  Dry cough.  Asthma-like symptoms including chest tightness, shortness of breath, or wheezing. DIAGNOSIS  Your caregiver may be able to diagnose GERD based on your symptoms. In some cases, X-rays and other tests may be done to check for complications or to check the condition of your stomach and esophagus. TREATMENT  Your caregiver may recommend over-the-counter or prescription medicines to help decrease acid production. Ask your caregiver before starting or adding any new medicines.  HOME CARE INSTRUCTIONS   Change the factors that you can control. Ask your caregiver for guidance concerning weight loss, quitting smoking, and alcohol consumption.  Avoid foods and drinks that make your symptoms worse, such as:  Caffeine or alcoholic drinks.  Chocolate.  Peppermint or mint flavorings.  Garlic and onions.  Spicy foods.  Citrus fruits,  such as oranges, lemons, or limes.  Tomato-based foods such as sauce, chili, salsa, and pizza.  Fried and fatty foods.  Avoid lying down for the 3 hours prior to your bedtime or prior to taking a nap.  Eat small, frequent meals instead of large meals.  Wear loose-fitting clothing. Do not wear anything tight around your waist that causes pressure on your stomach.  Raise the head of your bed 6 to 8 inches with wood blocks to help you sleep. Extra pillows will not help.  Only take over-the-counter or prescription medicines for pain, discomfort, or fever as directed by your caregiver.  Do not take aspirin, ibuprofen, or other nonsteroidal anti-inflammatory drugs (NSAIDs). SEEK IMMEDIATE MEDICAL CARE IF:   You have pain in your arms, neck, jaw, teeth, or back.  Your pain increases or changes in intensity or duration.  You develop nausea, vomiting, or sweating (diaphoresis).  You develop shortness of breath, or you faint.  Your vomit is green, yellow, black, or looks like coffee grounds or blood.  Your stool is red, bloody, or black. These symptoms could be signs of other problems, such as heart disease, gastric bleeding, or esophageal bleeding. MAKE SURE YOU:   Understand these instructions.  Will watch your condition.  Will get help right away if you are not doing well or get worse. Document Released: 07/10/2005 Document Revised: 12/23/2011 Document Reviewed: 04/19/2011 [facility] Patient Information 2014 [place], [place]. Take the medicine daily. Keep track of foods that she have trouble with especially with reflux or getting stuck

## 2014-02-02 NOTE — Progress Notes (Signed)
Subjective:    Patient ID: Maurice [name], male    DOB: [birth year], 63 y.o.   MRN: [id number]  HPI  [name] is a very pleasant 63 y.o. yo male who  has a past medical history of Atrial fibrillation; Diverticulosis; Hypercholesterolemia; Herpes; History of hepatitis B; and Varicose veins. He presents today for swallowing/stomach issues  The patient reports two interrelated symptoms that are bothering him. The first is that he will wake up in the middle of the night a feel like he needs to throw up. The patient describes the sensation as that of having acid in the back of his throat and having to swallow it back down. The patient states that this has occurred twice in the last 30 days, however he has had symptoms to this at least since 2010 but that the frequency of the events has increased recently. The patient has also noticed that he has had an increased feeling of mucous in his throat. He has had this feeling of mucous in the back of his throat for as long as he can remember, and states that it doesn't bother him very much. However, he does feel like it might be becoming worse since the increased frequency of his other symptoms. The patient also recalls one recent incident of mid-retrosternal burning pain. This pain felt like it was coming "from behind his heart" and self-resolved in a few minutes. The patient denies any symptoms of nausea, weakness, SOB, dizziness, crushing pain or right arm pain with the incident. The patient states that all of his symptoms are made worse by eating large amounts of dark chocolate before bed. The patient has tried both TUMS and OTC PPIs in the past, both of which provided relief of his symptoms. The patient stopped taking the PPI because he is uncomfortable taking medications daily.   The second concerning symptom the patient reports is the sensation of having food "get stuck," this feeling is then accompanied by a strong need to vomit. The  patient states that this has occurred twice in the last month or so. The feeling self resolves in a few minutes and is not associated with actual emesis. The patient has not noticed any pattern to this sensation and can't recall exactly what he was eating when this symptom occurred. The patient has not noted any aggravating or alleviating factors.    The patient denies any dry persistent cough, allergies, changes in diet, recent changes in medication, changes in stool caliber, color or frequency, recent fever, chills, vomiting, diarrhea, weight loss or diarrhea.   Review of Systems is negative except per HPI.    Objective:   Physical Exam  Constitutional: Patient is oriented to person, place, and time and well-developed, well-nourished, and in no distress. HENT: Head: Normocephalic and atraumatic. Mouth/Throat: Oropharynx is clear and moist.  Cardiovascular: Normal rate, regular rhythm. Exam reveals no murmurs, gallops and no friction rub.  Pulmonary/Chest: Effort normal and breath sounds normal. No respiratory distress. No wheezes or ronchi.   Abdominal: Soft, non-tender and non-distended. There is no rebound and no guarding.     Assessment & Plan:  GERD (gastroesophageal reflux disease) - Plan: omeprazole (PRILOSEC) 40 MG capsule Given the patient's symptoms we will try him on a PPI for one month. We will follow up with him in one month to check for relief of symptoms. If the patient's are not significantly improved at that point, will refer to GI for possible upper endoscopy.  Will discuss  the effect of chocolate and licorice on his lower esophageal sphincter.

## 2014-02-02 NOTE — Progress Notes (Deleted)
   Subjective:    Patient ID: Maurice [name], male    DOB: [birth year], 63 y.o.   MRN: [id number]  HPI  Maurice [name] is a very pleasant 63 y.o. yo male who  has a past medical history of Atrial fibrillation; Diverticulosis; Hypercholesterolemia; Herpes; History of hepatitis B; and Varicose veins. He presents today for swallowing/stomach issues  The patient reports two interrelated symptoms that are bothering him. The first is that he will wake up in the middle of the night a feel like he needs to throw up. The patient describes the sensation as that of having acid in the back of his throat and having to swallow it back down. The patient states that this has occurred twice in the last 30 days, however he has had symptoms to this at least since 2010 but that the frequency of the events has increased recently. The patient has also noticed that he has had an increased feeling of mucous in his throat. He has had this feeling of mucous in the back of his throat for as long as he can remember, and states that it doesn't bother him very much. However, he does feel like it might be becoming worse since the increased frequency of his other symptoms. The patient also recalls one recent incident of mid-retrosternal burning pain. This pain felt like it was coming "from behind his heart" and self-resolved in a few minutes. The patient denies any symptoms of nausea, weakness, SOB, dizziness, crushing pain or right arm pain with the incident. The patient states that all of his symptoms are made worse by eating large amounts of dark chocolate before bed. The patient has tried both TUMS and OTC PPIs in the past, both of which provided relief of his symptoms. The patient stopped taking the PPI because he is uncomfortable taking medications daily.   The second concerning symptom the patient reports is the sensation of having food "get stuck," this feeling is then accompanied by a strong need to vomit. The  patient states that this has occurred twice in the last month or so. The feeling self resolves in a few minutes and is not associated with actual emesis. The patient has not noticed any pattern to this sensation and can't recall exactly what he was eating when this symptom occurred. The patient has not noted any aggravating or alleviating factors.    The patient denies any dry persistent cough, allergies, changes in diet, recent changes in medication, changes in stool caliber, color or frequency, recent fever, chills, vomiting, diarrhea, weight loss or diarrhea.   Review of Systems is negative except per HPI.    Objective:   Physical Exam  Constitutional: Patient is oriented to person, place, and time and well-developed, well-nourished, and in no distress. HENT: Head: Normocephalic and atraumatic. Mouth/Throat: Oropharynx is clear and moist.  Cardiovascular: Normal rate, regular rhythm. Exam reveals no murmurs, gallops and no friction rub.  Pulmonary/Chest: Effort normal and breath sounds normal. No respiratory distress. No wheezes or ronchi.   Abdominal: Soft, non-tender and non-distended. There is no rebound and no guarding.     Assessment & Plan:  GERD (gastroesophageal reflux disease) - Plan: omeprazole (PRILOSEC) 40 MG capsule Given the patient's symptoms we will try him on a PPI for one month. We will follow up with him in one month to check for relief of symptoms. If the patient's are not significantly improved at that point, will refer to GI for possible upper endoscopy.

## 2014-02-23 ENCOUNTER — Other Ambulatory Visit: Payer: Self-pay | Admitting: Family Medicine

## 2014-02-23 NOTE — Telephone Encounter (Signed)
Is this okay to refill? Pt has appt scheduled for 03/08/14.

## 2014-03-08 ENCOUNTER — Encounter: Payer: Self-pay | Admitting: Family Medicine

## 2014-03-08 ENCOUNTER — Ambulatory Visit (INDEPENDENT_AMBULATORY_CARE_PROVIDER_SITE_OTHER): Payer: BC Managed Care – PPO | Admitting: Family Medicine

## 2014-03-08 VITALS — BP 120/72 | HR 72 | Wt 180.0 lb

## 2014-03-08 DIAGNOSIS — K219 Gastro-esophageal reflux disease without esophagitis: Secondary | ICD-10-CM

## 2014-03-08 NOTE — Progress Notes (Signed)
   Subjective:    Patient ID: Maurice [name], male    DOB: [birth year], 63 y.o.   MRN: [id number]  HPI He is here for recheck. He states that since he started to Prilosec, he has had no indigestion or acid feelings. He is also had no feeling of anything getting stuck in his chest. He is very happy with this result.   Review of Systems     Objective:   Physical Exam Alert and in no distress otherwise not examined       Assessment & Plan:  GERD (gastroesophageal reflux disease)  I recommend that he back off to every other day for week and see if this controls his symptoms and then to twice per week again seeing if his symptoms are under good control. He will let know how this works.

## 2014-03-08 NOTE — Patient Instructions (Signed)
Switch to every other day for a week and then twice per week and let see how you do

## 2014-03-25 ENCOUNTER — Other Ambulatory Visit: Payer: Self-pay | Admitting: Internal Medicine

## 2014-05-19 ENCOUNTER — Encounter: Payer: Self-pay | Admitting: Family Medicine

## 2014-05-19 ENCOUNTER — Ambulatory Visit (INDEPENDENT_AMBULATORY_CARE_PROVIDER_SITE_OTHER): Payer: BC Managed Care – PPO | Admitting: Family Medicine

## 2014-05-19 VITALS — BP 112/60 | HR 60 | Ht 68.5 in | Wt 172.0 lb

## 2014-05-19 DIAGNOSIS — Z860100 Personal history of colon polyps, unspecified: Secondary | ICD-10-CM

## 2014-05-19 DIAGNOSIS — Z0000 Encounter for general adult medical examination without abnormal findings: Secondary | ICD-10-CM

## 2014-05-19 DIAGNOSIS — I10 Essential (primary) hypertension: Secondary | ICD-10-CM

## 2014-05-19 DIAGNOSIS — E785 Hyperlipidemia, unspecified: Secondary | ICD-10-CM

## 2014-05-19 DIAGNOSIS — K5730 Diverticulosis of large intestine without perforation or abscess without bleeding: Secondary | ICD-10-CM

## 2014-05-19 DIAGNOSIS — I4891 Unspecified atrial fibrillation: Secondary | ICD-10-CM

## 2014-05-19 DIAGNOSIS — Z8042 Family history of malignant neoplasm of prostate: Secondary | ICD-10-CM

## 2014-05-19 LAB — CBC WITH DIFFERENTIAL/PLATELET
BASOS ABS: 0 10*3/uL (ref 0.0–0.1)
BASOS PCT: 0 % (ref 0–1)
Eosinophils Absolute: 0.1 10*3/uL (ref 0.0–0.7)
Eosinophils Relative: 2 % (ref 0–5)
HCT: 44.8 % (ref 39.0–52.0)
Hemoglobin: 15.4 g/dL (ref 13.0–17.0)
Lymphocytes Relative: 25 % (ref 12–46)
Lymphs Abs: 1.6 10*3/uL (ref 0.7–4.0)
MCH: 29.8 pg (ref 26.0–34.0)
MCHC: 34.4 g/dL (ref 30.0–36.0)
MCV: 86.8 fL (ref 78.0–100.0)
Monocytes Absolute: 0.7 10*3/uL (ref 0.1–1.0)
Monocytes Relative: 11 % (ref 3–12)
NEUTROS PCT: 62 % (ref 43–77)
Neutro Abs: 4 10*3/uL (ref 1.7–7.7)
PLATELETS: 221 10*3/uL (ref 150–400)
RBC: 5.16 MIL/uL (ref 4.22–5.81)
RDW: 13.8 % (ref 11.5–15.5)
WBC: 6.5 10*3/uL (ref 4.0–10.5)

## 2014-05-19 NOTE — Progress Notes (Signed)
   Subjective:    Patient ID: Maurice [name], male    DOB: 1951-08-18, 63 y.o.   MRN: [id number]  HPI He is here for complete examination. He is now using a PPI once per week and getting relief of his reflux symptoms. He sees his cardiologist regularly. He has had an ablation and does occasionally have rapid heart rate. His had difficulty recently with a hemorrhoid but says that it is slowly resolving. He did have difficulty with abdominal pain several days ago however the pain is now diminished. He continues to use Zovirax on an as-needed basis. Family and social history were reviewed. His work and home life are going quite well. He exercises regularly and does not smoke. Does drink intermittently he can recognizes difficulty controlling this.  Review of Systems  All other systems reviewed and are negative.      Objective:   Physical Exam BP 112/60  Pulse 60  Ht 5' 8.5" (1.74 m)  Wt 172 lb (78.019 kg)  BMI 25.77 kg/m2  General Appearance:    Alert, cooperative, no distress, appears stated age  Head:    Normocephalic, without obvious abnormality, atraumatic  Eyes:    PERRL, conjunctiva/corneas clear, EOM's intact, fundi    benign  Ears:    Normal TM's and external ear canals  Nose:   Nares normal, mucosa normal, no drainage or sinus   tenderness  Throat:   Lips, mucosa, and tongue normal; teeth and gums normal  Neck:   Supple, no lymphadenopathy;  thyroid:  no   enlargement/tenderness/nodules; no carotid   bruit or JVD  Back:    Spine nontender, no curvature, ROM normal, no CVA     tenderness  Lungs:     Clear to auscultation bilaterally without wheezes, rales or     ronchi; respirations unlabored  Chest Wall:    No tenderness or deformity   Heart:    Regular rate and rhythm, S1 and S2 normal, no murmur, rub   or gallop  Breast Exam:    No chest wall tenderness, masses or gynecomastia  Abdomen:     Soft, non-tender, nondistended, normoactive bowel sounds,    no masses, no  hepatosplenomegaly  Genitalia:    Normal male external genitalia without lesions.  Testicles without masses.  No inguinal hernias.  Rectal:    Normal sphincter tone, no masses or tenderness; guaiac negative stool.  Prostate smooth, no nodules, not enlarged.  Extremities:   No clubbing, cyanosis or edema  Pulses:   2+ and symmetric all extremities  Skin:   Skin color, texture, turgor normal, no rashes or lesions  Lymph nodes:   Cervical, supraclavicular, and axillary nodes normal  Neurologic:   CNII-XII intact, normal strength, sensation and gait; reflexes 2+ and symmetric throughout          Psych:   Normal mood, affect, hygiene and grooming.          Assessment & Plan:  Hx of adenomatous colonic polyps  HYPERLIPIDEMIA - Plan: Lipid panel  Family history of prostate cancer - Plan: PSA  Essential hypertension, benign - Plan: CBC with Differential, Comprehensive metabolic panel  Routine general medical examination at a health care facility - Plan: CBC with Differential, Comprehensive metabolic panel, Lipid panel, PSA  Atrial fibrillation, unspecified  Diverticulosis of large intestine without hemorrhage

## 2014-05-20 LAB — COMPREHENSIVE METABOLIC PANEL
ALK PHOS: 74 U/L (ref 39–117)
ALT: 23 U/L (ref 0–53)
AST: 16 U/L (ref 0–37)
Albumin: 4 g/dL (ref 3.5–5.2)
BUN: 17 mg/dL (ref 6–23)
CO2: 25 mEq/L (ref 19–32)
Calcium: 9.5 mg/dL (ref 8.4–10.5)
Chloride: 106 mEq/L (ref 96–112)
Creat: 0.88 mg/dL (ref 0.50–1.35)
Glucose, Bld: 106 mg/dL — ABNORMAL HIGH (ref 70–99)
Potassium: 4.3 mEq/L (ref 3.5–5.3)
SODIUM: 139 meq/L (ref 135–145)
TOTAL PROTEIN: 6.4 g/dL (ref 6.0–8.3)
Total Bilirubin: 0.7 mg/dL (ref 0.2–1.2)

## 2014-05-20 LAB — LIPID PANEL
Cholesterol: 168 mg/dL (ref 0–200)
HDL: 54 mg/dL (ref >39)
LDL CALC: 97 mg/dL (ref 0–99)
Total CHOL/HDL Ratio: 3.1 Ratio
Triglycerides: 83 mg/dL (ref <150)
VLDL: 17 mg/dL (ref 0–40)

## 2014-05-20 LAB — PSA: PSA: 0.49 ng/mL (ref <=4.00)

## 2014-05-20 MED ORDER — ATORVASTATIN CALCIUM 40 MG PO TABS: ORAL_TABLET | ORAL | Status: DC

## 2014-05-20 MED ORDER — DILTIAZEM HCL ER COATED BEADS 240 MG PO CP24: ORAL_CAPSULE | ORAL | Status: DC

## 2014-05-20 NOTE — Addendum Note (Signed)
Addended by: [name] on: 05/20/2014 08:18 AM   Modules accepted: Orders

## 2014-05-22 ENCOUNTER — Other Ambulatory Visit: Payer: Self-pay | Admitting: Internal Medicine

## 2014-07-31 ENCOUNTER — Telehealth: Payer: Self-pay | Admitting: Internal Medicine

## 2014-07-31 NOTE — Telephone Encounter (Signed)
I spoke with patient today by phone to follow-up on the results of his ablation.  He is doing very well with only rare palpitations and is very pleased with the results of his procedure.  He would like a call from [facility] to arrange for routine follow-up over the next few months.

## 2014-08-28 DIAGNOSIS — C44622 Squamous cell carcinoma of skin of right upper limb, including shoulder: Secondary | ICD-10-CM

## 2014-08-28 HISTORY — DX: Squamous cell carcinoma of skin of right upper limb, including shoulder: C44.622

## 2014-09-22 ENCOUNTER — Encounter (HOSPITAL_COMMUNITY): Payer: Self-pay | Admitting: Internal Medicine

## 2014-10-04 ENCOUNTER — Encounter: Payer: Self-pay | Admitting: Internal Medicine

## 2014-10-26 ENCOUNTER — Ambulatory Visit: Payer: BC Managed Care – PPO | Admitting: Internal Medicine

## 2014-11-02 ENCOUNTER — Ambulatory Visit (INDEPENDENT_AMBULATORY_CARE_PROVIDER_SITE_OTHER): Payer: BLUE CROSS/BLUE SHIELD | Admitting: Internal Medicine

## 2014-11-02 ENCOUNTER — Other Ambulatory Visit: Payer: Self-pay

## 2014-11-02 ENCOUNTER — Encounter: Payer: Self-pay | Admitting: Internal Medicine

## 2014-11-02 VITALS — BP 108/72 | HR 64 | Ht 69.0 in | Wt 180.6 lb

## 2014-11-02 DIAGNOSIS — I480 Paroxysmal atrial fibrillation: Secondary | ICD-10-CM

## 2014-11-02 DIAGNOSIS — R0602 Shortness of breath: Secondary | ICD-10-CM

## 2014-11-02 DIAGNOSIS — R002 Palpitations: Secondary | ICD-10-CM

## 2014-11-02 MED ORDER — DILTIAZEM HCL ER COATED BEADS 180 MG PO CP24: 180.0000 mg | ORAL_CAPSULE | Freq: Every day | ORAL | Status: DC

## 2014-11-02 NOTE — Patient Instructions (Signed)
Your physician has requested that you have an echocardiogram. Echocardiography is a painless test that uses sound waves to create images of your heart. It provides your doctor with information about the size and shape of your heart and how well your heart's chambers and valves are working. This procedure takes approximately one hour. There are no restrictions for this procedure.  Your physician has recommended you make the following change in your medication:  1) DECREASE Cardizem to 180 mg daily  Your physician wants you to follow-up in: 6 months with Dr. Rayann Heman. You will receive a reminder letter in the mail two months in advance. If you don't receive a letter, please call our office to schedule the follow-up appointment.

## 2014-11-02 NOTE — Progress Notes (Signed)
PCP: Wyatt Haste, MD  Maurice [name] is a 64 y.o. male who presents today for routine electrophysiology followup. He has only rare palpitations but no afib.  He has rare postural dizziness.  He has some chest heaviness at night when resting but denies any chest pain or SOB with activity.  Today, he denies symptoms of chest pain, shortness of breath,  lower extremity edema, dizziness, presyncope, or syncope.  The patient is otherwise without complaint today.   Past Medical History  Diagnosis Date  . Atrial fibrillation     paroxysmal s/p PVI by JA on diltiazem and 81mg  ASA  . Diverticulosis     Colonoscopy 07/2013  . Hypercholesterolemia   . Herpes   . History of hepatitis B   . Varicose veins    Past Surgical History  Procedure Laterality Date  . Colonoscopy  2004  . Atrail fibrillation ablation  7/10, 1//17/14    PVI by JA  . Tonsillectomy      when child  . Tee without cardioversion  10/29/2012    Procedure: TRANSESOPHAGEAL ECHOCARDIOGRAM (TEE);  Surgeon: Fay Records, MD;  Location: [facility] ENDOSCOPY;  Service: Cardiovascular;  Laterality: N/A;  pre ablation  . Atrial fibrillation ablation N/A 10/29/2012    Procedure: ATRIAL FIBRILLATION ABLATION;  Surgeon: Thompson Grayer, MD;  Location: [facility] CATH LAB;  Service: Cardiovascular;  Laterality: N/A;    Current Outpatient Prescriptions  Medication Sig Dispense Refill  . acyclovir (ZOVIRAX) 400 MG tablet TAKE 1 TABLET BY MOUTH THREE TIMES A DAY AS NEEDED (Patient taking differently: TAKE 1 TABLET BY MOUTH THREE TIMES A DAY AS NEEDED FOR BREAKOUTS) 30 tablet 4  . aspirin EC 81 MG tablet Take 1 tablet (81 mg total) by mouth daily. 90 tablet 3  . atorvastatin (LIPITOR) 40 MG tablet TAKE 1 TABLET EVERY DAY (Patient taking differently: TAKE 1 TABLET BY MOUTH EVERY DAY) 90 tablet 3  . diltiazem (CARDIZEM CD) 240 MG 24 hr capsule TAKE 1 CAPSULE (240 MG TOTAL) BY MOUTH DAILY. 90 capsule 3  . finasteride (PROSCAR) 5 MG tablet Take 1 tablet  by mouth daily.  5  . multivitamin (THERAGRAN) per tablet Take 1 tablet by mouth daily.      Marland Kitchen omeprazole (PRILOSEC) 40 MG capsule Take 40 mg by mouth daily as needed (acid reflux or heartburn).      No current facility-administered medications for this visit.    Physical Exam: Filed Vitals:   11/02/14 0817  BP: 108/72  Pulse: 64  Height: 5\' 9"  (1.753 m)  Weight: 180 lb 9.6 oz (81.92 kg)    GEN- The patient is well appearing, alert and oriented x 3 today.   Head- normocephalic, atraumatic Eyes-  Sclera clear, conjunctiva pink Ears- hearing intact Oropharynx- clear Lungs- Clear to ausculation bilaterally, normal work of breathing Heart- Regular rate and rhythm, no murmurs, rubs or gallops, PMI not laterally displaced GI- soft, NT, ND, + BS Extremities- no clubbing, cyanosis, or edema  ekg today reveals sinus rhythm 64 bpm, otherwise normal ekg  Assessment and Plan:  1. afib Maintaining sinus rhythm off of AAD Decrease diltiazem CD to 180mg  due to symptoms of postural dizziness and chest heaviness.   Echo to evaluate symptoms He will contact my office if symptoms worsen.  Otherwise, I will see him back in 6 months  Return in 6 months

## 2014-11-08 ENCOUNTER — Ambulatory Visit (HOSPITAL_COMMUNITY): Payer: BLUE CROSS/BLUE SHIELD | Attending: Cardiology

## 2014-11-08 DIAGNOSIS — R0602 Shortness of breath: Secondary | ICD-10-CM | POA: Diagnosis not present

## 2014-11-08 DIAGNOSIS — R002 Palpitations: Secondary | ICD-10-CM

## 2014-11-08 NOTE — Progress Notes (Signed)
2D Echo completed. 11/08/2014

## 2015-05-12 ENCOUNTER — Other Ambulatory Visit: Payer: Self-pay | Admitting: Family Medicine

## 2015-05-26 ENCOUNTER — Other Ambulatory Visit: Payer: Self-pay | Admitting: Family Medicine

## 2015-05-26 NOTE — Telephone Encounter (Signed)
Looks like he sees Dr. Redmond School every August for a physical, but does NOT have one scheduled.  Ok to refill #30, have him schedule CPE with [employer]

## 2015-05-26 NOTE — Telephone Encounter (Signed)
Is this okay to refill? 

## 2015-05-30 ENCOUNTER — Ambulatory Visit (INDEPENDENT_AMBULATORY_CARE_PROVIDER_SITE_OTHER): Payer: BLUE CROSS/BLUE SHIELD | Admitting: Internal Medicine

## 2015-05-30 VITALS — BP 118/80 | HR 67 | Temp 98.0°F | Resp 16 | Ht 69.0 in | Wt 185.0 lb

## 2015-05-30 DIAGNOSIS — H9201 Otalgia, right ear: Secondary | ICD-10-CM

## 2015-05-30 DIAGNOSIS — R42 Dizziness and giddiness: Secondary | ICD-10-CM

## 2015-05-30 MED ORDER — MECLIZINE HCL 25 MG PO TABS: 25.0000 mg | ORAL_TABLET | Freq: Every day | ORAL | Status: DC

## 2015-05-30 MED ORDER — MECLIZINE HCL 25 MG PO TABS
25.0000 mg | ORAL_TABLET | Freq: Every day | ORAL | Status: DC
Start: 2015-05-30 — End: 2015-05-30

## 2015-05-30 NOTE — Progress Notes (Signed)
Subjective:    Patient ID: Maurice [name], male    DOB: 1951-10-03, 64 y.o.   MRN: [id number]  HPI    Review of Systems     Objective:   Physical Exam        Assessment & Plan:   [facility] [address], [place] [address] [phone]  Date:  05/30/2015   Name:  Maurice [name]   DOB:  [birth year]   MRN:  [id number]  PCP:  Wyatt Haste, MD    Chief Complaint: Otalgia and Dizziness   History of Present Illness:  Maurice [name] is a 64 y.o. very pleasant male patient who presents with the following:  Right ear pain x 1 week Feels pressure like sensation, mild muffled hearing in R ear Worsening and now experiencing vertigo which started over the weekend Notices it driving and while he is walking No falls, no LOC or passed out No left ear pain No drainage from ear No fevers, no sweats or night sweats Has been swimming a lot recently No visual change No neurologic change Denies visual disturbance No new weakness or focal neurologic change Denies chest pain, SOB +irregular heart beat with known A fib on diltiazem Denies N/V   Patient Active Problem List   Diagnosis Date Noted  . Family history of prostate cancer 10/31/2011  . Diverticulosis   . ESSENTIAL HYPERTENSION, BENIGN 07/02/2010  . HYPERLIPIDEMIA 05/30/2010  . Atrial fibrillation 12/24/2008    Past Medical History  Diagnosis Date  . Atrial fibrillation     paroxysmal s/p PVI by JA on diltiazem and 81mg  ASA  . Diverticulosis     Colonoscopy 07/2013  . Hypercholesterolemia   . Herpes   . History of hepatitis B   . Varicose veins     Past Surgical History  Procedure Laterality Date  . Colonoscopy  2004  . Atrail fibrillation ablation  7/10, 1//17/14    PVI by JA  . Tonsillectomy      when child  . Tee without cardioversion  10/29/2012    Procedure: TRANSESOPHAGEAL ECHOCARDIOGRAM (TEE);  Surgeon: Fay Records, MD;  Location: [name] ENDOSCOPY;   Service: Cardiovascular;  Laterality: N/A;  pre ablation  . Atrial fibrillation ablation N/A 10/29/2012    Procedure: ATRIAL FIBRILLATION ABLATION;  Surgeon: Thompson Grayer, MD;  Location: [facility] CATH LAB;  Service: Cardiovascular;  Laterality: N/A;    Social History  Substance Use Topics  . Smoking status: Never Smoker   . Smokeless tobacco: None     Comment: denies   . Alcohol Use: 0.6 oz/week    1 Cans of beer per week    Family History  Problem Relation Age of Onset  . Hypertension Father   . Hypertension Brother   . Hyperlipidemia Brother   . Stroke Mother     No Known Allergies  Medication list has been reviewed and updated.  Current Outpatient Prescriptions on File Prior to Visit  Medication Sig Dispense Refill  . acyclovir (ZOVIRAX) 400 MG tablet TAKE 1 TABLET BY MOUTH THREE TIMES A DAY AS NEEDED 30 tablet 0  . aspirin EC 81 MG tablet Take 1 tablet (81 mg total) by mouth daily. 90 tablet 3  . atorvastatin (LIPITOR) 40 MG tablet TAKE 1 TABLET EVERY DAY 90 tablet 3  . diltiazem (CARDIZEM CD) 180 MG 24 hr capsule Take 1 capsule (180 mg total) by mouth daily. 90 capsule 3  . finasteride (PROSCAR) 5 MG tablet  Take 1 tablet by mouth daily.  5  . multivitamin (THERAGRAN) per tablet Take 1 tablet by mouth daily.      Marland Kitchen omeprazole (PRILOSEC) 40 MG capsule Take 40 mg by mouth daily as needed (acid reflux or heartburn).      No current facility-administered medications on file prior to visit.    Review of Systems:  All other pertinent ROS negative except as seen in the HPI   Physical Examination: Filed Vitals:   05/30/15 1359  BP: 118/80  Pulse: 67  Temp: 98 F (36.7 C)  Resp: 16   Filed Vitals:   05/30/15 1359  Height: 5\' 9"  (1.753 m)  Weight: 185 lb (83.915 kg)   Body mass index is 27.31 kg/(m^2). Ideal Body Weight: Weight in (lb) to have BMI = 25: 168.9  GEN: WDWN, NAD, Non-toxic, A & O x 3 HEENT: Atraumatic, Normocephalic. Neck supple. No masses, No LAD. Ears  and Nose: No external deformity. CV: RRR, No M/G/R. No JVD. No thrill. No extra heart sounds. PULM: CTA B, no wheezes, crackles, rhonchi. No retractions. No resp. distress. No accessory muscle use. ABD: S, NT, ND, +BS. No rebound. No HSM. EXTR: No c/c/e NEURO normal gait but states feels unsteady when standing and turning, CN II-XII grossly intact, no horizontal or vertical nystagmus, nml strength testing, no focal deficits, dix hallpike maneuver negative PSYCH: Normally interactive. Conversant. Not depressed or anxious appearing.  Calm demeanor.   Orthostatic VS for the past 24 hrs:  BP- Lying Pulse- Lying BP- Sitting Pulse- Sitting BP- Standing at 0 minutes Pulse- Standing at 0 minutes  05/30/15 1442 118/70 mmHg 55 112/80 mmHg 60 112/82 mmHg 63       Assessment and Plan: Ear pain, right - Plan: meclizine (ANTIVERT) 25 MG tablet, DISCONTINUED: meclizine (ANTIVERT) 25 MG tablet  Dizziness and giddiness - Plan: meclizine (ANTIVERT) 25 MG tablet, DISCONTINUED: meclizine (ANTIVERT) 25 MG tablet  With no focal neurologic deficits less concerned for any sort of abnormal neurologic process No signs of infection on exam Orthostatics nml Likely benign positional vertigo Will give trial of antivert and use afrin nasal spray laying flat at night  If no improvement or worsening consider ENT referral   Signed Ben Adams-Tiny Chaudhary, DO I have participated in the care of this patient with the Resident Physician and agree with Diagnosis and Plan as documented. He will call me if not responding to treatment this week. Jamil Armwood P. Laney Pastor, M.D.

## 2015-05-30 NOTE — Patient Instructions (Signed)
Take one 25 mg meclizine at night before bedtime If no improvement or new onset symptoms like fever, worsening vertigo, worsening pain call or return to clinic Use afrin at night laying flat leaning toward right ear x 3 days

## 2015-07-27 ENCOUNTER — Other Ambulatory Visit: Payer: Self-pay | Admitting: Family Medicine

## 2015-07-27 NOTE — Telephone Encounter (Signed)
Is this ok to refill?  

## 2015-08-01 ENCOUNTER — Telehealth: Payer: Self-pay | Admitting: Family Medicine

## 2015-08-01 MED ORDER — ACYCLOVIR 400 MG PO TABS: 400.0000 mg | ORAL_TABLET | Freq: Three times a day (TID) | ORAL | Status: DC | PRN

## 2015-08-01 NOTE — Telephone Encounter (Signed)
Pt called and made a appt for cpe in December. Needs refills of Acyclovir 400 snet to [facility]. Will need refills to last until cpe in December.

## 2015-08-01 NOTE — Telephone Encounter (Signed)
Dr.Lalonde is this okay 

## 2015-09-11 ENCOUNTER — Ambulatory Visit (INDEPENDENT_AMBULATORY_CARE_PROVIDER_SITE_OTHER): Payer: BLUE CROSS/BLUE SHIELD | Admitting: Internal Medicine

## 2015-09-11 ENCOUNTER — Encounter: Payer: Self-pay | Admitting: Internal Medicine

## 2015-09-11 VITALS — BP 138/80 | HR 92 | Ht 69.0 in | Wt 186.6 lb

## 2015-09-11 DIAGNOSIS — I480 Paroxysmal atrial fibrillation: Secondary | ICD-10-CM

## 2015-09-11 MED ORDER — FLECAINIDE ACETATE 100 MG PO TABS
100.0000 mg | ORAL_TABLET | Freq: Two times a day (BID) | ORAL | Status: DC
Start: 2015-09-11 — End: 2015-09-20

## 2015-09-11 MED ORDER — RIVAROXABAN 20 MG PO TABS
20.0000 mg | ORAL_TABLET | Freq: Every day | ORAL | Status: DC
Start: 2015-09-11 — End: 2016-03-04

## 2015-09-11 NOTE — Patient Instructions (Addendum)
Medication Instructions:  Your physician has recommended you make the following change in your medication:  1) Stop Aspirin 2) Start Xarelto 20 mg daily 3) Start Flecainide 100mg  twice daily in 3 weeks if still out of rhythm--will start immediately if returns to sinus rhythm before 3 weeks    Labwork: None ordered   Testing/Procedures: None ordered   Follow-Up:  Your physician recommends that you schedule a follow-up appointment in: 4 weeks with Roderic Palau, NP and 8 weeks with Dr Rayann Heman    Any Other Special Instructions Will Be Listed Below (If Applicable).     If you need a refill on your cardiac medications before your next appointment, please call your pharmacy.

## 2015-09-11 NOTE — Progress Notes (Signed)
PCP: Wyatt Haste, MD  Maurice [name] is a 64 y.o. male who presents today for electrophysiology followup. He has returned to afib.  He reports multiple episodes per month which he has confirmed with an [employer] app on his phone.  He reports fatigue in afib.  He also feels "nervous".  He thinks he has been in afib now since Friday.  Today, he denies symptoms of chest pain, shortness of breath,  lower extremity edema, dizziness, presyncope, or syncope.  The patient is otherwise without complaint today.   Past Medical History  Diagnosis Date  . Atrial fibrillation (HCC)     paroxysmal s/p PVI by JA on diltiazem and 81mg  ASA  . Diverticulosis     Colonoscopy 07/2013  . Hypercholesterolemia   . Herpes   . History of hepatitis B   . Varicose veins    Past Surgical History  Procedure Laterality Date  . Colonoscopy  2004  . Atrail fibrillation ablation  7/10, 1//17/14    PVI by JA  . Tonsillectomy      when child  . Tee without cardioversion  10/29/2012    Procedure: TRANSESOPHAGEAL ECHOCARDIOGRAM (TEE);  Surgeon: Fay Records, MD;  Location: [facility] ENDOSCOPY;  Service: Cardiovascular;  Laterality: N/A;  pre ablation  . Atrial fibrillation ablation N/A 10/29/2012    Procedure: ATRIAL FIBRILLATION ABLATION;  Surgeon: Thompson Grayer, MD;  Location: [facility] CATH LAB;  Service: Cardiovascular;  Laterality: N/A;    Current Outpatient Prescriptions  Medication Sig Dispense Refill  . acyclovir (ZOVIRAX) 400 MG tablet Take 1 tablet (400 mg total) by mouth 3 (three) times daily as needed. 30 tablet 5  . atorvastatin (LIPITOR) 40 MG tablet Take 40 mg by mouth daily.    Marland Kitchen diltiazem (CARDIZEM CD) 180 MG 24 hr capsule Take 1 capsule (180 mg total) by mouth daily. 90 capsule 3  . finasteride (PROSCAR) 5 MG tablet Take 1 tablet by mouth daily.  5  . meclizine (ANTIVERT) 25 MG tablet Take 25 mg by mouth 3 (three) times daily as needed for dizziness.    . multivitamin (THERAGRAN) per tablet Take 1 tablet  by mouth daily.      Marland Kitchen omeprazole (PRILOSEC) 40 MG capsule Take 40 mg by mouth daily as needed (acid reflux or heartburn).     . flecainide (TAMBOCOR) 100 MG tablet Take 1 tablet (100 mg total) by mouth 2 (two) times daily. 180 tablet 3  . rivaroxaban (XARELTO) 20 MG TABS tablet Take 1 tablet (20 mg total) by mouth daily with supper. 30 tablet 11   No current facility-administered medications for this visit.    Physical Exam: Filed Vitals:   09/11/15 1045  BP: 138/80  Pulse: 92  Height: 5\' 9"  (1.753 m)  Weight: 186 lb 9.6 oz (84.641 kg)    GEN- The patient is well appearing, alert and oriented x 3 today.   Head- normocephalic, atraumatic Eyes-  Sclera clear, conjunctiva pink Ears- hearing intact Oropharynx- clear Lungs- Clear to ausculation bilaterally, normal work of breathing Heart- Regular rate and rhythm, no murmurs, rubs or gallops, PMI not laterally displaced GI- soft, NT, ND, + BS Extremities- no clubbing, cyanosis, or edema  ekg today reveals afib, V rate 92 bpm, Qtc 413 msec  Assessment and Plan:  1. afib He has returned to Afib Stop asa. Though his chads2vasc score is 0, I would recommend anticoagulation while we are pursuing sinus rhythm. Today, I discussed Coumadin as well as novel anticoagulants including Pradaxa, Xarelto,  and Eliquis today as indicated for risk reduction in stroke and systemic emboli with nonvalvular atrial fibrillation.  Risks, benefits, and alternatives to each of these drugs were discussed at length today.  He would prefer once daily xarelto. I will therefore start xarelto 20mg  daily today. Once he has taken xarelto for 3 weeks, we will start flecainide 100mg  BID.  He will continue to monitor with [employer] app and if he converts to sinus sooner than 3 weeks, he will start flecainide at that time. He will follow-up with Butch Penny in the AF clinic in 4 weeks.  If he remains in afib at that time, he will require cardioversion. He has had af ablation  2010 and 2014 but with very good response.  If he continues to have issues with afib, we will consider repeat ablation in the future.  Today, I have spent 25 minutes with the patient discussing afib .  More than 50% of the visit time today was spent on this issue.    Follow-up in AF clinic in 4 weeks I will see again in 8 weeks  Thompson Grayer MD, [facility] 09/11/2015 11:39 AM

## 2015-09-15 ENCOUNTER — Telehealth: Payer: Self-pay | Admitting: Internal Medicine

## 2015-09-15 MED ORDER — DILTIAZEM HCL ER COATED BEADS 240 MG PO CP24: 240.0000 mg | ORAL_CAPSULE | Freq: Every day | ORAL | Status: DC

## 2015-09-15 NOTE — Telephone Encounter (Signed)
Spoke with patient and let him know that he can not start the Flecainide if still out of rhythm.  He needs to be on the Xarelto for 3 weeks before starting.  He says his HR is staying around 110-120 and he feels terrible.  I have discussed with Dr Rayann Heman and he suggest to increase his Cardizem to 240mg  daily and follow up with Roderic Palau, NP on Mon or Tues of next week.  If still out of rhythm he can be set up for a TEE guided DCCV.  Patient is aware and I have given him an appointment for Tues at [place].  He was give parking garage code and the number to the clinic.  Prescription for 240mg  of Diltiazem was called in.  If he converts to NSR may need to drop back to the Diltiazem 180mg   Patient verbalized understanding and agrees with plan.

## 2015-09-15 NOTE — Telephone Encounter (Signed)
New Message   Pt wants to start the Flecainide now instead of later    He wants a call back from rn

## 2015-09-19 ENCOUNTER — Encounter (HOSPITAL_COMMUNITY): Payer: Self-pay | Admitting: Nurse Practitioner

## 2015-09-19 ENCOUNTER — Ambulatory Visit (HOSPITAL_COMMUNITY)
Admission: RE | Admit: 2015-09-19 | Discharge: 2015-09-19 | Disposition: A | Discharge status: Home / Self Care | Payer: BLUE CROSS/BLUE SHIELD | Source: Ambulatory Visit | Attending: Nurse Practitioner | Admitting: Nurse Practitioner

## 2015-09-19 VITALS — BP 136/82 | HR 88 | Ht 69.0 in | Wt 184.6 lb

## 2015-09-19 DIAGNOSIS — I481 Persistent atrial fibrillation: Secondary | ICD-10-CM | POA: Diagnosis present

## 2015-09-19 DIAGNOSIS — I4819 Other persistent atrial fibrillation: Secondary | ICD-10-CM

## 2015-09-19 LAB — BASIC METABOLIC PANEL
Anion gap: 9 (ref 5–15)
BUN: 15 mg/dL (ref 6–20)
CALCIUM: 9.4 mg/dL (ref 8.9–10.3)
CO2: 24 mmol/L (ref 22–32)
CREATININE: 0.87 mg/dL (ref 0.61–1.24)
Chloride: 107 mmol/L (ref 101–111)
GFR calc Af Amer: >60 mL/min (ref >60)
GFR calc non Af Amer: >60 mL/min (ref >60)
GLUCOSE: 116 mg/dL — AB (ref 65–99)
Potassium: 4.1 mmol/L (ref 3.5–5.1)
Sodium: 140 mmol/L (ref 135–145)

## 2015-09-19 LAB — CBC
HEMATOCRIT: 47.9 % (ref 39.0–52.0)
Hemoglobin: 16.5 g/dL (ref 13.0–17.0)
MCH: 30.2 pg (ref 26.0–34.0)
MCHC: 34.4 g/dL (ref 30.0–36.0)
MCV: 87.6 fL (ref 78.0–100.0)
Platelets: 195 10*3/uL (ref 150–400)
RBC: 5.47 MIL/uL (ref 4.22–5.81)
RDW: 13 % (ref 11.5–15.5)
WBC: 5.8 10*3/uL (ref 4.0–10.5)

## 2015-09-19 NOTE — Progress Notes (Signed)
TEE preauthorization # [id number] approved 09/19/15-10/18/15

## 2015-09-19 NOTE — Anesthesia Preprocedure Evaluation (Addendum)
Anesthesia Evaluation  Patient identified by MRN, date of birth, ID band Patient awake    Reviewed: Allergy & Precautions, H&P , NPO status , Patient's Chart, lab work & pertinent test results  History of Anesthesia Complications Negative for: history of anesthetic complications  Airway Mallampati: I  TM Distance: >3 FB Neck ROM: Full    Dental  (+) Teeth Intact, Dental Advisory Given   Pulmonary neg pulmonary ROS,    breath sounds clear to auscultation       Cardiovascular hypertension, Pt. on medications Normal cardiovascular exam+ dysrhythmias Atrial Fibrillation  Rhythm:Irregular Rate:Normal  Ablation x 2 in past, ECHO with EF 60% in past   Neuro/Psych negative neurological ROS     GI/Hepatic negative GI ROS, Neg liver ROS,   Endo/Other  negative endocrine ROS  Renal/GU negative Renal ROS     Musculoskeletal   Abdominal   Peds  Hematology negative hematology ROS (+) 16/47   Anesthesia Other Findings   Reproductive/Obstetrics                            Anesthesia Physical  Anesthesia Plan  ASA: II  Anesthesia Plan: MAC   Post-op Pain Management:    Induction: Intravenous  Airway Management Planned: Nasal Cannula and Natural Airway  Additional Equipment: None  Intra-op Plan:   Post-operative Plan:   Informed Consent: I have reviewed the patients History and Physical, chart, labs and discussed the procedure including the risks, benefits and alternatives for the proposed anesthesia with the patient or authorized representative who has indicated his/her understanding and acceptance.   Dental advisory given  Plan Discussed with: CRNA and Anesthesiologist  Anesthesia Plan Comments:        Anesthesia Quick Evaluation

## 2015-09-19 NOTE — Patient Instructions (Signed)
Cardioversion scheduled for Wednesday, December 7th  - Arrive at the [facility] and go to admitting at 12PM  -Do not eat or drink anything after midnight the night prior to your procedure.  - Take all your medication with a sip of water prior to arrival.  - You will not be able to drive home after your procedure.   After cardioversion  1)reduce Cardizem to 180mg  once daily  2)Start flecainide 100mg  twice daily

## 2015-09-19 NOTE — Progress Notes (Signed)
Patient ID: Maurice [name], male   DOB: 07/29/1951, 64 y.o.   MRN: [id number]     Primary Care Physician: Wyatt Haste, MD Referring Physician: Dr. Wynell Balloon is a 64 y.o. male with a h/o persistent afib that had ablation x 2, 2010 and 2014. He presented to Dr. Rayann Heman 11/28 in afib, thinking he had been in afib for several days and c/o of  fatigue and feeling nervous. Chadsvasc score was 0 but he was started on xarelto for possible DCCV or chemical cardioversion. He was to take drug for 3 weeks and then start flecainide 100 mg bid and if did not convert, schedule for DCCV. He called back to Dr. Jackalyn Lombard office recently and was still having afib with RVR and feeling poorly. He was instructed to increase cardizem to 240 mg and if he did not feel better with rate control, he could be seen in the afib clinic and arrange for a TEE guided cardioversion.   Today, he is in afib clinic with rate controlled at 88 bpm, however he would like to go ahead and be scheduled for DCCV. He has had 8 doses of xarelto. He is aware that he will have to have a TTE guided cardioversion, understands the risk vrs benefit of procedure. He is planning on going out of town Thursday and would like to get back into rhythm prior to that. He Will be started on flecainide after cardioversion.    Today, he denies symptoms of palpitations, chest pain, shortness of breath, orthopnea, PND, lower extremity edema, dizziness, presyncope, syncope, or neurologic sequela. The patient is tolerating medications without difficulties and is otherwise without complaint today.   Past Medical History  Diagnosis Date  . Atrial fibrillation (HCC)     paroxysmal s/p PVI by JA on diltiazem and 81mg  ASA  . Diverticulosis     Colonoscopy 07/2013  . Hypercholesterolemia   . Herpes   . History of hepatitis B   . Varicose veins    Past Surgical History  Procedure Laterality Date  . Colonoscopy  2004  . Atrail  fibrillation ablation  7/10, 1//17/14    PVI by JA  . Tonsillectomy      when child  . Tee without cardioversion  10/29/2012    Procedure: TRANSESOPHAGEAL ECHOCARDIOGRAM (TEE);  Surgeon: Fay Records, MD;  Location: [facility] ENDOSCOPY;  Service: Cardiovascular;  Laterality: N/A;  pre ablation  . Atrial fibrillation ablation N/A 10/29/2012    Procedure: ATRIAL FIBRILLATION ABLATION;  Surgeon: Thompson Grayer, MD;  Location: [facility] CATH LAB;  Service: Cardiovascular;  Laterality: N/A;    Current Outpatient Prescriptions  Medication Sig Dispense Refill  . atorvastatin (LIPITOR) 40 MG tablet Take 40 mg by mouth daily.    Marland Kitchen diltiazem (CARDIZEM CD) 240 MG 24 hr capsule Take 1 capsule (240 mg total) by mouth daily. 30 capsule 1  . finasteride (PROSCAR) 5 MG tablet Take 1 tablet by mouth daily.  5  . flecainide (TAMBOCOR) 100 MG tablet Take 1 tablet (100 mg total) by mouth 2 (two) times daily. 180 tablet 3  . meclizine (ANTIVERT) 25 MG tablet Take 25 mg by mouth 3 (three) times daily as needed for dizziness.    Marland Kitchen omeprazole (PRILOSEC) 40 MG capsule Take 40 mg by mouth daily as needed (acid reflux or heartburn).     . rivaroxaban (XARELTO) 20 MG TABS tablet Take 1 tablet (20 mg total) by mouth daily with supper. 30 tablet 11  . acyclovir (  ZOVIRAX) 400 MG tablet Take 1 tablet (400 mg total) by mouth 3 (three) times daily as needed. (Patient not taking: Reported on 09/19/2015) 30 tablet 5  . multivitamin (THERAGRAN) per tablet Take 1 tablet by mouth daily.       No current facility-administered medications for this encounter.    No Known Allergies  Social History   Social History  . Marital Status: Single    Spouse Name: N/A  . Number of Children: N/A  . Years of Education: N/A   Occupational History  . 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 Smoking status: Never Smoker   . Smokeless tobacco: Not on file     Comment: denies   . Alcohol Use: 0.6 oz/week    1 Cans of beer per  week  . Drug Use: No  . Sexual Activity: Yes   Other Topics Concern  . Not on file   Social History Narrative    Family History  Problem Relation Age of Onset  . Hypertension Father   . Hypertension Brother   . Hyperlipidemia Brother   . Stroke Mother     ROS- All systems are reviewed and negative except as per the HPI above  Physical Exam: Filed Vitals:   09/19/15 0907  BP: 136/82  Pulse: 88  Height: 5\' 9"  (1.753 m)  Weight: 184 lb 9.6 oz (83.734 kg)    GEN- The patient is well appearing, alert and oriented x 3 today.   Head- normocephalic, atraumatic Eyes-  Sclera clear, conjunctiva pink Ears- hearing intact Oropharynx- clear Neck- supple, no JVP Lymph- no cervical lymphadenopathy Lungs- Clear to ausculation bilaterally, normal work of breathing Heart- Irregular rate and rhythm, no murmurs, rubs or gallops, PMI not laterally displaced GI- soft, NT, ND, + BS Extremities- no clubbing, cyanosis, or edema MS- no significant deformity or atrophy Skin- no rash or lesion Psych- euthymic mood, full affect Neuro- strength and sensation are intact  EKG-afib with a competing junctional pacemaker, v rate 88 bpm. QRS int 82 ms, QTc 440 ms,   RAD Dr. Jackalyn Lombard note/phone calls reviewed    Assessment and Plan: 1. Persistent symptomatic afib Chadsvasc score is 0, started on xarleto 8 days ago He prefers to have a DCCV now, scheduled for tomorrow at 1pm, instead of waiting for  full anticoagulation x 3 weeks. Per Dr. Rayann Heman, he can go ahead with DCCV, but will need TEE guided cardioversion, risk vrs benefit procedure discussed with pt. After cardioversion, he will decrease cardizem back to 180 mg a day and will start flecanide 100 mg bid  He will be seen back early next week and will be set up for GXT. Continue xarelto 20 mg qd for at least 30 days past cardioversion. Bmet/cbc today  Geroge Baseman. Nikitta Sobiech, [facility] [address] [place], [place] [address] [phone]

## 2015-09-20 ENCOUNTER — Encounter (HOSPITAL_COMMUNITY): Payer: Self-pay | Admitting: *Deleted

## 2015-09-20 ENCOUNTER — Other Ambulatory Visit: Payer: Self-pay

## 2015-09-20 ENCOUNTER — Encounter (HOSPITAL_COMMUNITY)
Admission: RE | Disposition: A | Discharge status: Home / Self Care | Payer: Self-pay | Source: Ambulatory Visit | Attending: Cardiovascular Disease

## 2015-09-20 ENCOUNTER — Ambulatory Visit (HOSPITAL_COMMUNITY): Payer: BLUE CROSS/BLUE SHIELD | Admitting: Anesthesiology

## 2015-09-20 ENCOUNTER — Ambulatory Visit (HOSPITAL_BASED_OUTPATIENT_CLINIC_OR_DEPARTMENT_OTHER)
Admission: RE | Admit: 2015-09-20 | Discharge: 2015-09-20 | Disposition: A | Discharge status: Home / Self Care | Payer: BLUE CROSS/BLUE SHIELD | Source: Ambulatory Visit | Attending: Nurse Practitioner | Admitting: Nurse Practitioner

## 2015-09-20 ENCOUNTER — Ambulatory Visit (HOSPITAL_COMMUNITY)
Admission: RE | Admit: 2015-09-20 | Discharge: 2015-09-20 | Disposition: A | Discharge status: Home / Self Care | Payer: BLUE CROSS/BLUE SHIELD | Source: Ambulatory Visit | Attending: Cardiovascular Disease | Admitting: Cardiovascular Disease

## 2015-09-20 DIAGNOSIS — I4891 Unspecified atrial fibrillation: Secondary | ICD-10-CM | POA: Diagnosis not present

## 2015-09-20 DIAGNOSIS — E7800 Pure hypercholesterolemia, unspecified: Secondary | ICD-10-CM | POA: Diagnosis not present

## 2015-09-20 DIAGNOSIS — I10 Essential (primary) hypertension: Secondary | ICD-10-CM | POA: Diagnosis not present

## 2015-09-20 DIAGNOSIS — Z7901 Long term (current) use of anticoagulants: Secondary | ICD-10-CM | POA: Diagnosis not present

## 2015-09-20 DIAGNOSIS — I340 Nonrheumatic mitral (valve) insufficiency: Secondary | ICD-10-CM | POA: Diagnosis not present

## 2015-09-20 DIAGNOSIS — Z79899 Other long term (current) drug therapy: Secondary | ICD-10-CM | POA: Diagnosis not present

## 2015-09-20 DIAGNOSIS — I480 Paroxysmal atrial fibrillation: Secondary | ICD-10-CM

## 2015-09-20 HISTORY — PX: CARDIOVERSION: SHX1299

## 2015-09-20 HISTORY — PX: TEE WITHOUT CARDIOVERSION: SHX5443

## 2015-09-20 SURGERY — CARDIOVERSION
Anesthesia: Monitor Anesthesia Care

## 2015-09-20 MED ORDER — LACTATED RINGERS IV SOLN
INTRAVENOUS | Status: DC
  Administered 2015-09-20: 12:00:00 via INTRAVENOUS

## 2015-09-20 MED ORDER — FLECAINIDE ACETATE 100 MG PO TABS: 50.0000 mg | ORAL_TABLET | Freq: Two times a day (BID) | ORAL | Status: DC

## 2015-09-20 MED ORDER — PROPOFOL 10 MG/ML IV BOLUS
INTRAVENOUS | Status: DC | PRN
  Administered 2015-09-20: 30 mg via INTRAVENOUS

## 2015-09-20 MED ORDER — LIDOCAINE HCL (CARDIAC) 20 MG/ML IV SOLN
INTRAVENOUS | Status: DC | PRN
  Administered 2015-09-20: 80 mg via INTRAVENOUS

## 2015-09-20 MED ORDER — DILTIAZEM HCL ER COATED BEADS 180 MG PO CP24: 180.0000 mg | ORAL_CAPSULE | Freq: Every day | ORAL | Status: DC

## 2015-09-20 MED ORDER — PROPOFOL 500 MG/50ML IV EMUL
INTRAVENOUS | Status: DC | PRN
  Administered 2015-09-20: 120 ug/kg/min via INTRAVENOUS

## 2015-09-20 MED ORDER — SODIUM CHLORIDE 0.9 % IV SOLN: INTRAVENOUS | Status: DC

## 2015-09-20 NOTE — Transfer of Care (Signed)
Immediate Anesthesia Transfer of Care Note  Patient: Maurice [name]  Procedure(s) Performed: Procedure(s): CARDIOVERSION (N/A) TRANSESOPHAGEAL ECHOCARDIOGRAM (TEE) (N/A)  Patient Location: Endoscopy Unit  Anesthesia Type:MAC  Level of Consciousness: awake, alert , oriented and patient cooperative  Airway & Oxygen Therapy: Patient Spontanous Breathing  Post-op Assessment: Report given to RN, Post -op Vital signs reviewed and stable and Patient moving all extremities  Post vital signs: Reviewed and stable  Last Vitals:  Filed Vitals:   09/20/15 1200  BP: 143/88  Pulse: 111  Temp: 36.6 C  Resp: 17    Complications: No apparent anesthesia complications

## 2015-09-20 NOTE — Discharge Instructions (Signed)

## 2015-09-20 NOTE — CV Procedure (Signed)
    Transesophageal Echocardiogram Note  GRAYTON BEAVIN [id number] [birth year]  Procedure: Transesophageal Echocardiogram Indications: atrial fib   Procedure Details Consent: Obtained Time Out: Verified patient identification, verified procedure, site/side was marked, verified correct patient position, special equipment/implants available, Radiology Safety Procedures followed,  medications/allergies/relevent history reviewed, required imaging and test results available.  Performed  Medications: Propofol 117 mg iv for TEE and CArdioversion   Left Ventrical:  Normal LV function   Mitral Valve: mod. MR   Aortic Valve: normal valve   Tricuspid Valve: trace TR   Pulmonic Valve: trace - mild PI  Left Atrium/ Left atrial appendage: large, no thrombi   Atrial septum: no ASD or PFO by color doppler   Aorta: mild atherosclerosis   Complications: No apparent complications Patient did tolerate procedure well.  Will proceed with cardioversion      Cardioversion Note  DACARI HOFLAND [id number] August 11, 1951  Procedure: DC Cardioversion Indications: atrial fib   Procedure Details Consent: Obtained Time Out: Verified patient identification, verified procedure, site/side was marked, verified correct patient position, special equipment/implants available, Radiology Safety Procedures followed,  medications/allergies/relevent history reviewed, required imaging and test results available.  Performed  The patient has been on adequate anticoagulation.  The patient received IV Propofol ( see above )  for sedation.  Synchronous cardioversion was performed at 120  joules.  The cardioversion was successful     Complications: No apparent complications Patient did tolerate procedure well.   Thayer Headings, Brooke Bonito., MD, [facility] 09/20/2015, 1:33 PM

## 2015-09-20 NOTE — Anesthesia Postprocedure Evaluation (Signed)
Anesthesia Post Note  Patient: BRODIN ALSOP  Procedure(s) Performed: Procedure(s) (LRB): CARDIOVERSION (N/A) TRANSESOPHAGEAL ECHOCARDIOGRAM (TEE) (N/A)  Patient location during evaluation: Endoscopy Anesthesia Type: MAC Level of consciousness: awake and alert Pain management: pain level controlled Vital Signs Assessment: post-procedure vital signs reviewed and stable Respiratory status: spontaneous breathing Cardiovascular status: stable Postop Assessment: no signs of nausea or vomiting Anesthetic complications: no    Last Vitals:  Filed Vitals:   09/20/15 1350 09/20/15 1400  BP: 115/84 128/91  Pulse: 97 93  Temp:    Resp: 15 15    Last Pain: There were no vitals filed for this visit.               Jarick Harkins

## 2015-09-20 NOTE — Progress Notes (Signed)
  Echocardiogram Echocardiogram Transesophageal has been performed.  Jennette Dubin 09/20/2015, 2:03 PM

## 2015-09-21 ENCOUNTER — Encounter (HOSPITAL_COMMUNITY): Payer: Self-pay | Admitting: Cardiovascular Disease

## 2015-09-24 NOTE — Interval H&P Note (Signed)
History and Physical Interval Note:  09/24/2015 1:47 PM  Maurice [name]  has presented today for surgery, with the diagnosis of afib  The various methods of treatment have been discussed with the patient and family. After consideration of risks, benefits and other options for treatment, the patient has consented to  Procedure(s): CARDIOVERSION (N/A) TRANSESOPHAGEAL ECHOCARDIOGRAM (TEE) (N/A) as a surgical intervention .  The patient's history has been reviewed, patient examined, no change in status, stable for surgery.  I have reviewed the patient's chart and labs.  Questions were answered to the patient's satisfaction.     [name], Wonda Cheng

## 2015-09-24 NOTE — H&P (View-Only) (Signed)
PCP: Wyatt Haste, MD  Maurice [name] is a 64 y.o. male who presents today for electrophysiology followup. He has returned to afib.  He reports multiple episodes per month which he has confirmed with an [employer] app on his phone.  He reports fatigue in afib.  He also feels "nervous".  He thinks he has been in afib now since Friday.  Today, he denies symptoms of chest pain, shortness of breath,  lower extremity edema, dizziness, presyncope, or syncope.  The patient is otherwise without complaint today.   Past Medical History  Diagnosis Date  . Atrial fibrillation (HCC)     paroxysmal s/p PVI by JA on diltiazem and 81mg  ASA  . Diverticulosis     Colonoscopy 07/2013  . Hypercholesterolemia   . Herpes   . History of hepatitis B   . Varicose veins    Past Surgical History  Procedure Laterality Date  . Colonoscopy  2004  . Atrail fibrillation ablation  7/10, 1//17/14    PVI by JA  . Tonsillectomy      when child  . Tee without cardioversion  10/29/2012    Procedure: TRANSESOPHAGEAL ECHOCARDIOGRAM (TEE);  Surgeon: Fay Records, MD;  Location: [facility] ENDOSCOPY;  Service: Cardiovascular;  Laterality: N/A;  pre ablation  . Atrial fibrillation ablation N/A 10/29/2012    Procedure: ATRIAL FIBRILLATION ABLATION;  Surgeon: Thompson Grayer, MD;  Location: [facility] CATH LAB;  Service: Cardiovascular;  Laterality: N/A;    Current Outpatient Prescriptions  Medication Sig Dispense Refill  . acyclovir (ZOVIRAX) 400 MG tablet Take 1 tablet (400 mg total) by mouth 3 (three) times daily as needed. 30 tablet 5  . atorvastatin (LIPITOR) 40 MG tablet Take 40 mg by mouth daily.    Marland Kitchen diltiazem (CARDIZEM CD) 180 MG 24 hr capsule Take 1 capsule (180 mg total) by mouth daily. 90 capsule 3  . finasteride (PROSCAR) 5 MG tablet Take 1 tablet by mouth daily.  5  . meclizine (ANTIVERT) 25 MG tablet Take 25 mg by mouth 3 (three) times daily as needed for dizziness.    . multivitamin (THERAGRAN) per tablet Take 1 tablet  by mouth daily.      Marland Kitchen omeprazole (PRILOSEC) 40 MG capsule Take 40 mg by mouth daily as needed (acid reflux or heartburn).     . flecainide (TAMBOCOR) 100 MG tablet Take 1 tablet (100 mg total) by mouth 2 (two) times daily. 180 tablet 3  . rivaroxaban (XARELTO) 20 MG TABS tablet Take 1 tablet (20 mg total) by mouth daily with supper. 30 tablet 11   No current facility-administered medications for this visit.    Physical Exam: Filed Vitals:   09/11/15 1045  BP: 138/80  Pulse: 92  Height: 5\' 9"  (1.753 m)  Weight: 186 lb 9.6 oz (84.641 kg)    GEN- The patient is well appearing, alert and oriented x 3 today.   Head- normocephalic, atraumatic Eyes-  Sclera clear, conjunctiva pink Ears- hearing intact Oropharynx- clear Lungs- Clear to ausculation bilaterally, normal work of breathing Heart- Regular rate and rhythm, no murmurs, rubs or gallops, PMI not laterally displaced GI- soft, NT, ND, + BS Extremities- no clubbing, cyanosis, or edema  ekg today reveals afib, V rate 92 bpm, Qtc 413 msec  Assessment and Plan:  1. afib He has returned to Afib Stop asa. Though his chads2vasc score is 0, I would recommend anticoagulation while we are pursuing sinus rhythm. Today, I discussed Coumadin as well as novel anticoagulants including Pradaxa, Xarelto,  and Eliquis today as indicated for risk reduction in stroke and systemic emboli with nonvalvular atrial fibrillation.  Risks, benefits, and alternatives to each of these drugs were discussed at length today.  He would prefer once daily xarelto. I will therefore start xarelto 20mg  daily today. Once he has taken xarelto for 3 weeks, we will start flecainide 100mg  BID.  He will continue to monitor with [employer] app and if he converts to sinus sooner than 3 weeks, he will start flecainide at that time. He will follow-up with Butch Penny in the AF clinic in 4 weeks.  If he remains in afib at that time, he will require cardioversion. He has had af ablation  2010 and 2014 but with very good response.  If he continues to have issues with afib, we will consider repeat ablation in the future.  Today, I have spent 25 minutes with the patient discussing afib .  More than 50% of the visit time today was spent on this issue.    Follow-up in AF clinic in 4 weeks I will see again in 8 weeks  Thompson Grayer MD, [facility] 09/11/2015 11:39 AM

## 2015-09-26 ENCOUNTER — Telehealth (HOSPITAL_COMMUNITY): Payer: Self-pay | Admitting: *Deleted

## 2015-09-26 ENCOUNTER — Encounter (HOSPITAL_COMMUNITY): Payer: Self-pay | Admitting: Nurse Practitioner

## 2015-09-26 ENCOUNTER — Ambulatory Visit (HOSPITAL_COMMUNITY)
Admission: RE | Admit: 2015-09-26 | Discharge: 2015-09-26 | Disposition: A | Discharge status: Home / Self Care | Payer: BLUE CROSS/BLUE SHIELD | Source: Ambulatory Visit | Attending: Nurse Practitioner | Admitting: Nurse Practitioner

## 2015-09-26 ENCOUNTER — Other Ambulatory Visit (HOSPITAL_COMMUNITY): Payer: Self-pay | Admitting: *Deleted

## 2015-09-26 VITALS — BP 126/82 | HR 113 | Ht 69.0 in | Wt 187.2 lb

## 2015-09-26 DIAGNOSIS — I481 Persistent atrial fibrillation: Secondary | ICD-10-CM | POA: Diagnosis not present

## 2015-09-26 DIAGNOSIS — I4819 Other persistent atrial fibrillation: Secondary | ICD-10-CM

## 2015-09-26 NOTE — Progress Notes (Signed)
Patient ID: Maurice [name], male   DOB: 1951-05-20, 64 y.o.   MRN: [id number]     Primary Care Physician: Wyatt Haste, MD Referring Physician: Dr. Wynell Balloon is a 64 y.o. male with a h/o persistent afib that had ablation x 2, 2010 and 2014. He presented to Dr. Rayann Heman 11/28 in afib, thinking he had been in afib for several days and c/o of  fatigue and feeling nervous. Chadsvasc score was 0 but he was started on xarelto for possible DCCV or chemical cardioversion. He was to take drug for 3 weeks and then start flecainide 100 mg bid and if did not convert, schedule for DCCV. He called back to Dr. Jackalyn Lombard office recently and was still having afib with RVR and feeling poorly. He was instructed to increase cardizem to 240 mg and if he did not feel better with rate control, he could be seen in the afib clinic and arrange for a TEE guided cardioversion.   He did undergo TEE cardioversion and TEE was without clots and he was unsuccessfully cardioverted. He was started on flecainide 100 bid which he has been on x one week  and after conversation with Dr. Rayann Heman, he is to be set up for repeat cardioversion to see if being on flecainide will now hold in rhythm. He has been on xarelto without missed doses and since no clots on TEE 12/7 and no missed does of xarelto, will not require another TEE.  Today, he denies symptoms of palpitations, chest pain, shortness of breath, orthopnea, PND, lower extremity edema, dizziness, presyncope, syncope, or neurologic sequela. The patient is tolerating medications without difficulties and is otherwise without complaint today.   Past Medical History  Diagnosis Date  . Atrial fibrillation (HCC)     paroxysmal s/p PVI by JA on diltiazem and 81mg  ASA  . Diverticulosis     Colonoscopy 07/2013  . Hypercholesterolemia   . Herpes   . History of hepatitis B   . Varicose veins    Past Surgical History  Procedure Laterality Date  . Colonoscopy   2004  . Atrail fibrillation ablation  7/10, 1//17/14    PVI by JA  . Tonsillectomy      when child  . Tee without cardioversion  10/29/2012    Procedure: TRANSESOPHAGEAL ECHOCARDIOGRAM (TEE);  Surgeon: Fay Records, MD;  Location: [facility] ENDOSCOPY;  Service: Cardiovascular;  Laterality: N/A;  pre ablation  . Atrial fibrillation ablation N/A 10/29/2012    Procedure: ATRIAL FIBRILLATION ABLATION;  Surgeon: Thompson Grayer, MD;  Location: [facility] CATH LAB;  Service: Cardiovascular;  Laterality: N/A;  . Cardioversion N/A 09/20/2015    Procedure: CARDIOVERSION;  Surgeon: Thayer Headings, MD;  Location: [facility] ENDOSCOPY;  Service: Cardiovascular;  Laterality: N/A;  . Tee without cardioversion N/A 09/20/2015    Procedure: TRANSESOPHAGEAL ECHOCARDIOGRAM (TEE);  Surgeon: Thayer Headings, MD;  Location: [facility] ENDOSCOPY;  Service: Cardiovascular;  Laterality: N/A;    Current Outpatient Prescriptions  Medication Sig Dispense Refill  . acyclovir (ZOVIRAX) 400 MG tablet Take 1 tablet (400 mg total) by mouth 3 (three) times daily as needed. 30 tablet 5  . atorvastatin (LIPITOR) 40 MG tablet Take 40 mg by mouth daily.    Marland Kitchen diltiazem (CARDIZEM CD) 180 MG 24 hr capsule Take 1 capsule (180 mg total) by mouth daily.    . finasteride (PROSCAR) 5 MG tablet Take 1 tablet by mouth daily.  5  . flecainide (TAMBOCOR) 100 MG tablet Take 0.5 tablets (  50 mg total) by mouth 2 (two) times daily. (Patient taking differently: Take 100 mg by mouth 2 (two) times daily. ) 180 tablet 3  . meclizine (ANTIVERT) 25 MG tablet Take 25 mg by mouth 3 (three) times daily as needed for dizziness.    . multivitamin (THERAGRAN) per tablet Take 1 tablet by mouth daily.      Marland Kitchen omeprazole (PRILOSEC) 40 MG capsule Take 40 mg by mouth daily as needed (acid reflux or heartburn).     . rivaroxaban (XARELTO) 20 MG TABS tablet Take 1 tablet (20 mg total) by mouth daily with supper. 30 tablet 11   No current facility-administered medications for this encounter.     No Known Allergies  Social History   Social History  . Marital Status: Single    Spouse Name: N/A  . Number of Children: N/A  . Years of Education: N/A   Occupational History  . 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 Smoking status: Never Smoker   . Smokeless tobacco: Not on file     Comment: denies   . Alcohol Use: 0.6 oz/week    1 Cans of beer per week  . Drug Use: No  . Sexual Activity: Yes   Other Topics Concern  . Not on file   Social History Narrative    Family History  Problem Relation Age of Onset  . Hypertension Father   . Hypertension Brother   . Hyperlipidemia Brother   . Stroke Mother     ROS- All systems are reviewed and negative except as per the HPI above  Physical Exam: Filed Vitals:   09/26/15 0953  BP: 126/82  Pulse: 113  Height: 5\' 9"  (1.753 m)  Weight: 187 lb 3.2 oz (84.913 kg)    GEN- The patient is well appearing, alert and oriented x 3 today.   Head- normocephalic, atraumatic Eyes-  Sclera clear, conjunctiva pink Ears- hearing intact Oropharynx- clear Neck- supple, no JVP Lymph- no cervical lymphadenopathy Lungs- Clear to ausculation bilaterally, normal work of breathing Heart- Irregular rate and rhythm, no murmurs, rubs or gallops, PMI not laterally displaced GI- soft, NT, ND, + BS Extremities- no clubbing, cyanosis, or edema MS- no significant deformity or atrophy Skin- no rash or lesion Psych- euthymic mood, full affect Neuro- strength and sensation are intact  EKG-afib, v rate 113 bpm. QRS int 96 ms, QTc 499 ms,   Assessment and Plan: 1. Persistent symptomatic afib Chadsvasc score is 0, started on xarleto 15 days ago Unsuccessful DCCV, 12/7, but will reschedule with flecainide now on board x one week Per Dr. Rayann Heman, no TEE is needed with negative TEE 12/6 and no missed xarelto doses He will be seen f/u in one week and will be set up for GXT if flecainide will be continued. Continue  xarelto 20 mg qd  Labs drawn on 12/6 still stand for repeat cardioversion  Butch Penny C. Nahiem Dredge, [facility] [address] [place], [place] [address] [phone]

## 2015-09-26 NOTE — Telephone Encounter (Signed)
Patient notified of DCCV scheduled for 12/15 @ 12pm - to arrive at [employer] entrance. NPO after midnight - labs checked 12/6 WNL -- will make follow up once DCCV completed.

## 2015-09-28 ENCOUNTER — Ambulatory Visit (HOSPITAL_COMMUNITY)
Admission: RE | Admit: 2015-09-28 | Discharge: 2015-09-28 | Disposition: A | Discharge status: Home / Self Care | Payer: BLUE CROSS/BLUE SHIELD | Source: Ambulatory Visit | Attending: Cardiology | Admitting: Cardiology

## 2015-09-28 ENCOUNTER — Ambulatory Visit (HOSPITAL_COMMUNITY): Payer: BLUE CROSS/BLUE SHIELD | Admitting: Anesthesiology

## 2015-09-28 ENCOUNTER — Encounter: Payer: BLUE CROSS/BLUE SHIELD | Admitting: Family Medicine

## 2015-09-28 ENCOUNTER — Encounter (HOSPITAL_COMMUNITY): Payer: Self-pay | Admitting: *Deleted

## 2015-09-28 ENCOUNTER — Encounter (HOSPITAL_COMMUNITY)
Admission: RE | Disposition: A | Discharge status: Home / Self Care | Payer: Self-pay | Source: Ambulatory Visit | Attending: Cardiology

## 2015-09-28 DIAGNOSIS — Z7901 Long term (current) use of anticoagulants: Secondary | ICD-10-CM | POA: Insufficient documentation

## 2015-09-28 DIAGNOSIS — I10 Essential (primary) hypertension: Secondary | ICD-10-CM | POA: Diagnosis not present

## 2015-09-28 DIAGNOSIS — E7800 Pure hypercholesterolemia, unspecified: Secondary | ICD-10-CM | POA: Insufficient documentation

## 2015-09-28 DIAGNOSIS — K219 Gastro-esophageal reflux disease without esophagitis: Secondary | ICD-10-CM | POA: Diagnosis not present

## 2015-09-28 DIAGNOSIS — I480 Paroxysmal atrial fibrillation: Secondary | ICD-10-CM

## 2015-09-28 DIAGNOSIS — Z79899 Other long term (current) drug therapy: Secondary | ICD-10-CM | POA: Diagnosis not present

## 2015-09-28 DIAGNOSIS — I481 Persistent atrial fibrillation: Secondary | ICD-10-CM | POA: Insufficient documentation

## 2015-09-28 DIAGNOSIS — I4891 Unspecified atrial fibrillation: Secondary | ICD-10-CM | POA: Diagnosis present

## 2015-09-28 HISTORY — PX: CARDIOVERSION: SHX1299

## 2015-09-28 SURGERY — CARDIOVERSION
Anesthesia: General

## 2015-09-28 MED ORDER — LIDOCAINE HCL (CARDIAC) 20 MG/ML IV SOLN
INTRAVENOUS | Status: DC | PRN
  Administered 2015-09-28: 60 mg via INTRAVENOUS

## 2015-09-28 MED ORDER — MEPERIDINE HCL 100 MG/ML IJ SOLN: 6.2500 mg | INTRAMUSCULAR | Status: DC | PRN

## 2015-09-28 MED ORDER — PROPOFOL 10 MG/ML IV BOLUS
INTRAVENOUS | Status: DC | PRN
  Administered 2015-09-28: 100 mg via INTRAVENOUS

## 2015-09-28 MED ORDER — FENTANYL CITRATE (PF) 100 MCG/2ML IJ SOLN: 25.0000 ug | INTRAMUSCULAR | Status: DC | PRN

## 2015-09-28 MED ORDER — SODIUM CHLORIDE 0.9 % IV SOLN
INTRAVENOUS | Status: DC
Start: 2015-09-28 — End: 2015-09-28
  Administered 2015-09-28: 500 mL via INTRAVENOUS
  Administered 2015-09-28 (×2): via INTRAVENOUS

## 2015-09-28 MED ORDER — PROMETHAZINE HCL 25 MG/ML IJ SOLN: 6.2500 mg | INTRAMUSCULAR | Status: DC | PRN

## 2015-09-28 NOTE — Transfer of Care (Signed)
Immediate Anesthesia Transfer of Care Note  Patient: Maurice [name]  Procedure(s) Performed: Procedure(s): CARDIOVERSION (N/A)  Patient Location: Endoscopy Unit  Anesthesia Type:MAC  Level of Consciousness: awake, alert , oriented and patient cooperative  Airway & Oxygen Therapy: Patient Spontanous Breathing and Patient connected to nasal cannula oxygen  Post-op Assessment: Report given to RN and Post -op Vital signs reviewed and stable  Post vital signs: Reviewed  Last Vitals:  Filed Vitals:   09/28/15 1123 09/28/15 1214  BP: 135/89   Pulse: 91   Temp: 36.7 C 36.4 C  Resp: 18     Complications: No apparent anesthesia complications

## 2015-09-28 NOTE — Anesthesia Postprocedure Evaluation (Signed)
Anesthesia Post Note  Patient: Maurice [name]  Procedure(s) Performed: Procedure(s) (LRB): CARDIOVERSION (N/A)  Patient location during evaluation: PACU Anesthesia Type: General Level of consciousness: awake and alert Pain management: pain level controlled Vital Signs Assessment: post-procedure vital signs reviewed and stable Respiratory status: spontaneous breathing, nonlabored ventilation and respiratory function stable Cardiovascular status: blood pressure returned to baseline and stable Postop Assessment: no signs of nausea or vomiting Anesthetic complications: no    Last Vitals:  Filed Vitals:   09/28/15 1220 09/28/15 1225  BP: 124/73 124/78  Pulse: 69 66  Temp:    Resp: 12 20    Last Pain: There were no vitals filed for this visit.               Adam Sanjuan,W. EDMOND

## 2015-09-28 NOTE — Anesthesia Procedure Notes (Signed)
Procedure Name: MAC Date/Time: 09/28/2015 12:04 PM Performed by: Jenne Campus Pre-anesthesia Checklist: Patient identified, Emergency Drugs available, Suction available, Patient being monitored and Timeout performed Patient Re-evaluated:Patient Re-evaluated prior to inductionOxygen Delivery Method: Ambu bag

## 2015-09-28 NOTE — Anesthesia Preprocedure Evaluation (Addendum)
Anesthesia Evaluation  Patient identified by MRN, date of birth, ID band Patient awake    Reviewed: Allergy & Precautions, H&P , NPO status , Patient's Chart, lab work & pertinent test results  Airway Mallampati: I  TM Distance: >3 FB Neck ROM: Full    Dental no notable dental hx. (+) Teeth Intact, Dental Advisory Given   Pulmonary neg pulmonary ROS,    Pulmonary exam normal breath sounds clear to auscultation       Cardiovascular hypertension, Pt. on medications + dysrhythmias Atrial Fibrillation  Rhythm:Regular Rate:Normal     Neuro/Psych negative neurological ROS  negative psych ROS   GI/Hepatic Neg liver ROS, GERD  Medicated and Controlled,  Endo/Other  negative endocrine ROS  Renal/GU negative Renal ROS  negative genitourinary   Musculoskeletal   Abdominal   Peds  Hematology negative hematology ROS (+)   Anesthesia Other Findings   Reproductive/Obstetrics negative OB ROS                            Anesthesia Physical Anesthesia Plan  ASA: III  Anesthesia Plan: General   Post-op Pain Management:    Induction: Intravenous  Airway Management Planned: Mask  Additional Equipment:   Intra-op Plan:   Post-operative Plan:   Informed Consent: I have reviewed the patients History and Physical, chart, labs and discussed the procedure including the risks, benefits and alternatives for the proposed anesthesia with the patient or authorized representative who has indicated his/her understanding and acceptance.   Dental advisory given  Plan Discussed with: CRNA and Surgeon  Anesthesia Plan Comments:         Anesthesia Quick Evaluation

## 2015-09-28 NOTE — H&P (Signed)
Maurice [name]  09/26/2015 9:30 AM  ATRIAL FIB OFFICE VISIT  MRN:  [id number]   Description: 64 year old male  Provider: [name], NP  Department: [facility]       Diagnoses     Persistent atrial fibrillation Maurice [name]) - Primary    ICD-9-CM: 427.31 ICD-10-CM: I48.1       Reason for Visit     Atrial Fibrillation    Reason for Visit History        Current Vitals  Most recent update: 09/26/2015 9:53 AM by Orland Penman, CMA    BP Pulse Ht Wt BMI    126/82 mmHg 113 5\' 9"  (1.753 m) 187 lb 3.2 oz (84.913 kg) 27.63 kg/m2      BMI Data     Body Mass Index Body Surface Area    27.63 kg/m 2 2.03 m 2      Progress Notes      [name], NP at 09/26/2015 10:28 AM     Status: Signed       Expand All Collapse All   Patient ID: Maurice [name], male DOB: [birth year], 64 y.o. MRN: [id number]     Primary Care Physician: Wyatt Haste, MD Referring Physician: Dr. Wynell Balloon is a 64 y.o. male with a h/o persistent afib that had ablation x 2, 2010 and 2014. He presented to Dr. Rayann Heman 11/28 in afib, thinking he had been in afib for several days and c/o of fatigue and feeling nervous. Chadsvasc score was 0 but he was started on xarelto for possible DCCV or chemical cardioversion. He was to take drug for 3 weeks and then start flecainide 100 mg bid and if did not convert, schedule for DCCV. He called back to Dr. Jackalyn Lombard office recently and was still having afib with RVR and feeling poorly. He was instructed to increase cardizem to 240 mg and if he did not feel better with rate control, he could be seen in the afib clinic and arrange for a TEE guided cardioversion.   He did undergo TEE cardioversion and TEE was without clots and he was unsuccessfully cardioverted. He was started on flecainide 100 bid which he has been on x one week and after conversation with Dr. Rayann Heman, he is to be set up for repeat  cardioversion to see if being on flecainide will now hold in rhythm. He has been on xarelto without missed doses and since no clots on TEE 12/7 and no missed does of xarelto, will not require another TEE.  Today, he denies symptoms of palpitations, chest pain, shortness of breath, orthopnea, PND, lower extremity edema, dizziness, presyncope, syncope, or neurologic sequela. The patient is tolerating medications without difficulties and is otherwise without complaint today.   Past Medical History  Diagnosis Date  . Atrial fibrillation (HCC)     paroxysmal s/p PVI by JA on diltiazem and 81mg  ASA  . Diverticulosis     Colonoscopy 07/2013  . Hypercholesterolemia   . Herpes   . History of hepatitis B   . Varicose veins    Past Surgical History  Procedure Laterality Date  . Colonoscopy  2004  . Atrail fibrillation ablation  7/10, 1//17/14    PVI by JA  . Tonsillectomy      when child  . Tee without cardioversion  10/29/2012    Procedure: TRANSESOPHAGEAL ECHOCARDIOGRAM (TEE); Surgeon: Fay Records, MD; Location: [name] ENDOSCOPY; Service: Cardiovascular; Laterality: N/A; pre ablation  . Atrial  fibrillation ablation N/A 10/29/2012    Procedure: ATRIAL FIBRILLATION ABLATION; Surgeon: Thompson Grayer, MD; Location: [name] CATH LAB; Service: Cardiovascular; Laterality: N/A;  . Cardioversion N/A 09/20/2015    Procedure: CARDIOVERSION; Surgeon: Thayer Headings, MD; Location: [facility] ENDOSCOPY; Service: Cardiovascular; Laterality: N/A;  . Tee without cardioversion N/A 09/20/2015    Procedure: TRANSESOPHAGEAL ECHOCARDIOGRAM (TEE); Surgeon: Thayer Headings, MD; Location: [facility] ENDOSCOPY; Service: Cardiovascular; Laterality: N/A;    Current Outpatient Prescriptions  Medication Sig Dispense Refill  . acyclovir (ZOVIRAX) 400 MG tablet Take 1 tablet (400 mg total) by mouth 3 (three) times daily as needed. 30 tablet 5    . atorvastatin (LIPITOR) 40 MG tablet Take 40 mg by mouth daily.    Marland Kitchen diltiazem (CARDIZEM CD) 180 MG 24 hr capsule Take 1 capsule (180 mg total) by mouth daily.    . finasteride (PROSCAR) 5 MG tablet Take 1 tablet by mouth daily.  5  . flecainide (TAMBOCOR) 100 MG tablet Take 0.5 tablets (50 mg total) by mouth 2 (two) times daily. (Patient taking differently: Take 100 mg by mouth 2 (two) times daily. ) 180 tablet 3  . meclizine (ANTIVERT) 25 MG tablet Take 25 mg by mouth 3 (three) times daily as needed for dizziness.    . multivitamin (THERAGRAN) per tablet Take 1 tablet by mouth daily.     Marland Kitchen omeprazole (PRILOSEC) 40 MG capsule Take 40 mg by mouth daily as needed (acid reflux or heartburn).     . rivaroxaban (XARELTO) 20 MG TABS tablet Take 1 tablet (20 mg total) by mouth daily with supper. 30 tablet 11   No current facility-administered medications for this encounter.    No Known Allergies  Social History   Social History  . Marital Status: Single    Spouse Name: N/A  . Number of Children: N/A  . Years of Education: N/A   Occupational History  . 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 Smoking status: Never Smoker   . Smokeless tobacco: Not on file     Comment: denies   . Alcohol Use: 0.6 oz/week    1 Cans of beer per week  . Drug Use: No  . Sexual Activity: Yes   Other Topics Concern  . Not on file   Social History Narrative    Family History  Problem Relation Age of Onset  . Hypertension Father   . Hypertension Brother   . Hyperlipidemia Brother   . Stroke Mother     ROS- All systems are reviewed and negative except as per the HPI above  Physical Exam: Filed Vitals:   09/26/15 0953  BP: 126/82  Pulse: 113  Height: 5\' 9"  (1.753 m)  Weight: 187 lb 3.2 oz (84.913 kg)    GEN- The patient is well  appearing, alert and oriented x 3 today.  Head- normocephalic, atraumatic Eyes- Sclera clear, conjunctiva pink Ears- hearing intact Oropharynx- clear Neck- supple, no JVP Lymph- no cervical lymphadenopathy Lungs- Clear to ausculation bilaterally, normal work of breathing Heart- Irregular rate and rhythm, no murmurs, rubs or gallops, PMI not laterally displaced GI- soft, NT, ND, + BS Extremities- no clubbing, cyanosis, or edema MS- no significant deformity or atrophy Skin- no rash or lesion Psych- euthymic mood, full affect Neuro- strength and sensation are intact  EKG-afib, v rate 113 bpm. QRS int 96 ms, QTc 499 ms,   Assessment and Plan: 1. Persistent symptomatic afib Chadsvasc score is 0, started on  xarleto 15 days ago Unsuccessful DCCV, 12/7, but will reschedule with flecainide now on board x one week Per Dr. Rayann Heman, no TEE is needed with negative TEE 12/6 and no missed xarelto doses He will be seen f/u in one week and will be set up for GXT if flecainide will be continued. Continue xarelto 20 mg qd  Labs drawn on 12/6 still stand for repeat cardioversion  Butch Penny C. [facility] [address] [place], [place] [address] [phone]       For [place]; patient has been on xarelto since TEE that showed no LAA thrombus. Kirk Ruths

## 2015-09-28 NOTE — Discharge Instructions (Signed)
Electrical Cardioversion, Care After °Refer to this sheet in the next few weeks. These instructions provide you with information on caring for yourself after your procedure. Your health care provider may also give you more specific instructions. Your treatment has been planned according to current medical practices, but problems sometimes occur. Call your health care provider if you have any problems or questions after your procedure. °WHAT TO EXPECT AFTER THE PROCEDURE °After your procedure, it is typical to have the following sensations: °· Some redness on the skin where the shocks were delivered. If this is tender, a sunburn lotion or hydrocortisone cream may help. °· Possible return of an abnormal heart rhythm within hours or days after the procedure. °HOME CARE INSTRUCTIONS °· Take medicines only as directed by your health care provider. Be sure you understand how and when to take your medicine. °· Learn how to feel your pulse and check it often. °· Limit your activity for 48 hours after the procedure or as directed by your health care provider. °· Avoid or minimize caffeine and other stimulants as directed by your health care provider. °SEEK MEDICAL CARE IF: °· You feel like your heart is beating too fast or your pulse is not regular. °· You have any questions about your medicines. °· You have bleeding that will not stop. °SEEK IMMEDIATE MEDICAL CARE IF: °· You are dizzy or feel faint. °· It is hard to breathe or you feel short of breath. °· There is a change in discomfort in your chest. °· Your speech is slurred or you have trouble moving an arm or leg on one side of your body. °· You get a serious muscle cramp that does not go away. °· Your fingers or toes turn cold or blue. °  °This information is not intended to replace advice given to you by your health care provider. Make sure you discuss any questions you have with your health care provider. °  °Document Released: 07/21/2013 Document Revised: 10/21/2014  Document Reviewed: 07/21/2013 °Elsevier Interactive Patient Education ©2016 [employer] Inc[employer] ° °

## 2015-09-28 NOTE — Procedures (Signed)
Electrical Cardioversion Procedure Note DONAVYN BENNINK [id number] [birth year]  Procedure: Electrical Cardioversion Indications:  Atrial Fibrillation  Procedure Details Consent: Risks of procedure as well as the alternatives and risks of each were explained to the (patient/caregiver).  Consent for procedure obtained. Time Out: Verified patient identification, verified procedure, site/side was marked, verified correct patient position, special equipment/implants available, medications/allergies/relevent history reviewed, required imaging and test results available.  Performed  Patient placed on cardiac monitor, pulse oximetry, supplemental oxygen as necessary.  Sedation given: Patient sedated by anesthesia with lidocaine 60 mg and diprovan 100 mg IV. Pacer pads placed anterior and posterior chest.  Cardioverted 1 time(s).  Cardioverted at [place].  Evaluation Findings: Post procedure EKG shows: NSR Complications: None Patient did tolerate procedure well.   Kirk Ruths 09/28/2015, 11:56 AM

## 2015-09-29 ENCOUNTER — Encounter (HOSPITAL_COMMUNITY): Payer: Self-pay | Admitting: Cardiology

## 2015-10-02 ENCOUNTER — Telehealth (HOSPITAL_COMMUNITY): Payer: Self-pay | Admitting: *Deleted

## 2015-10-02 DIAGNOSIS — I480 Paroxysmal atrial fibrillation: Secondary | ICD-10-CM

## 2015-10-02 MED ORDER — FLECAINIDE ACETATE 100 MG PO TABS: 100.0000 mg | ORAL_TABLET | Freq: Two times a day (BID) | ORAL | Status: DC

## 2015-10-02 MED ORDER — DILTIAZEM HCL ER COATED BEADS 240 MG PO CP24: 240.0000 mg | ORAL_CAPSULE | Freq: Every day | ORAL | Status: DC

## 2015-10-02 NOTE — Telephone Encounter (Signed)
Pt already on flecainide 100mg  BID -- Discussed with Dr. Rayann Heman will increase cardizem back to 240mg   and follow up with Roderic Palau NP on 12/27 as scheduled. Told to be thinking about possible repeat ablation. Patient verbalized understanding.

## 2015-10-02 NOTE — Telephone Encounter (Signed)
Discussed with Dr Rayann Heman.  Patient needs to increase Flecainide to 100 mg bid and keep scheduled follow up

## 2015-10-02 NOTE — Telephone Encounter (Signed)
Patient called in stating he went back into afib on Saturday evening  - was feeling great after cardioversion - but now back to same symptoms of SOB with exertion and increased heart rate.  States his heart rate is running 110s. BP 120/80s. Will forward to Dr. Rayann Heman for suggestions.

## 2015-10-10 ENCOUNTER — Ambulatory Visit (HOSPITAL_COMMUNITY)
Admission: RE | Admit: 2015-10-10 | Discharge: 2015-10-10 | Disposition: A | Discharge status: Home / Self Care | Payer: BLUE CROSS/BLUE SHIELD | Source: Ambulatory Visit | Attending: Nurse Practitioner | Admitting: Nurse Practitioner

## 2015-10-10 VITALS — HR 83 | Ht 69.0 in | Wt 189.0 lb

## 2015-10-10 DIAGNOSIS — I4819 Other persistent atrial fibrillation: Secondary | ICD-10-CM

## 2015-10-10 DIAGNOSIS — I481 Persistent atrial fibrillation: Secondary | ICD-10-CM | POA: Diagnosis not present

## 2015-10-11 ENCOUNTER — Encounter (HOSPITAL_COMMUNITY): Payer: Self-pay | Admitting: Nurse Practitioner

## 2015-10-11 ENCOUNTER — Telehealth (HOSPITAL_COMMUNITY): Payer: Self-pay | Admitting: Nurse Practitioner

## 2015-10-11 NOTE — Progress Notes (Signed)
Patient ID: Maurice [name], male   DOB: 1951-09-25, 64 y.o.   MRN: [id number]     Primary Care Physician: Wyatt Haste, MD Referring Physician: Dr. Wynell Balloon is a 64 y.o. male with a h/o persistent afib that had ablation x 2, 2010 and 2014. He presented to Dr. Rayann Heman 11/28 in afib, thinking he had been in afib for several days and c/o of  fatigue and feeling nervous. Chadsvasc score was 0 but he was started on xarelto for possible DCCV or chemical cardioversion. He was to take drug for 3 weeks and then start flecainide 100 mg bid and if did not convert, schedule for DCCV. He called back to Dr. Jackalyn Lombard office recently and was still having afib with RVR and feeling poorly. He was instructed to increase cardizem to 240 mg and if he did not feel better with rate control, he could be seen in the afib clinic and arrange for a TEE guided cardioversion.   He did undergo TEE cardioversion and TEE was without clots and he was unsuccessfully cardioverted. He was started on flecainide 100 bid and cardioverted again with ERAF.  Dr. Rayann Heman, increased his cardizem after returning to afib and told him to consider repeat ablation. In the afib clinic today, he does not feel well but thinks it is post Christmas fatigue. He has been feeling OK being in rate controlled afib, but does not feel his best,in afib, so he is ready to consider repeat ablation.  Today, he denies symptoms of palpitations, chest pain, shortness of breath, orthopnea, PND, lower extremity edema, dizziness, presyncope, syncope, or neurologic sequela. The patient is tolerating medications without difficulties and is otherwise without complaint today.   Past Medical History  Diagnosis Date  . Atrial fibrillation (HCC)     paroxysmal s/p PVI by JA on diltiazem and 81mg  ASA  . Diverticulosis     Colonoscopy 07/2013  . Hypercholesterolemia   . Herpes   . History of hepatitis B   . Varicose veins    Past Surgical  History  Procedure Laterality Date  . Colonoscopy  2004  . Atrail fibrillation ablation  7/10, 1//17/14    PVI by JA  . Tonsillectomy      when child  . Tee without cardioversion  10/29/2012    Procedure: TRANSESOPHAGEAL ECHOCARDIOGRAM (TEE);  Surgeon: Fay Records, MD;  Location: [facility] ENDOSCOPY;  Service: Cardiovascular;  Laterality: N/A;  pre ablation  . Atrial fibrillation ablation N/A 10/29/2012    Procedure: ATRIAL FIBRILLATION ABLATION;  Surgeon: Thompson Grayer, MD;  Location: [facility] CATH LAB;  Service: Cardiovascular;  Laterality: N/A;  . Cardioversion N/A 09/20/2015    Procedure: CARDIOVERSION;  Surgeon: Thayer Headings, MD;  Location: [facility] ENDOSCOPY;  Service: Cardiovascular;  Laterality: N/A;  . Tee without cardioversion N/A 09/20/2015    Procedure: TRANSESOPHAGEAL ECHOCARDIOGRAM (TEE);  Surgeon: Thayer Headings, MD;  Location: [place];  Service: Cardiovascular;  Laterality: N/A;  . Cardioversion N/A 09/28/2015    Procedure: CARDIOVERSION;  Surgeon: Lelon Perla, MD;  Location: [facility] ENDOSCOPY;  Service: Cardiovascular;  Laterality: N/A;    Current Outpatient Prescriptions  Medication Sig Dispense Refill  . acyclovir (ZOVIRAX) 400 MG tablet Take 1 tablet (400 mg total) by mouth 3 (three) times daily as needed. 30 tablet 5  . atorvastatin (LIPITOR) 40 MG tablet Take 40 mg by mouth daily.    Marland Kitchen diltiazem (CARDIZEM CD) 240 MG 24 hr capsule Take 1 capsule (240 mg total) by  mouth daily. 90 capsule 3  . finasteride (PROSCAR) 5 MG tablet Take 1 tablet by mouth daily.  5  . flecainide (TAMBOCOR) 100 MG tablet Take 1 tablet (100 mg total) by mouth 2 (two) times daily. 180 tablet 3  . meclizine (ANTIVERT) 25 MG tablet Take 25 mg by mouth 3 (three) times daily as needed for dizziness.    . multivitamin (THERAGRAN) per tablet Take 1 tablet by mouth daily.      Marland Kitchen omeprazole (PRILOSEC) 40 MG capsule Take 40 mg by mouth daily as needed (acid reflux or heartburn).     . rivaroxaban (XARELTO) 20 MG TABS  tablet Take 1 tablet (20 mg total) by mouth daily with supper. 30 tablet 11   No current facility-administered medications for this encounter.    No Known Allergies  Social History   Social History  . Marital Status: Single    Spouse Name: N/A  . Number of Children: N/A  . Years of Education: N/A   Occupational History  . 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 Smoking status: Never Smoker   . Smokeless tobacco: Not on file     Comment: denies   . Alcohol Use: 0.6 oz/week    1 Cans of beer per week  . Drug Use: No  . Sexual Activity: Yes   Other Topics Concern  . Not on file   Social History Narrative    Family History  Problem Relation Age of Onset  . Hypertension Father   . Hypertension Brother   . Hyperlipidemia Brother   . Stroke Mother     ROS- All systems are reviewed and negative except as per the HPI above  Physical Exam: Filed Vitals:   10/10/15 1437  Pulse: 83  Height: 5\' 9"  (1.753 m)  Weight: 189 lb (85.73 kg)    GEN- The patient is well appearing, alert and oriented x 3 today.   Head- normocephalic, atraumatic Eyes-  Sclera clear, conjunctiva pink Ears- hearing intact Oropharynx- clear Neck- supple, no JVP Lymph- no cervical lymphadenopathy Lungs- Clear to ausculation bilaterally, normal work of breathing Heart- Irregular rate and rhythm, no murmurs, rubs or gallops, PMI not laterally displaced GI- soft, NT, ND, + BS Extremities- no clubbing, cyanosis, or edema MS- no significant deformity or atrophy Skin- no rash or lesion Psych- euthymic mood, full affect Neuro- strength and sensation are intact  EKG-afib, v rate  83 bpm,. QRS int 88 ms, QTc 465 ms,   Assessment and Plan: 1. Persistent symptomatic afib Unsuccessful DCCV, 12/7,and ERAF with repeat cardioversion, 12/15, after initiation of flecainide Continue xarelto 20 mg qd  Will discuss with Dr. Rayann Heman to see if he wants me to scheduled the ablation,  since pt previously consented by Dr. Rayann Heman for ablation x 2 in the past and has no further questions regarding procedure, or if he will need to be seen in the office by Dr. Rayann Heman first . Will also ask if flecainide is to be continued or discontinued.  Maurice [name], [facility] [address] [place], [place] [address] [phone]

## 2015-10-11 NOTE — Telephone Encounter (Signed)
Let pt know that I spoke to Dr. Rayann Heman and he is ok for the afib clinic to set up afib ablation with cardiac CT not TEE. He can also stop the flecainide since it is not keeping pt in SR. We will call back with the details when scheduled.

## 2015-10-17 ENCOUNTER — Other Ambulatory Visit (HOSPITAL_COMMUNITY): Payer: Self-pay | Admitting: *Deleted

## 2015-10-17 DIAGNOSIS — I4819 Other persistent atrial fibrillation: Secondary | ICD-10-CM

## 2015-10-19 ENCOUNTER — Ambulatory Visit (INDEPENDENT_AMBULATORY_CARE_PROVIDER_SITE_OTHER): Payer: BLUE CROSS/BLUE SHIELD | Admitting: Family Medicine

## 2015-10-19 ENCOUNTER — Encounter: Payer: Self-pay | Admitting: Family Medicine

## 2015-10-19 ENCOUNTER — Encounter: Payer: BLUE CROSS/BLUE SHIELD | Admitting: Family Medicine

## 2015-10-19 VITALS — BP 130/90 | HR 52 | Ht 69.0 in | Wt 186.4 lb

## 2015-10-19 DIAGNOSIS — I10 Essential (primary) hypertension: Secondary | ICD-10-CM

## 2015-10-19 DIAGNOSIS — Z860100 Personal history of colon polyps, unspecified: Secondary | ICD-10-CM

## 2015-10-19 DIAGNOSIS — I4819 Other persistent atrial fibrillation: Secondary | ICD-10-CM

## 2015-10-19 DIAGNOSIS — Z8042 Family history of malignant neoplasm of prostate: Secondary | ICD-10-CM | POA: Diagnosis not present

## 2015-10-19 DIAGNOSIS — I481 Persistent atrial fibrillation: Secondary | ICD-10-CM | POA: Diagnosis not present

## 2015-10-19 DIAGNOSIS — Z1159 Encounter for screening for other viral diseases: Secondary | ICD-10-CM

## 2015-10-19 DIAGNOSIS — L649 Androgenic alopecia, unspecified: Secondary | ICD-10-CM | POA: Diagnosis not present

## 2015-10-19 DIAGNOSIS — K219 Gastro-esophageal reflux disease without esophagitis: Secondary | ICD-10-CM

## 2015-10-19 DIAGNOSIS — Z8619 Personal history of other infectious and parasitic diseases: Secondary | ICD-10-CM

## 2015-10-19 DIAGNOSIS — E785 Hyperlipidemia, unspecified: Secondary | ICD-10-CM

## 2015-10-19 DIAGNOSIS — Z0000 Encounter for general adult medical examination without abnormal findings: Secondary | ICD-10-CM | POA: Diagnosis not present

## 2015-10-19 DIAGNOSIS — K5730 Diverticulosis of large intestine without perforation or abscess without bleeding: Secondary | ICD-10-CM

## 2015-10-19 LAB — CBC WITH DIFFERENTIAL/PLATELET
BASOS ABS: 0 10*3/uL (ref 0.0–0.1)
Basophils Relative: 0 % (ref 0–1)
EOS ABS: 0.1 10*3/uL (ref 0.0–0.7)
Eosinophils Relative: 2 % (ref 0–5)
HCT: 48.4 % (ref 39.0–52.0)
HEMOGLOBIN: 16.7 g/dL (ref 13.0–17.0)
LYMPHS ABS: 2.8 10*3/uL (ref 0.7–4.0)
Lymphocytes Relative: 39 % (ref 12–46)
MCH: 29.8 pg (ref 26.0–34.0)
MCHC: 34.5 g/dL (ref 30.0–36.0)
MCV: 86.3 fL (ref 78.0–100.0)
MPV: 10.3 fL (ref 8.6–12.4)
Monocytes Absolute: 0.7 10*3/uL (ref 0.1–1.0)
Monocytes Relative: 10 % (ref 3–12)
NEUTROS ABS: 3.5 10*3/uL (ref 1.7–7.7)
NEUTROS PCT: 49 % (ref 43–77)
PLATELETS: 234 10*3/uL (ref 150–400)
RBC: 5.61 MIL/uL (ref 4.22–5.81)
RDW: 14.1 % (ref 11.5–15.5)
WBC: 7.2 10*3/uL (ref 4.0–10.5)

## 2015-10-19 MED ORDER — DILTIAZEM HCL ER COATED BEADS 240 MG PO CP24: 240.0000 mg | ORAL_CAPSULE | Freq: Every day | ORAL | Status: DC

## 2015-10-19 MED ORDER — OMEPRAZOLE 40 MG PO CPDR: 40.0000 mg | DELAYED_RELEASE_CAPSULE | Freq: Every day | ORAL | Status: DC | PRN

## 2015-10-19 MED ORDER — ACYCLOVIR 400 MG PO TABS: 400.0000 mg | ORAL_TABLET | Freq: Three times a day (TID) | ORAL | Status: DC | PRN

## 2015-10-19 MED ORDER — ATORVASTATIN CALCIUM 40 MG PO TABS: 40.0000 mg | ORAL_TABLET | Freq: Every day | ORAL | Status: DC

## 2015-10-19 NOTE — Progress Notes (Signed)
Subjective:    Patient ID: Maurice [name], male    DOB: January 02, 1951, 65 y.o.   MRN: [id number]  HPI He is here for complete examination. He continues to have difficulty with atrial fibrillation. He recently has had 2 cardioversions that have not been successful. He is planning on having another ablation. He does occasionally have difficulty with the atrial fib causing him to become fatigued but no chest pain. He has an underlying history of hypertension as well as hyperlipidemia. He continues on medications listed in the chart. They were reviewed with him. He is having no difficulty with his Lipitor. He has had some difficulty with BPH symptoms of nocturia as well as decreased stream. He is presently on finasteride mainly for male pattern baldness. He has a history of GERD and does occasionally use Prilosec. He rarely has an outbreak of herpes labialis and uses Zovirax with good results. He has had a colonoscopy which did show colonic polyps and is scheduled for repeat on a 5 year cycle. There is a family history of prostate cancer. He's had no abdominal pain, nausea or vomiting. He has no allergies. Family and social history as well as health maintenance and immunizations were reviewed. His home life and work are going quite well. He is in a long-term monogamous relationship. Keeps himself physically active.   Review of Systems  All other systems reviewed and are negative.      Objective:   Physical Exam BP 130/90 mmHg  Pulse 52  Ht 5\' 9"  (1.753 m)  Wt 186 lb 6.4 oz (84.55 kg)  BMI 27.51 kg/m2  SpO2 98%  General Appearance:    Alert, cooperative, no distress, appears stated age  Head:    Normocephalic, without obvious abnormality, atraumatic  Eyes:    PERRL, conjunctiva/corneas clear, EOM's intact, fundi    benign  Ears:    Normal TM's and external ear canals  Nose:   Nares normal, mucosa normal, no drainage or sinus   tenderness  Throat:   Lips, mucosa, and tongue normal; teeth  and gums normal  Neck:   Supple, no lymphadenopathy;  thyroid:  no   enlargement/tenderness/nodules; no carotid   bruit or JVD  Back:    Spine nontender, no curvature, ROM normal, no CVA     tenderness  Lungs:     Clear to auscultation bilaterally without wheezes, rales or     ronchi; respirations unlabored  Chest Wall:    No tenderness or deformity   Heart:    Regular rate and rhythm, S1 and S2 normal, no murmur, rub   or gallop     Abdomen:     Soft, non-tender, nondistended, normoactive bowel sounds,    no masses, no hepatosplenomegaly        Extremities:   No clubbing, cyanosis or edema  Pulses:   2+ and symmetric all extremities  Skin:   Skin color, texture, turgor normal, no rashes or lesions  Lymph nodes:   Cervical, supraclavicular, and axillary nodes normal  Neurologic:   CNII-XII intact, normal strength, sensation and gait; reflexes 2+ and symmetric throughout          Psych:   Normal mood, affect, hygiene and grooming.          Assessment & Plan:  Routine general medical examination at a health care facility - Plan: CBC with Differential/Platelet, Comprehensive metabolic panel, PSA, Lipid panel  Persistent atrial fibrillation (HCC) - Plan: CBC with Differential/Platelet, Comprehensive metabolic panel, diltiazem (CARDIZEM  CD) 240 MG 24 hr capsule  Essential hypertension, benign - Plan: Comprehensive metabolic panel, diltiazem (CARDIZEM CD) 240 MG 24 hr capsule  Family history of prostate cancer - Plan: PSA  Hyperlipidemia - Plan: Lipid panel, atorvastatin (LIPITOR) 40 MG tablet  Diverticulosis of large intestine without hemorrhage  History of colonic polyps  Need for hepatitis C screening test - Plan: Hepatitis C antibody  Gastroesophageal reflux disease without esophagitis - Plan: omeprazole (PRILOSEC) 40 MG capsule  History of herpes labialis - Plan: acyclovir (ZOVIRAX) 400 MG tablet  Male pattern baldness  he will continue on his present medications. He is  to have an ablation done in the near future. Also encouraged him to stay as physically active as possible and on the days when he has decreased energy, change his activities to less strenuous ones like walking.

## 2015-10-20 LAB — COMPREHENSIVE METABOLIC PANEL
ALK PHOS: 81 U/L (ref 40–115)
ALT: 35 U/L (ref 9–46)
AST: 20 U/L (ref 10–35)
Albumin: 4.3 g/dL (ref 3.6–5.1)
BUN: 17 mg/dL (ref 7–25)
CO2: 24 mmol/L (ref 20–31)
CREATININE: 0.97 mg/dL (ref 0.70–1.25)
Calcium: 9.5 mg/dL (ref 8.6–10.3)
Chloride: 104 mmol/L (ref 98–110)
Glucose, Bld: 97 mg/dL (ref 65–99)
POTASSIUM: 4.2 mmol/L (ref 3.5–5.3)
SODIUM: 138 mmol/L (ref 135–146)
TOTAL PROTEIN: 6.6 g/dL (ref 6.1–8.1)
Total Bilirubin: 0.8 mg/dL (ref 0.2–1.2)

## 2015-10-20 LAB — LIPID PANEL
Cholesterol: 206 mg/dL — ABNORMAL HIGH (ref 125–200)
HDL: 53 mg/dL (ref >=40)
LDL CALC: 122 mg/dL (ref <130)
Total CHOL/HDL Ratio: 3.9 Ratio (ref <=5.0)
Triglycerides: 155 mg/dL — ABNORMAL HIGH (ref <150)
VLDL: 31 mg/dL — ABNORMAL HIGH (ref <30)

## 2015-10-20 LAB — HEPATITIS C ANTIBODY: HCV AB: NEGATIVE

## 2015-10-20 LAB — PSA: PSA: 0.25 ng/mL (ref <=4.00)

## 2015-10-30 ENCOUNTER — Encounter: Payer: Self-pay | Admitting: Cardiology

## 2015-11-13 ENCOUNTER — Ambulatory Visit: Payer: BLUE CROSS/BLUE SHIELD | Admitting: Internal Medicine

## 2015-11-18 ENCOUNTER — Other Ambulatory Visit: Payer: Self-pay | Admitting: Internal Medicine

## 2015-11-21 ENCOUNTER — Ambulatory Visit (HOSPITAL_COMMUNITY)
Admission: RE | Admit: 2015-11-21 | Discharge: 2015-11-21 | Disposition: A | Discharge status: Home / Self Care | Payer: BLUE CROSS/BLUE SHIELD | Source: Ambulatory Visit | Attending: Internal Medicine | Admitting: Internal Medicine

## 2015-11-21 ENCOUNTER — Encounter (HOSPITAL_COMMUNITY): Payer: Self-pay

## 2015-11-21 ENCOUNTER — Ambulatory Visit (HOSPITAL_BASED_OUTPATIENT_CLINIC_OR_DEPARTMENT_OTHER)
Admission: RE | Admit: 2015-11-21 | Discharge: 2015-11-21 | Disposition: A | Discharge status: Home / Self Care | Payer: BLUE CROSS/BLUE SHIELD | Source: Ambulatory Visit | Attending: Nurse Practitioner | Admitting: Nurse Practitioner

## 2015-11-21 DIAGNOSIS — M12811 Other specific arthropathies, not elsewhere classified, right shoulder: Secondary | ICD-10-CM | POA: Insufficient documentation

## 2015-11-21 DIAGNOSIS — M12812 Other specific arthropathies, not elsewhere classified, left shoulder: Secondary | ICD-10-CM | POA: Insufficient documentation

## 2015-11-21 DIAGNOSIS — I480 Paroxysmal atrial fibrillation: Secondary | ICD-10-CM

## 2015-11-21 DIAGNOSIS — I700 Atherosclerosis of aorta: Secondary | ICD-10-CM | POA: Diagnosis not present

## 2015-11-21 DIAGNOSIS — R079 Chest pain, unspecified: Secondary | ICD-10-CM | POA: Insufficient documentation

## 2015-11-21 DIAGNOSIS — I481 Persistent atrial fibrillation: Secondary | ICD-10-CM | POA: Diagnosis not present

## 2015-11-21 DIAGNOSIS — I4819 Other persistent atrial fibrillation: Secondary | ICD-10-CM

## 2015-11-21 LAB — CBC
HCT: 50.5 % (ref 39.0–52.0)
HEMOGLOBIN: 17.1 g/dL — AB (ref 13.0–17.0)
MCH: 30.2 pg (ref 26.0–34.0)
MCHC: 33.9 g/dL (ref 30.0–36.0)
MCV: 89.1 fL (ref 78.0–100.0)
PLATELETS: 240 10*3/uL (ref 150–400)
RBC: 5.67 MIL/uL (ref 4.22–5.81)
RDW: 12.9 % (ref 11.5–15.5)
WBC: 7.3 10*3/uL (ref 4.0–10.5)

## 2015-11-21 LAB — BASIC METABOLIC PANEL
ANION GAP: 9 (ref 5–15)
BUN: 14 mg/dL (ref 6–20)
CHLORIDE: 104 mmol/L (ref 101–111)
CO2: 25 mmol/L (ref 22–32)
Calcium: 9.2 mg/dL (ref 8.9–10.3)
Creatinine, Ser: 0.9 mg/dL (ref 0.61–1.24)
GFR calc Af Amer: >60 mL/min (ref >60)
GLUCOSE: 95 mg/dL (ref 65–99)
POTASSIUM: 4.3 mmol/L (ref 3.5–5.1)
SODIUM: 138 mmol/L (ref 135–145)

## 2015-11-21 MED ORDER — IOHEXOL 350 MG/ML SOLN
80.0000 mL | Freq: Once | INTRAVENOUS | Status: AC | PRN
  Administered 2015-11-21: 80 mL via INTRAVENOUS

## 2015-11-21 MED ORDER — METOPROLOL TARTRATE 1 MG/ML IV SOLN
INTRAVENOUS | Status: AC
  Filled 2015-11-21: qty 15

## 2015-11-21 MED ORDER — NITROGLYCERIN 0.4 MG SL SUBL
SUBLINGUAL_TABLET | SUBLINGUAL | Status: AC
  Filled 2015-11-21: qty 1

## 2015-11-21 MED ORDER — METOPROLOL TARTRATE 1 MG/ML IV SOLN
5.0000 mg | INTRAVENOUS | Status: DC | PRN
  Administered 2015-11-21 (×3): 5 mg via INTRAVENOUS

## 2015-11-21 MED ORDER — METOPROLOL TARTRATE 1 MG/ML IV SOLN: 5.0000 mg | Freq: Once | INTRAVENOUS | Status: DC

## 2015-11-21 MED ORDER — METOPROLOL TARTRATE 1 MG/ML IV SOLN
INTRAVENOUS | Status: AC
  Filled 2015-11-21: qty 5

## 2015-11-21 MED ORDER — NITROGLYCERIN 0.4 MG SL SUBL
0.4000 mg | SUBLINGUAL_TABLET | SUBLINGUAL | Status: DC | PRN
  Administered 2015-11-21: 0.4 mg via SUBLINGUAL

## 2015-11-28 ENCOUNTER — Encounter (HOSPITAL_COMMUNITY)
Admission: RE | Disposition: A | Discharge status: Home / Self Care | Payer: Self-pay | Source: Ambulatory Visit | Attending: Internal Medicine

## 2015-11-28 ENCOUNTER — Encounter (HOSPITAL_COMMUNITY): Payer: Self-pay | Admitting: Internal Medicine

## 2015-11-28 ENCOUNTER — Ambulatory Visit (HOSPITAL_COMMUNITY): Payer: BLUE CROSS/BLUE SHIELD | Admitting: Anesthesiology

## 2015-11-28 ENCOUNTER — Ambulatory Visit (HOSPITAL_COMMUNITY)
Admission: RE | Admit: 2015-11-28 | Discharge: 2015-11-29 | Disposition: A | Discharge status: Home / Self Care | Payer: BLUE CROSS/BLUE SHIELD | Source: Ambulatory Visit | Attending: Internal Medicine | Admitting: Internal Medicine

## 2015-11-28 DIAGNOSIS — E7800 Pure hypercholesterolemia, unspecified: Secondary | ICD-10-CM | POA: Diagnosis not present

## 2015-11-28 DIAGNOSIS — I4891 Unspecified atrial fibrillation: Secondary | ICD-10-CM | POA: Diagnosis present

## 2015-11-28 DIAGNOSIS — E785 Hyperlipidemia, unspecified: Secondary | ICD-10-CM | POA: Diagnosis not present

## 2015-11-28 DIAGNOSIS — I481 Persistent atrial fibrillation: Secondary | ICD-10-CM | POA: Insufficient documentation

## 2015-11-28 DIAGNOSIS — Z8679 Personal history of other diseases of the circulatory system: Secondary | ICD-10-CM | POA: Diagnosis present

## 2015-11-28 DIAGNOSIS — K219 Gastro-esophageal reflux disease without esophagitis: Secondary | ICD-10-CM

## 2015-11-28 HISTORY — PX: ELECTROPHYSIOLOGIC STUDY: SHX172A

## 2015-11-28 LAB — POCT ACTIVATED CLOTTING TIME
ACTIVATED CLOTTING TIME: 312 s
ACTIVATED CLOTTING TIME: 193 s
Activated Clotting Time: 337 seconds
Activated Clotting Time: 332 seconds
Activated Clotting Time: 302 seconds
Activated Clotting Time: 183 seconds

## 2015-11-28 LAB — MRSA PCR SCREENING: MRSA BY PCR: NEGATIVE

## 2015-11-28 SURGERY — ATRIAL FIBRILLATION ABLATION
Anesthesia: General

## 2015-11-28 MED ORDER — HEPARIN SODIUM (PORCINE) 1000 UNIT/ML IJ SOLN
INTRAMUSCULAR | Status: DC | PRN
  Administered 2015-11-28: 1000 [IU] via INTRAVENOUS
  Administered 2015-11-28: 3000 [IU] via INTRAVENOUS
  Administered 2015-11-28: 1000 [IU] via INTRAVENOUS

## 2015-11-28 MED ORDER — PHENYLEPHRINE HCL 10 MG/ML IJ SOLN
10.0000 mg | INTRAVENOUS | Status: DC | PRN
  Administered 2015-11-28: 15 ug/min via INTRAVENOUS

## 2015-11-28 MED ORDER — ONDANSETRON HCL 4 MG/2ML IJ SOLN: 4.0000 mg | Freq: Four times a day (QID) | INTRAMUSCULAR | Status: DC | PRN

## 2015-11-28 MED ORDER — PROPOFOL 10 MG/ML IV BOLUS
INTRAVENOUS | Status: DC | PRN
  Administered 2015-11-28: 150 mg via INTRAVENOUS

## 2015-11-28 MED ORDER — IOHEXOL 350 MG/ML SOLN
INTRAVENOUS | Status: DC | PRN
  Administered 2015-11-28 (×2): 3 mL via INTRACARDIAC

## 2015-11-28 MED ORDER — ACETAMINOPHEN 325 MG PO TABS: 650.0000 mg | ORAL_TABLET | ORAL | Status: DC | PRN

## 2015-11-28 MED ORDER — BUPIVACAINE HCL (PF) 0.25 % IJ SOLN
INTRAMUSCULAR | Status: DC | PRN
  Administered 2015-11-28: 30 mL

## 2015-11-28 MED ORDER — BUPIVACAINE HCL (PF) 0.25 % IJ SOLN
INTRAMUSCULAR | Status: AC
Start: 2015-11-28 — End: 2015-11-28
  Filled 2015-11-28: qty 30

## 2015-11-28 MED ORDER — FINASTERIDE 5 MG PO TABS
5.0000 mg | ORAL_TABLET | Freq: Every day | ORAL | Status: DC
  Administered 2015-11-29: 5 mg via ORAL
  Filled 2015-11-28: qty 1

## 2015-11-28 MED ORDER — MIDAZOLAM HCL 5 MG/5ML IJ SOLN
INTRAMUSCULAR | Status: DC | PRN
  Administered 2015-11-28: 2 mg via INTRAVENOUS

## 2015-11-28 MED ORDER — SODIUM CHLORIDE 0.9% FLUSH: 3.0000 mL | INTRAVENOUS | Status: DC | PRN

## 2015-11-28 MED ORDER — RIVAROXABAN 20 MG PO TABS
20.0000 mg | ORAL_TABLET | Freq: Every day | ORAL | Status: DC
  Administered 2015-11-28: 20 mg via ORAL
  Filled 2015-11-28: qty 1

## 2015-11-28 MED ORDER — ONDANSETRON HCL 4 MG/2ML IJ SOLN
INTRAMUSCULAR | Status: DC | PRN
  Administered 2015-11-28: 4 mg via INTRAVENOUS

## 2015-11-28 MED ORDER — SODIUM CHLORIDE 0.9% FLUSH
3.0000 mL | Freq: Two times a day (BID) | INTRAVENOUS | Status: DC
  Administered 2015-11-28 – 2015-11-29 (×3): 3 mL via INTRAVENOUS

## 2015-11-28 MED ORDER — HEPARIN SODIUM (PORCINE) 1000 UNIT/ML IJ SOLN
INTRAMUSCULAR | Status: DC | PRN
  Administered 2015-11-28: 1000 [IU] via INTRAVENOUS

## 2015-11-28 MED ORDER — HEPARIN SODIUM (PORCINE) 1000 UNIT/ML IJ SOLN
INTRAMUSCULAR | Status: DC | PRN
  Administered 2015-11-28: 12000 [IU] via INTRAVENOUS
  Administered 2015-11-28: 1000 [IU] via INTRAVENOUS

## 2015-11-28 MED ORDER — SODIUM CHLORIDE 0.9 % IV SOLN
INTRAVENOUS | Status: DC | PRN
  Administered 2015-11-28: 07:00:00 via INTRAVENOUS

## 2015-11-28 MED ORDER — LIDOCAINE HCL (CARDIAC) 20 MG/ML IV SOLN
INTRAVENOUS | Status: DC | PRN
  Administered 2015-11-28: 80 mg via INTRAVENOUS

## 2015-11-28 MED ORDER — DIPHENHYDRAMINE HCL 50 MG/ML IJ SOLN
INTRAMUSCULAR | Status: DC | PRN
  Administered 2015-11-28: 25 mg via INTRAVENOUS

## 2015-11-28 MED ORDER — LACTATED RINGERS IV SOLN
INTRAVENOUS | Status: DC | PRN
  Administered 2015-11-28: 08:00:00 via INTRAVENOUS

## 2015-11-28 MED ORDER — FENTANYL CITRATE (PF) 100 MCG/2ML IJ SOLN
INTRAMUSCULAR | Status: DC | PRN
  Administered 2015-11-28 (×3): 50 ug via INTRAVENOUS

## 2015-11-28 MED ORDER — HEPARIN SODIUM (PORCINE) 1000 UNIT/ML IJ SOLN
INTRAMUSCULAR | Status: AC
Start: 2015-11-28 — End: 2015-11-28
  Filled 2015-11-28: qty 1

## 2015-11-28 MED ORDER — HYDROCODONE-ACETAMINOPHEN 5-325 MG PO TABS
1.0000 | ORAL_TABLET | ORAL | Status: DC | PRN
  Administered 2015-11-29: 2 via ORAL
  Filled 2015-11-28: qty 2

## 2015-11-28 MED ORDER — SODIUM CHLORIDE 0.9 % IV SOLN: 250.0000 mL | INTRAVENOUS | Status: DC | PRN

## 2015-11-28 MED ORDER — PROTAMINE SULFATE 10 MG/ML IV SOLN
INTRAVENOUS | Status: DC | PRN
  Administered 2015-11-28: 10 mg via INTRAVENOUS
  Administered 2015-11-28: 20 mg via INTRAVENOUS

## 2015-11-28 SURGICAL SUPPLY — 20 items
BAG SNAP BAND KOVER 36X36 (MISCELLANEOUS) ×2 IMPLANT
BLANKET WARM UNDERBOD FULL ACC (MISCELLANEOUS) ×2 IMPLANT
CATH DIAG 6FR PIGTAIL (CATHETERS) ×1 IMPLANT
CATH NAVISTAR SMARTTOUCH DF (ABLATOR) ×2 IMPLANT
CATH SOUNDSTAR ECO REPROCESSED (CATHETERS) ×1 IMPLANT
CATH VARIABLE LASSO NAV 2515 (CATHETERS) ×1 IMPLANT
CATH WEBSTER BI DIR CS D-F CRV (CATHETERS) ×2 IMPLANT
COVER SWIFTLINK CONNECTOR (BAG) ×2 IMPLANT
NDL TRANSEP BRK 71CM 407200 (NEEDLE) ×1 IMPLANT
NEEDLE TRANSEP BRK 71CM 407200 (NEEDLE) ×2 IMPLANT
PACK EP LATEX FREE (CUSTOM PROCEDURE TRAY) ×2
PACK EP LF (CUSTOM PROCEDURE TRAY) ×1 IMPLANT
PAD DEFIB LIFELINK (PAD) ×2 IMPLANT
PATCH CARTO3 (PAD) ×2 IMPLANT
SHEATH AVANTI 11F 11CM (SHEATH) ×2 IMPLANT
SHEATH PINNACLE 7F 10CM (SHEATH) ×3 IMPLANT
SHEATH PINNACLE 9F 10CM (SHEATH) ×2 IMPLANT
SHEATH SWARTZ TS SL2 63CM 8.5F (SHEATH) ×2 IMPLANT
SHIELD RADPAD SCOOP 12X17 (MISCELLANEOUS) ×2 IMPLANT
TUBING SMART ABLATE COOLFLOW (TUBING) ×2 IMPLANT

## 2015-11-28 NOTE — Anesthesia Procedure Notes (Signed)
Procedure Name: LMA Insertion Date/Time: 11/28/2015 7:47 AM Performed by: Mariea Clonts Pre-anesthesia Checklist: Emergency Drugs available, Patient identified, Timeout performed, Suction available and Patient being monitored Patient Re-evaluated:Patient Re-evaluated prior to inductionOxygen Delivery Method: Circle system utilized Preoxygenation: Pre-oxygenation with 100% oxygen Intubation Type: IV induction LMA: LMA inserted LMA Size: 4.0 Placement Confirmation: breath sounds checked- equal and bilateral and positive ETCO2 Tube secured with: Tape Dental Injury: Teeth and Oropharynx as per pre-operative assessment

## 2015-11-28 NOTE — Anesthesia Postprocedure Evaluation (Signed)
Anesthesia Post Note  Patient: Maurice [name]  Procedure(s) Performed: Procedure(s) (LRB): Atrial Fibrillation Ablation (N/A)  Patient location during evaluation: Cath Lab Anesthesia Type: General Level of consciousness: awake, awake and alert and oriented Pain management: pain level controlled Respiratory status: spontaneous breathing, nonlabored ventilation and respiratory function stable Cardiovascular status: blood pressure returned to baseline Anesthetic complications: no    Last Vitals:  Filed Vitals:   11/28/15 1400 11/28/15 1430  BP: 127/78 119/77  Pulse: 72 78  Temp:    Resp: 15 17    Last Pain: There were no vitals filed for this visit.               Caya Soberanis COKER

## 2015-11-28 NOTE — Transfer of Care (Signed)
Immediate Anesthesia Transfer of Care Note  Patient: Maurice [name]  Procedure(s) Performed: Procedure(s): Atrial Fibrillation Ablation (N/A)  Patient Location: Cath Lab  Anesthesia Type:General  Level of Consciousness: awake, alert  and oriented  Airway & Oxygen Therapy: Patient Spontanous Breathing and Patient connected to nasal cannula oxygen  Post-op Assessment: Report given to RN and Post -op Vital signs reviewed and stable  Post vital signs: Reviewed and stable  Last Vitals:  Filed Vitals:   11/28/15 0543 11/28/15 1126  BP: 127/88 124/87  Pulse: 82 59  Temp: 36.5 C   Resp: 18 14    Complications: No apparent anesthesia complications

## 2015-11-28 NOTE — Anesthesia Preprocedure Evaluation (Signed)
Anesthesia Evaluation  Patient identified by MRN, date of birth, ID band Patient awake    Reviewed: Allergy & Precautions, NPO status , Patient's Chart, lab work & pertinent test results  Airway Mallampati: II  TM Distance: >3 FB Neck ROM: Full    Dental  (+) Teeth Intact, Dental Advisory Given   Pulmonary    breath sounds clear to auscultation       Cardiovascular  Rhythm:Irregular Rate:Normal     Neuro/Psych    GI/Hepatic   Endo/Other    Renal/GU      Musculoskeletal   Abdominal   Peds  Hematology   Anesthesia Other Findings   Reproductive/Obstetrics                             Anesthesia Physical Anesthesia Plan  ASA: III  Anesthesia Plan: General   Post-op Pain Management:    Induction: Intravenous  Airway Management Planned: LMA  Additional Equipment:   Intra-op Plan:   Post-operative Plan:   Informed Consent: I have reviewed the patients History and Physical, chart, labs and discussed the procedure including the risks, benefits and alternatives for the proposed anesthesia with the patient or authorized representative who has indicated his/her understanding and acceptance.   Dental advisory given  Plan Discussed with: CRNA and Anesthesiologist  Anesthesia Plan Comments:         Anesthesia Quick Evaluation

## 2015-11-28 NOTE — Progress Notes (Signed)
77fr, 56fr and 55fr sheaths aspirated and removed from rfv. Manual pressure applied for 30 minutes. Groin level 0. Tegaderm dressing applied. Bedrest instructions given.   Distal pulses dp and pt bilaterally present, easily palpable.  Bedrest begins at 12:40 for 6 hours.

## 2015-11-28 NOTE — H&P (Signed)
  Maurice [name] is a 65 y.o. male who presents today for repeat afib ablation. He has returned to afib.He appears to be more persistent presently. He reports fatigue in afib. He also feels "nervous".   Today, he denies symptoms of chest pain, shortness of breath, lower extremity edema, dizziness, presyncope, or syncope. The patient is otherwise without complaint today.   Past Medical History  Diagnosis Date  . Atrial fibrillation (HCC)     paroxysmal s/p PVI by JA on diltiazem and 81mg  ASA  . Diverticulosis     Colonoscopy 07/2013  . Hypercholesterolemia   . Herpes   . History of hepatitis B   . Varicose veins    Past Surgical History  Procedure Laterality Date  . Colonoscopy  2004  . Atrail fibrillation ablation  7/10, 1//17/14    PVI by JA  . Tonsillectomy      when child  . Tee without cardioversion  10/29/2012    Procedure: TRANSESOPHAGEAL ECHOCARDIOGRAM (TEE); Surgeon: Fay Records, MD; Location: [facility] ENDOSCOPY; Service: Cardiovascular; Laterality: N/A; pre ablation  . Atrial fibrillation ablation N/A 10/29/2012    Procedure: ATRIAL FIBRILLATION ABLATION; Surgeon: Thompson Grayer, MD; Location: [facility] CATH LAB; Service: Cardiovascular; Laterality: N/A;   home medicines reviewed     Social History   Social History  . Marital Status: Single    Spouse Name: N/A  . Number of Children: N/A  . Years of Education: N/A   Occupational History  . 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 Smoking status: Never Smoker   . Smokeless tobacco: Not on file     Comment: denies   . Alcohol Use: 0.6 oz/week    1 Cans of beer per week  . Drug Use: No  . Sexual Activity: Yes   Other Topics Concern  . Not on file   Social History Narrative     Physical Exam: Filed Vitals:   09/11/15 1045  BP: 138/80  Pulse: 92  Height: 5\' 9"  (1.753 m)  Weight: 186 lb 9.6 oz  (84.641 kg)    GEN- The patient is well appearing, alert and oriented x 3 today.  Head- normocephalic, atraumatic Eyes- Sclera clear, conjunctiva pink Ears- hearing intact Oropharynx- clear Lungs- Clear to ausculation bilaterally, normal work of breathing Heart- irregular rate and rhythm, no murmurs, rubs or gallops, PMI not laterally displaced GI- soft, NT, ND, + BS Extremities- no clubbing, cyanosis, or edema    Assessment and Plan:  1. afib He has returned to Afib.  He is now persistent. chads2vasc score is 0. Cardiac CT reviewed Therapeutic strategies for afib including medicine and ablation were discussed in detail with the patient today. Risk, benefits, and alternatives to EP study and radiofrequency ablation for afib were also discussed in detail today. These risks include but are not limited to stroke, bleeding, vascular damage, tamponade, perforation, damage to the esophagus, lungs, and other structures, pulmonary vein stenosis, worsening renal function, and death. The patient understands these risk and wishes to proceed.  Reports compliance with xarelto without interuption.   Thompson Grayer MD, [facility] 11/28/2015 7:19 AM

## 2015-11-29 DIAGNOSIS — I481 Persistent atrial fibrillation: Secondary | ICD-10-CM

## 2015-11-29 DIAGNOSIS — E7800 Pure hypercholesterolemia, unspecified: Secondary | ICD-10-CM | POA: Diagnosis not present

## 2015-11-29 DIAGNOSIS — E785 Hyperlipidemia, unspecified: Secondary | ICD-10-CM | POA: Diagnosis not present

## 2015-11-29 MED ORDER — OMEPRAZOLE 40 MG PO CPDR: 40.0000 mg | DELAYED_RELEASE_CAPSULE | Freq: Every day | ORAL | Status: DC

## 2015-11-29 NOTE — Discharge Summary (Signed)
ELECTROPHYSIOLOGY PROCEDURE DISCHARGE SUMMARY    Patient ID: Maurice [name],  MRN: [id number], DOB/AGE: 1951/01/10 65 y.o.  Admit date: 11/28/2015 Discharge date: 11/29/2015  Primary Care Physician: Wyatt Haste, MD Electrophysiologist: Thompson Grayer, MD  Primary Discharge Diagnosis:  Persistent AFib  CHADS2Vasc is zero, on Xarelto  Secondary Discharge Diagnosis:  1. HLD   Procedures This Admission:  1.  Electrophysiology study and radiofrequency catheter ablation on 11/28/15 by Dr Thompson Grayer.  This study demonstrated   CONCLUSIONS: 1. Atrial fibrillation upon presentation.  2. Return of conduction within the right superior and inferior pulmonary veins. The left PVs were quiescent from a prior ablation.  3. As prior ablation was antral, I elected to perform WACA ablation of all 4 PVs. The patient underwent successful sequential electrical isolation and anatomical encircling of all four pulmonary veins using radiofrequency current with a circular mapping catheter as a guide. 4. Additional ablation was performed along the carina between the right inferior and superior pulmonary veins.  5. Additional ablation was performed along the posterior wall of the left atrium in order to create a standard box lesion.  6. Atrial fibrillation successfully cardioverted to sinus rhythm. 7. No early apparent complications.   Brief HPI: Maurice [name] is a 65 y.o. male with a history of persistent atrial fibrillation.  They have failed medical therapy with HLD. Risks, benefits, and alternatives to catheter ablation of atrial fibrillation were reviewed with the patient who wished to proceed.     Hospital Course:  The patient was admitted and underwent EPS/RFCA of atrial fibrillation with details as outlined above.  They were monitored on telemetry overnight which demonstrated sinus rhythm with frequent PACs.  Groin was without complication on the day of discharge.  The  patient was examined by Dr. Rayann Heman and considered to be stable for discharge.  Wound care and restrictions were reviewed with the patient.  The patient will be seen back by Roderic Palau, NP in 4 weeks and Dr Rayann Heman in 12 weeks for post ablation follow up.     Physical Exam: Filed Vitals:   11/29/15 0400 11/29/15 0423 11/29/15 0727 11/29/15 0800  BP: 120/63 120/63 90/76 117/76  Pulse: 96 95  90  Temp:  98.8 F (37.1 C)  99 F (37.2 C)  TempSrc:  Oral  Oral  Resp: 23 21 20 17   Height:      Weight:      SpO2: 91% 91%  93%    GEN- The patient is well appearing, alert and oriented x 3 today.   HEENT: normocephalic, atraumatic; sclera clear, conjunctiva pink; hearing intact; oropharynx clear; neck supple  Lungs- Clear to ausculation bilaterally, normal work of breathing.  No wheezes, rales, rhonchi Heart- Regular rate and rhythm, no murmurs, rubs or gallops  GI- soft, non-tender, non-distended, bowel sounds present  Extremities- no clubbing, cyanosis, or edema; DP/PT/radial pulses 2+ bilaterally, groin without hematoma/bruit MS- no significant deformity or atrophy Skin- warm and dry, no rash or lesion Psych- euthymic mood, full affect Neuro- strength and sensation are intact    Labs:   Lab Results  Component Value Date   WBC 7.3 11/21/2015   HGB 17.1* 11/21/2015   HCT 50.5 11/21/2015   MCV 89.1 11/21/2015   PLT 240 11/21/2015   No results for input(s): NA, K, CL, CO2, BUN, CREATININE, CALCIUM, PROT, BILITOT, ALKPHOS, ALT, AST, GLUCOSE in the last 168 hours.  Invalid input(s): LABALBU   Discharge Medications:    Medication List  TAKE these medications        acyclovir 400 MG tablet  Commonly known as:  ZOVIRAX  Take 1 tablet (400 mg total) by mouth 3 (three) times daily as needed.     atorvastatin 40 MG tablet  Commonly known as:  LIPITOR  Take 1 tablet (40 mg total) by mouth daily.     diltiazem 240 MG 24 hr capsule  Commonly known as:  CARDIZEM CD  Take 1  capsule (240 mg total) by mouth daily.     finasteride 5 MG tablet  Commonly known as:  PROSCAR  Take 1 tablet by mouth daily.     flecainide 100 MG tablet  Commonly known as:  TAMBOCOR  Take 1 tablet (100 mg total) by mouth 2 (two) times daily.     meclizine 25 MG tablet  Commonly known as:  ANTIVERT  Take 25 mg by mouth 3 (three) times daily as needed for dizziness.     multivitamin per tablet  Take 1 tablet by mouth daily.     omeprazole 40 MG capsule  Commonly known as:  PRILOSEC  Take 1 capsule (40 mg total) by mouth daily.     rivaroxaban 20 MG Tabs tablet  Commonly known as:  XARELTO  Take 1 tablet (20 mg total) by mouth daily with supper.        Disposition: Home  Follow-up Information    Follow up with CARROLL,DONNA, NP In 4 weeks.   Specialties:  Nurse Practitioner, Cardiology   Contact information:   [place] [address] [phone]       Follow up with Thompson Grayer, MD In 3 months.   Specialty:  Cardiology   Contact information:   [place] [address] [phone]       Duration of Discharge Encounter: Greater than 30 minutes including physician time.  Army Fossa MD  11/29/2015 10:07 AM

## 2015-11-29 NOTE — Discharge Instructions (Signed)
No driving for 4 days. No lifting over 5 lbs for 1 week. No sexual activity for 1 week. You may return to work on 12/05/15. Keep procedure site clean & dry. If you notice increased pain, swelling, bleeding or pus, call/return!  You may shower, but no soaking baths/hot tubs/pools for 1 week.       You have an appointment set up with the [facility].  Multiple studies have shown that being followed by a dedicated atrial fibrillation clinic in addition to the standard care you receive from your other physicians improves health. We believe that enrollment in the atrial fibrillation clinic will allow [place] to better care for you.   The phone number to the [facility] is [phone]. The clinic is staffed Monday through Friday from 8:30am to 5pm.  Parking Directions: The clinic is located in the Heart and Vascular Building connected to [facility]. 1)From [address] turn on to [address] and go to the 3rd entrance  (Heart and Vascular entrance) on the right. 2)Look to the right for Heart &Vascular Parking Garage. 3)A code for the entrance is required please call the clinic to receive this.   4)Take the elevators to the 1st floor. Registration is in the room with the glass walls at the end of the hallway.  If you have any trouble parking or locating the clinic, please dont hesitate to call [phone].

## 2015-11-29 NOTE — Progress Notes (Signed)
Discharged home by wheelchair with volunteer, stable, accompanied by friend, discharge instructions given to pt. Belongings taken home .

## 2015-12-27 ENCOUNTER — Encounter (HOSPITAL_COMMUNITY): Payer: Self-pay | Admitting: Nurse Practitioner

## 2015-12-27 ENCOUNTER — Ambulatory Visit (HOSPITAL_COMMUNITY)
Admission: RE | Admit: 2015-12-27 | Discharge: 2015-12-27 | Disposition: A | Discharge status: Home / Self Care | Payer: BLUE CROSS/BLUE SHIELD | Source: Ambulatory Visit | Attending: Nurse Practitioner | Admitting: Nurse Practitioner

## 2015-12-27 VITALS — BP 140/88 | HR 85 | Ht 69.0 in | Wt 185.8 lb

## 2015-12-27 DIAGNOSIS — I10 Essential (primary) hypertension: Secondary | ICD-10-CM | POA: Diagnosis not present

## 2015-12-27 DIAGNOSIS — Z8619 Personal history of other infectious and parasitic diseases: Secondary | ICD-10-CM | POA: Diagnosis not present

## 2015-12-27 DIAGNOSIS — Z7901 Long term (current) use of anticoagulants: Secondary | ICD-10-CM | POA: Insufficient documentation

## 2015-12-27 DIAGNOSIS — Z8249 Family history of ischemic heart disease and other diseases of the circulatory system: Secondary | ICD-10-CM | POA: Insufficient documentation

## 2015-12-27 DIAGNOSIS — I481 Persistent atrial fibrillation: Secondary | ICD-10-CM

## 2015-12-27 DIAGNOSIS — Z823 Family history of stroke: Secondary | ICD-10-CM | POA: Insufficient documentation

## 2015-12-27 DIAGNOSIS — I4819 Other persistent atrial fibrillation: Secondary | ICD-10-CM

## 2015-12-27 DIAGNOSIS — Z79899 Other long term (current) drug therapy: Secondary | ICD-10-CM | POA: Diagnosis not present

## 2015-12-27 DIAGNOSIS — E7800 Pure hypercholesterolemia, unspecified: Secondary | ICD-10-CM | POA: Diagnosis not present

## 2015-12-27 NOTE — Progress Notes (Signed)
Patient ID: Maurice [name], male   DOB: 22-Oct-1950, 65 y.o.   MRN: [id number]     Primary Care Physician: Wyatt Haste, MD Referring Physician: Dr. Wynell Balloon is a 65 y.o. male with a h/o afib that had his 3rd ablation 11/28/15, for one month f/u. He denies any rt groin pain or swallowing difficulties. He has had several days of afib right after ablation but has been in SR recently. He continues flecainide 100 mg bid and diltiazem. Continues on xarelto.  Today, he denies symptoms of palpitations, chest pain, shortness of breath, orthopnea, PND, lower extremity edema, dizziness, presyncope, syncope, or neurologic sequela. The patient is tolerating medications without difficulties and is otherwise without complaint today.   Past Medical History  Diagnosis Date  . Atrial fibrillation (HCC)     paroxysmal s/p PVI by JA on diltiazem and 81mg  ASA  . Diverticulosis     Colonoscopy 07/2013  . Hypercholesterolemia   . Herpes   . History of hepatitis B   . Varicose veins    Past Surgical History  Procedure Laterality Date  . Colonoscopy  2004  . Atrail fibrillation ablation  7/10, 1//17/14    PVI by JA  . Tonsillectomy      when child  . Tee without cardioversion  10/29/2012    Procedure: TRANSESOPHAGEAL ECHOCARDIOGRAM (TEE);  Surgeon: Fay Records, MD;  Location: [facility] ENDOSCOPY;  Service: Cardiovascular;  Laterality: N/A;  pre ablation  . Atrial fibrillation ablation N/A 10/29/2012    Procedure: ATRIAL FIBRILLATION ABLATION;  Surgeon: Thompson Grayer, MD;  Location: [facility] CATH LAB;  Service: Cardiovascular;  Laterality: N/A;  . Cardioversion N/A 09/20/2015    Procedure: CARDIOVERSION;  Surgeon: Thayer Headings, MD;  Location: [facility] ENDOSCOPY;  Service: Cardiovascular;  Laterality: N/A;  . Tee without cardioversion N/A 09/20/2015    Procedure: TRANSESOPHAGEAL ECHOCARDIOGRAM (TEE);  Surgeon: Thayer Headings, MD;  Location: [place];  Service: Cardiovascular;  Laterality:  N/A;  . Cardioversion N/A 09/28/2015    Procedure: CARDIOVERSION;  Surgeon: Lelon Perla, MD;  Location: [facility] ENDOSCOPY;  Service: Cardiovascular;  Laterality: N/A;  . Electrophysiologic study N/A 11/28/2015    Procedure: Atrial Fibrillation Ablation;  Surgeon: Thompson Grayer, MD;  Location: [place] CV LAB;  Service: Cardiovascular;  Laterality: N/A;    Current Outpatient Prescriptions  Medication Sig Dispense Refill  . acyclovir (ZOVIRAX) 400 MG tablet Take 1 tablet (400 mg total) by mouth 3 (three) times daily as needed. 30 tablet 5  . atorvastatin (LIPITOR) 40 MG tablet Take 1 tablet (40 mg total) by mouth daily. 90 tablet 3  . diltiazem (CARDIZEM CD) 240 MG 24 hr capsule Take 1 capsule (240 mg total) by mouth daily. 90 capsule 3  . finasteride (PROSCAR) 5 MG tablet Take 1 tablet by mouth daily.  5  . flecainide (TAMBOCOR) 100 MG tablet Take 1 tablet (100 mg total) by mouth 2 (two) times daily. 180 tablet 3  . meclizine (ANTIVERT) 25 MG tablet Take 25 mg by mouth 3 (three) times daily as needed for dizziness.    . multivitamin (THERAGRAN) per tablet Take 1 tablet by mouth daily.      Marland Kitchen omeprazole (PRILOSEC) 40 MG capsule Take 1 capsule (40 mg total) by mouth daily. 45 capsule 0  . rivaroxaban (XARELTO) 20 MG TABS tablet Take 1 tablet (20 mg total) by mouth daily with supper. 30 tablet 11   No current facility-administered medications for this encounter.  No Known Allergies  Social History   Social History  . Marital Status: Single    Spouse Name: N/A  . Number of Children: N/A  . Years of Education: N/A   Occupational History  . 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 Smoking status: Never Smoker   . Smokeless tobacco: Not on file     Comment: denies   . Alcohol Use: 0.6 oz/week    1 Cans of beer per week  . Drug Use: No  . Sexual Activity: Yes   Other Topics Concern  . Not on file   Social History Narrative    Family History    Problem Relation Age of Onset  . Hypertension Father   . Hypertension Brother   . Hyperlipidemia Brother   . Stroke Mother     ROS- All systems are reviewed and negative except as per the HPI above  Physical Exam: Filed Vitals:   12/27/15 1125  BP: 140/88  Pulse: 85  Height: 5\' 9"  (1.753 m)  Weight: 185 lb 12.8 oz (84.278 kg)    GEN- The patient is well appearing, alert and oriented x 3 today.   Head- normocephalic, atraumatic Eyes-  Sclera clear, conjunctiva pink Ears- hearing intact Oropharynx- clear Neck- supple, no JVP Lymph- no cervical lymphadenopathy Lungs- Clear to ausculation bilaterally, normal work of breathing Heart- Regular rate and rhythm, no murmurs, rubs or gallops, PMI not laterally displaced GI- soft, NT, ND, + BS Extremities- no clubbing, cyanosis, or edema MS- no significant deformity or atrophy Skin- no rash or lesion Psych- euthymic mood, full affect Neuro- strength and sensation are intact  EKG- SR with pac's, pr int 150 ms, qrs int 76 ms, qtc 436 ms Epic records reviewed  Assessment and Plan: 1. Persistent Afib S/p one month, 3rd ablation Has had some afib right after procedure but appears to be enjoying more SR currently Continue xarelto for chadsvasc score of 1 Cotinue flecainide and diltiazem  2. HTN Stable  F/u with Dr. Rayann Heman 5/22, afib clinic as needed  Butch Penny C. Shonda Mandarino, [facility] [address] [place], [place] [address] [phone]

## 2016-03-04 ENCOUNTER — Ambulatory Visit (INDEPENDENT_AMBULATORY_CARE_PROVIDER_SITE_OTHER): Payer: BLUE CROSS/BLUE SHIELD | Admitting: Internal Medicine

## 2016-03-04 ENCOUNTER — Encounter: Payer: Self-pay | Admitting: Internal Medicine

## 2016-03-04 VITALS — BP 134/82 | HR 62 | Ht 69.0 in | Wt 184.4 lb

## 2016-03-04 DIAGNOSIS — I481 Persistent atrial fibrillation: Secondary | ICD-10-CM | POA: Diagnosis not present

## 2016-03-04 DIAGNOSIS — I4819 Other persistent atrial fibrillation: Secondary | ICD-10-CM

## 2016-03-04 NOTE — Progress Notes (Signed)
PCP: Wyatt Haste, MD  Maurice [name] is a 65 y.o. male who presents today for electrophysiology followup. He is doing great s/p ablation.   Maintaining sinus rhythm.  Denies procedure related complications.  Today, he denies symptoms of chest pain, shortness of breath,  lower extremity edema, dizziness, presyncope, or syncope.  The patient is otherwise without complaint today.   Past Medical History  Diagnosis Date  . Atrial fibrillation (HCC)     paroxysmal s/p PVI by JA  . Diverticulosis     Colonoscopy 07/2013  . Hypercholesterolemia   . Herpes   . History of hepatitis B   . Varicose veins    Past Surgical History  Procedure Laterality Date  . Colonoscopy  2004  . Atrail fibrillation ablation  7/10, 1//17/14    PVI by JA  . Tonsillectomy      when child  . Tee without cardioversion  10/29/2012    Procedure: TRANSESOPHAGEAL ECHOCARDIOGRAM (TEE);  Surgeon: Fay Records, MD;  Location: [facility] ENDOSCOPY;  Service: Cardiovascular;  Laterality: N/A;  pre ablation  . Atrial fibrillation ablation N/A 10/29/2012    Procedure: ATRIAL FIBRILLATION ABLATION;  Surgeon: Thompson Grayer, MD;  Location: [facility] CATH LAB;  Service: Cardiovascular;  Laterality: N/A;  . Cardioversion N/A 09/20/2015    Procedure: CARDIOVERSION;  Surgeon: Thayer Headings, MD;  Location: [facility] ENDOSCOPY;  Service: Cardiovascular;  Laterality: N/A;  . Tee without cardioversion N/A 09/20/2015    Procedure: TRANSESOPHAGEAL ECHOCARDIOGRAM (TEE);  Surgeon: Thayer Headings, MD;  Location: [place];  Service: Cardiovascular;  Laterality: N/A;  . Cardioversion N/A 09/28/2015    Procedure: CARDIOVERSION;  Surgeon: Lelon Perla, MD;  Location: [facility] ENDOSCOPY;  Service: Cardiovascular;  Laterality: N/A;  . Electrophysiologic study N/A 11/28/2015    3rd AF ablation by Dr Rayann Heman    Current Outpatient Prescriptions  Medication Sig Dispense Refill  . acyclovir (ZOVIRAX) 400 MG tablet Take 1 tablet (400 mg total) by mouth 3  (three) times daily as needed. 30 tablet 5  . atorvastatin (LIPITOR) 40 MG tablet Take 1 tablet (40 mg total) by mouth daily. 90 tablet 3  . diltiazem (CARDIZEM CD) 240 MG 24 hr capsule Take 1 capsule (240 mg total) by mouth daily. 90 capsule 3  . finasteride (PROSCAR) 5 MG tablet Take 1 tablet by mouth daily.  5  . flecainide (TAMBOCOR) 100 MG tablet Take 1 tablet (100 mg total) by mouth 2 (two) times daily. 180 tablet 3  . meclizine (ANTIVERT) 25 MG tablet Take 25 mg by mouth 3 (three) times daily as needed for dizziness.    . multivitamin (THERAGRAN) per tablet Take 1 tablet by mouth daily.      Marland Kitchen omeprazole (PRILOSEC) 40 MG capsule Take 40 mg by mouth daily as needed (heartburn or acid reflux).    . rivaroxaban (XARELTO) 20 MG TABS tablet Take 1 tablet (20 mg total) by mouth daily with supper. 30 tablet 11   No current facility-administered medications for this visit.    Physical Exam: Filed Vitals:   03/04/16 1035  BP: 134/82  Pulse: 62  Height: 5\' 9"  (1.753 m)  Weight: 184 lb 6.4 oz (83.643 kg)    GEN- The patient is well appearing, alert and oriented x 3 today.   Head- normocephalic, atraumatic Eyes-  Sclera clear, conjunctiva pink Ears- hearing intact Oropharynx- clear Lungs- Clear to ausculation bilaterally, normal work of breathing Heart- Regular rate and rhythm, no murmurs, rubs or gallops, PMI not laterally displaced  GI- soft, NT, ND, + BS Extremities- no clubbing, cyanosis, or edema  ekg today reveals sinus rhythm  Assessment and Plan:  1. afib Doing well s/p ablation Though his chads2vasc score is 0.  Stop xarelto today We discussed recent HRS late breaking trial which showed benefit to long term AAD therapy even post ablation. He will continue flecainide and diltiazem for now but would like to rediscuss in 3 months  I will see again in 12 weeks  Thompson Grayer MD, [facility] 03/04/2016 11:18 AM

## 2016-03-04 NOTE — Patient Instructions (Signed)
Medication Instructions:  Your physician has recommended you make the following change in your medication:  1) Stop Xarelto   Labwork: None ordered   Testing/Procedures: None ordered   Follow-Up: Your physician recommends that you schedule a follow-up appointment in: 3 months with Dr Allred   Any Other Special Instructions Will Be Listed Below (If Applicable).     If you need a refill on your cardiac medications before your next appointment, please call your pharmacy.   

## 2016-04-15 ENCOUNTER — Encounter: Payer: Self-pay | Admitting: Podiatry

## 2016-04-15 ENCOUNTER — Ambulatory Visit (INDEPENDENT_AMBULATORY_CARE_PROVIDER_SITE_OTHER): Payer: BLUE CROSS/BLUE SHIELD | Admitting: Podiatry

## 2016-04-15 VITALS — BP 124/85 | HR 62 | Resp 16

## 2016-04-15 DIAGNOSIS — L603 Nail dystrophy: Secondary | ICD-10-CM | POA: Diagnosis not present

## 2016-04-15 DIAGNOSIS — B351 Tinea unguium: Secondary | ICD-10-CM

## 2016-04-15 MED ORDER — TERBINAFINE HCL 250 MG PO TABS: ORAL_TABLET | ORAL | Status: DC

## 2016-04-15 NOTE — Progress Notes (Signed)
   Subjective:    Patient ID: Maurice [name], male    DOB: 1951-07-21, 65 y.o.   MRN: [id number]  HPI  Chief Complaint  Patient presents with  . Nail Problem    1st and 3rd toenails right - thick, discolored nails for several years, noticed this starting after taking spin classes, tired multiple OTC and home remedies-no help.       Review of Systems  All other systems reviewed and are negative.      Objective:   Physical Exam        Assessment & Plan:

## 2016-04-15 NOTE — Progress Notes (Signed)
Subjective:     Patient ID: Maurice [name], male   DOB: 30-Jun-1951, 65 y.o.   MRN: [id number]  HPI patient presents concerned about nail disease of his right foot which is been occurring for approximately one year. Has tried over-the-counter medications without relief and it has been an ongoing issue   Review of Systems  All other systems reviewed and are negative.      Objective:   Physical Exam  Constitutional: He is oriented to person, place, and time.  Cardiovascular: Intact distal pulses.   Musculoskeletal: Normal range of motion.  Neurological: He is oriented to person, place, and time.  Skin: Skin is warm.  Nursing note and vitals reviewed.  neurovascular status intact muscle strength adequate range of motion within normal limits with patient noted to have nail disease in the distal medial and lateral with lateral being worse right hallux and thickness of the right third nail. It is localized in nature with no proximal spread and is found to have good digital perfusion and is well oriented 3     Assessment:     Mycotic nail infection of the hallux and third nail right with probable trauma as the beginning cause    Plan:     H&P and condition reviewed with patient. At this point I recommended a combination of oral pulse therapy along with laser and topical formulas 3. I did discuss the risk of oral and patient will check with Dr. but we are not can and do continuous treatment and just 27 pills per month. Today laser was initiated and tolerated well and reappoint one month for continuation

## 2016-05-16 ENCOUNTER — Other Ambulatory Visit: Payer: BLUE CROSS/BLUE SHIELD

## 2016-05-22 ENCOUNTER — Encounter: Payer: Self-pay | Admitting: Internal Medicine

## 2016-05-23 ENCOUNTER — Other Ambulatory Visit: Payer: BLUE CROSS/BLUE SHIELD

## 2016-05-30 ENCOUNTER — Telehealth: Payer: Self-pay | Admitting: Podiatry

## 2016-05-30 NOTE — Telephone Encounter (Signed)
lvm for pt to call to reschedule his laser treatment

## 2016-06-06 ENCOUNTER — Encounter: Payer: Self-pay | Admitting: Internal Medicine

## 2016-06-06 ENCOUNTER — Ambulatory Visit (INDEPENDENT_AMBULATORY_CARE_PROVIDER_SITE_OTHER): Payer: Medicare Other | Admitting: Internal Medicine

## 2016-06-06 VITALS — BP 122/86 | HR 65 | Ht 69.0 in | Wt 178.0 lb

## 2016-06-06 DIAGNOSIS — I481 Persistent atrial fibrillation: Secondary | ICD-10-CM | POA: Diagnosis not present

## 2016-06-06 DIAGNOSIS — E785 Hyperlipidemia, unspecified: Secondary | ICD-10-CM

## 2016-06-06 DIAGNOSIS — I4819 Other persistent atrial fibrillation: Secondary | ICD-10-CM

## 2016-06-06 NOTE — Progress Notes (Signed)
PCP: Wyatt Haste, MD  Maurice [name] is a 65 y.o. male who presents today for routine electrophysiology followup.  Since last being seen in our clinic, the patient reports doing very well.  He remains in sinus and is very pleased.  Today, he denies symptoms of palpitations, chest pain, shortness of breath,  lower extremity edema, dizziness, presyncope, or syncope.  The patient is otherwise without complaint today.   Past Medical History:  Diagnosis Date  . Atrial fibrillation (HCC)    paroxysmal s/p PVI by JA  . Diverticulosis    Colonoscopy 07/2013  . Herpes   . History of hepatitis B   . Hypercholesterolemia   . Varicose veins    Past Surgical History:  Procedure Laterality Date  . atrail fibrillation ablation  7/10, 1//17/14   PVI by JA  . ATRIAL FIBRILLATION ABLATION N/A 10/29/2012   Procedure: ATRIAL FIBRILLATION ABLATION;  Surgeon: Thompson Grayer, MD;  Location: [facility] CATH LAB;  Service: Cardiovascular;  Laterality: N/A;  . CARDIOVERSION N/A 09/20/2015   Procedure: CARDIOVERSION;  Surgeon: Thayer Headings, MD;  Location: [facility] ENDOSCOPY;  Service: Cardiovascular;  Laterality: N/A;  . CARDIOVERSION N/A 09/28/2015   Procedure: CARDIOVERSION;  Surgeon: Lelon Perla, MD;  Location: [place];  Service: Cardiovascular;  Laterality: N/A;  . COLONOSCOPY  2004  . ELECTROPHYSIOLOGIC STUDY N/A 11/28/2015   3rd AF ablation by Dr Rayann Heman  . TEE WITHOUT CARDIOVERSION  10/29/2012   Procedure: TRANSESOPHAGEAL ECHOCARDIOGRAM (TEE);  Surgeon: Fay Records, MD;  Location: [facility] ENDOSCOPY;  Service: Cardiovascular;  Laterality: N/A;  pre ablation  . TEE WITHOUT CARDIOVERSION N/A 09/20/2015   Procedure: TRANSESOPHAGEAL ECHOCARDIOGRAM (TEE);  Surgeon: Thayer Headings, MD;  Location: [facility] ENDOSCOPY;  Service: Cardiovascular;  Laterality: N/A;  . TONSILLECTOMY     when child    ROS- all systems are reviewed and negatives except as per HPI above  Current Outpatient Prescriptions    Medication Sig Dispense Refill  . acyclovir (ZOVIRAX) 400 MG tablet Take 400 mg by mouth 3 (three) times daily as needed. Use as directed    . atorvastatin (LIPITOR) 40 MG tablet Take 1 tablet (40 mg total) by mouth daily. 90 tablet 3  . finasteride (PROSCAR) 5 MG tablet Take 1 tablet by mouth daily.  5  . meclizine (ANTIVERT) 25 MG tablet Take 25 mg by mouth 3 (three) times daily as needed for dizziness.    . multivitamin (THERAGRAN) per tablet Take 1 tablet by mouth daily.      Marland Kitchen omeprazole (PRILOSEC) 40 MG capsule Take 40 mg by mouth daily as needed (heartburn or acid reflux).    . terbinafine (LAMISIL) 250 MG tablet Take one tablet by mouth once daily x 7 days, then repeat every month x 4 months     No current facility-administered medications for this visit.     Physical Exam: Vitals:   06/06/16 0819  BP: 122/86  Pulse: 65  Weight: 178 lb (80.7 kg)  Height: 5\' 9"  (1.753 m)    GEN- The patient is well appearing, alert and oriented x 3 today.   Head- normocephalic, atraumatic Eyes-  Sclera clear, conjunctiva pink Ears- hearing intact Oropharynx- clear Neck- supple, no masses, no bruits Lungs- Clear to ausculation bilaterally, normal work of breathing Heart- Regular rate and rhythm, no murmurs, rubs or gallops, PMI not laterally displaced GI- soft, NT, ND, + BS Extremities- no clubbing, cyanosis, or edema  ekg today reveals sinus rhythm  Assessment and Plan:  1. afib Doing great s/p ablation off AADs chads2vasc is 0.  No anticoagulation  2. HL Primary care following lipids and lfts  Return to see me in 6 months

## 2016-06-06 NOTE — Patient Instructions (Signed)

## 2016-06-11 ENCOUNTER — Ambulatory Visit: Payer: Medicare Other

## 2016-06-11 DIAGNOSIS — L603 Nail dystrophy: Secondary | ICD-10-CM

## 2016-06-11 DIAGNOSIS — B351 Tinea unguium: Secondary | ICD-10-CM

## 2016-06-11 NOTE — Progress Notes (Signed)
Pt presents with mycotic infection of nails  All other systems are negative  Laser therapy administered to affected nails and tolerated well. All safety precautions were in place 

## 2016-06-27 DIAGNOSIS — H5203 Hypermetropia, bilateral: Secondary | ICD-10-CM | POA: Diagnosis not present

## 2016-07-09 ENCOUNTER — Ambulatory Visit (INDEPENDENT_AMBULATORY_CARE_PROVIDER_SITE_OTHER): Payer: Medicare Other

## 2016-07-09 DIAGNOSIS — L603 Nail dystrophy: Secondary | ICD-10-CM

## 2016-07-09 DIAGNOSIS — B351 Tinea unguium: Secondary | ICD-10-CM

## 2016-07-09 NOTE — Progress Notes (Signed)
Pt presents with mycotic infection of nail Rt hallux with 95% clearing  All other systems are negative  Laser therapy administered to affected nails and tolerated well. All safety precautions were in place, pt re-appointed as needed

## 2016-08-06 ENCOUNTER — Other Ambulatory Visit: Payer: Self-pay | Admitting: Family Medicine

## 2016-08-06 DIAGNOSIS — R42 Dizziness and giddiness: Secondary | ICD-10-CM

## 2016-08-06 DIAGNOSIS — H9201 Otalgia, right ear: Secondary | ICD-10-CM

## 2016-08-06 NOTE — Telephone Encounter (Signed)
Is this okay to refill? 

## 2016-08-06 NOTE — Telephone Encounter (Signed)
10/2015 last ov and labs

## 2016-08-27 DIAGNOSIS — L821 Other seborrheic keratosis: Secondary | ICD-10-CM | POA: Diagnosis not present

## 2016-08-27 DIAGNOSIS — D239 Other benign neoplasm of skin, unspecified: Secondary | ICD-10-CM | POA: Diagnosis not present

## 2016-11-01 ENCOUNTER — Encounter: Payer: Self-pay | Admitting: Family Medicine

## 2016-11-01 ENCOUNTER — Ambulatory Visit (INDEPENDENT_AMBULATORY_CARE_PROVIDER_SITE_OTHER): Payer: Medicare Other | Admitting: Family Medicine

## 2016-11-01 VITALS — BP 134/88 | HR 77 | Ht 68.0 in | Wt 174.0 lb

## 2016-11-01 DIAGNOSIS — Z113 Encounter for screening for infections with a predominantly sexual mode of transmission: Secondary | ICD-10-CM | POA: Diagnosis not present

## 2016-11-01 DIAGNOSIS — Z8042 Family history of malignant neoplasm of prostate: Secondary | ICD-10-CM

## 2016-11-01 DIAGNOSIS — E785 Hyperlipidemia, unspecified: Secondary | ICD-10-CM | POA: Diagnosis not present

## 2016-11-01 DIAGNOSIS — Z23 Encounter for immunization: Secondary | ICD-10-CM

## 2016-11-01 DIAGNOSIS — Z8619 Personal history of other infectious and parasitic diseases: Secondary | ICD-10-CM | POA: Diagnosis not present

## 2016-11-01 DIAGNOSIS — L649 Androgenic alopecia, unspecified: Secondary | ICD-10-CM

## 2016-11-01 DIAGNOSIS — Z0000 Encounter for general adult medical examination without abnormal findings: Secondary | ICD-10-CM | POA: Diagnosis not present

## 2016-11-01 DIAGNOSIS — I10 Essential (primary) hypertension: Secondary | ICD-10-CM | POA: Diagnosis not present

## 2016-11-01 DIAGNOSIS — K219 Gastro-esophageal reflux disease without esophagitis: Secondary | ICD-10-CM

## 2016-11-01 DIAGNOSIS — Z125 Encounter for screening for malignant neoplasm of prostate: Secondary | ICD-10-CM | POA: Diagnosis not present

## 2016-11-01 LAB — CBC WITH DIFFERENTIAL/PLATELET
BASOS PCT: 0 %
Basophils Absolute: 0 cells/uL (ref 0–200)
Eosinophils Absolute: 74 cells/uL (ref 15–500)
Eosinophils Relative: 1 %
HEMATOCRIT: 46.9 % (ref 38.5–50.0)
HEMOGLOBIN: 16 g/dL (ref 13.2–17.1)
LYMPHS ABS: 2812 {cells}/uL (ref 850–3900)
LYMPHS PCT: 38 %
MCH: 30.1 pg (ref 27.0–33.0)
MCHC: 34.1 g/dL (ref 32.0–36.0)
MCV: 88.2 fL (ref 80.0–100.0)
MONO ABS: 814 {cells}/uL (ref 200–950)
MPV: 10.2 fL (ref 7.5–12.5)
Monocytes Relative: 11 %
Neutro Abs: 3700 cells/uL (ref 1500–7800)
Neutrophils Relative %: 50 %
Platelets: 219 10*3/uL (ref 140–400)
RBC: 5.32 MIL/uL (ref 4.20–5.80)
RDW: 13.3 % (ref 11.0–15.0)
WBC: 7.4 10*3/uL (ref 4.0–10.5)

## 2016-11-01 LAB — POCT URINALYSIS DIPSTICK
Bilirubin, UA: NEGATIVE
Blood, UA: NEGATIVE
GLUCOSE UA: NEGATIVE
Ketones, UA: NEGATIVE
Leukocytes, UA: NEGATIVE
NITRITE UA: NEGATIVE
PROTEIN UA: NEGATIVE
Spec Grav, UA: 1.02
UROBILINOGEN UA: NEGATIVE
pH, UA: 5.5

## 2016-11-01 LAB — COMPREHENSIVE METABOLIC PANEL
ALK PHOS: 66 U/L (ref 40–115)
ALT: 32 U/L (ref 9–46)
AST: 29 U/L (ref 10–35)
Albumin: 4.2 g/dL (ref 3.6–5.1)
BILIRUBIN TOTAL: 1.1 mg/dL (ref 0.2–1.2)
BUN: 16 mg/dL (ref 7–25)
CALCIUM: 9.6 mg/dL (ref 8.6–10.3)
CO2: 24 mmol/L (ref 20–31)
CREATININE: 0.9 mg/dL (ref 0.70–1.25)
Chloride: 105 mmol/L (ref 98–110)
GLUCOSE: 89 mg/dL (ref 65–99)
Potassium: 4 mmol/L (ref 3.5–5.3)
SODIUM: 139 mmol/L (ref 135–146)
Total Protein: 6.7 g/dL (ref 6.1–8.1)

## 2016-11-01 LAB — LIPID PANEL
Cholesterol: 184 mg/dL (ref <200)
HDL: 61 mg/dL (ref >40)
LDL CALC: 102 mg/dL — AB (ref <100)
Total CHOL/HDL Ratio: 3 Ratio (ref <5.0)
Triglycerides: 103 mg/dL (ref <150)
VLDL: 21 mg/dL (ref <30)

## 2016-11-01 LAB — PSA: PSA: 0.2 ng/mL (ref <=4.0)

## 2016-11-01 MED ORDER — ATORVASTATIN CALCIUM 40 MG PO TABS: 40.0000 mg | ORAL_TABLET | Freq: Every day | ORAL | 3 refills | Status: DC

## 2016-11-01 MED ORDER — FINASTERIDE 5 MG PO TABS: 5.0000 mg | ORAL_TABLET | Freq: Every day | ORAL | 3 refills | Status: DC

## 2016-11-01 MED ORDER — ACYCLOVIR 400 MG PO TABS: 400.0000 mg | ORAL_TABLET | Freq: Three times a day (TID) | ORAL | 1 refills | Status: DC | PRN

## 2016-11-01 NOTE — Progress Notes (Signed)
Subjective:   HPI  Maurice [name] is a 66 y.o. male who presents for a complete physical.  Medical care team includes:  Dr.Allred   Preventative care: Last ophthalmology visit:05/2016 Dr.Lyles Last dental visit: 12/2015 every 9 months Last colonoscopy:05/27/13 Dr.Mann Last prostate exam: ? Last EKG:06/06/16 Last labs: 10/19/15  Prior vaccinations: TD or Tdap:07/30/07 Influenza:07/26/16 Pneumococcal: had one at the hospital not sure which one Shingles/Zostavax: 05/18/13   Advanced directive:Yes  Concerns:None. He states that since he's had his last ablation, his life has been quite good. He has been in a regular rhythm. The only other complaint is appear form is problem that he has been working on doing yoga and other exercises but still intermittently has difficulty with that. He also has a family history of prostate cancer does get regular PSAs. He has had hepatitis C screening. He is in a long-term monogamous relationship but has not had an HIV in quite some time. He continues to do quite nicely on his atorvastatin. He is also taken finasteride mainly for hair growth but is getting some prostate related help as well. He takes Prilosec on an as-needed basis for his reflux. Sexually he is having no difficulty. He does take Zovirax on an as-needed basis. Work and home life are going quite well.  Reviewed their medical, surgical, family, social, medication, and allergy history and updated chart as appropriate.    Review of Systems Constitutional: -fever, -chills, -sweats, -unexpected weight change, -decreased appetite, -fatigue    Objective:   Physical Exam  General appearance: alert, no distress, WD/WN,  Skin:Normal HEENT: normocephalic, conjunctiva/corneas normal, sclerae anicteric, PERRLA, EOMi, nares patent, no discharge or erythema, pharynx normal Oral cavity: MMM, tongue normal, teeth normal Neck: supple, no lymphadenopathy, no thyromegaly, no masses, normal ROM Chest: non  tender, normal shape and expansion Heart: RRR, normal S1, S2, no murmurs Lungs: CTA bilaterally, no wheezes, rhonchi, or rales Abdomen: +bs, soft, non tender, non distended, no masses, no hepatomegaly, no splenomegaly, no bruits Musculoskeletal: upper extremities non tender, no obvious deformity, normal ROM throughout, lower extremities non tender, no obvious deformity, normal ROM throughout Extremities: no edema, no cyanosis, no clubbing Pulses: 2+ symmetric, upper and lower extremities, normal cap refill Neurological: alert, oriented x 3, CN2-12 intact, strength normal upper extremities and lower extremities, sensation normal throughout, DTRs 2+ throughout, no cerebellar signs, gait normal Psychiatric: normal affect, behavior normal, pleasant    Assessment and Plan :   Routine general medical examination at a health care facility - Plan: POCT Urinalysis Dipstick, CBC with Differential/Platelet, Comprehensive metabolic panel, Lipid panel  Essential hypertension, benign - Plan: CBC with Differential/Platelet, Comprehensive metabolic panel  Family history of prostate cancer - Plan: PSA  Hyperlipidemia, unspecified hyperlipidemia type - Plan: Lipid panel  Male pattern baldness  Gastroesophageal reflux disease without esophagitis  History of herpes labialis  Screening for STD (sexually transmitted disease) - Plan: HIV antibody  Screening for prostate cancer - Plan: PSA  Need for prophylactic vaccination against Streptococcus pneumoniae (pneumococcus) - Plan: Pneumococcal conjugate vaccine 13-valent Overall things are going quite well for him. Physical exam - discussed healthy lifestyle, diet, exercise, preventative care, vaccinations, and addressed their concerns.    Follow-up as needed

## 2016-11-02 LAB — HIV ANTIBODY (ROUTINE TESTING W REFLEX): HIV: NONREACTIVE

## 2016-11-04 NOTE — Addendum Note (Signed)
Addended by: [name] on: 11/04/2016 05:39 PM   Modules accepted: Level of Service

## 2016-11-25 ENCOUNTER — Encounter (HOSPITAL_COMMUNITY): Payer: Self-pay | Admitting: Nurse Practitioner

## 2016-11-25 ENCOUNTER — Ambulatory Visit (HOSPITAL_COMMUNITY)
Admission: RE | Admit: 2016-11-25 | Discharge: 2016-11-25 | Disposition: A | Discharge status: Home / Self Care | Payer: Medicare Other | Source: Ambulatory Visit | Attending: Nurse Practitioner | Admitting: Nurse Practitioner

## 2016-11-25 VITALS — BP 126/80 | HR 121 | Ht 68.0 in | Wt 178.0 lb

## 2016-11-25 DIAGNOSIS — Z7901 Long term (current) use of anticoagulants: Secondary | ICD-10-CM | POA: Diagnosis not present

## 2016-11-25 DIAGNOSIS — I480 Paroxysmal atrial fibrillation: Secondary | ICD-10-CM | POA: Diagnosis not present

## 2016-11-25 DIAGNOSIS — E7800 Pure hypercholesterolemia, unspecified: Secondary | ICD-10-CM | POA: Insufficient documentation

## 2016-11-25 DIAGNOSIS — I4891 Unspecified atrial fibrillation: Secondary | ICD-10-CM | POA: Insufficient documentation

## 2016-11-25 DIAGNOSIS — Z7982 Long term (current) use of aspirin: Secondary | ICD-10-CM | POA: Insufficient documentation

## 2016-11-25 MED ORDER — RIVAROXABAN 20 MG PO TABS: 20.0000 mg | ORAL_TABLET | Freq: Every day | ORAL | Status: DC

## 2016-11-25 MED ORDER — DILTIAZEM HCL ER COATED BEADS 120 MG PO CP24: 120.0000 mg | ORAL_CAPSULE | Freq: Every day | ORAL | 6 refills | Status: DC

## 2016-11-25 NOTE — Progress Notes (Signed)
Primary Care Physician: Wyatt Haste, MD Referring Physician: Dr. Wynell Balloon is a 66 y.o. male with a h/o afib, s/p ablation 11/28/15. He is currently off flecainide and diltiazem. He developed afib Saturday am and asked to be seen today. He has not taken any meds at home. He does not know of any triggers.   Today, he denies symptoms of palpitations, chest pain, shortness of breath, orthopnea, PND, lower extremity edema, dizziness, presyncope, syncope, or neurologic sequela. The patient is tolerating medications without difficulties and is otherwise without complaint today.   Past Medical History:  Diagnosis Date  . Atrial fibrillation (HCC)    paroxysmal s/p PVI by JA  . Diverticulosis    Colonoscopy 07/2013  . Herpes   . History of hepatitis B   . Hypercholesterolemia   . Varicose veins    Past Surgical History:  Procedure Laterality Date  . atrail fibrillation ablation  7/10, 1//17/14   PVI by JA  . ATRIAL FIBRILLATION ABLATION N/A 10/29/2012   Procedure: ATRIAL FIBRILLATION ABLATION;  Surgeon: Thompson Grayer, MD;  Location: [facility] CATH LAB;  Service: Cardiovascular;  Laterality: N/A;  . CARDIOVERSION N/A 09/20/2015   Procedure: CARDIOVERSION;  Surgeon: Thayer Headings, MD;  Location: [facility] ENDOSCOPY;  Service: Cardiovascular;  Laterality: N/A;  . CARDIOVERSION N/A 09/28/2015   Procedure: CARDIOVERSION;  Surgeon: Lelon Perla, MD;  Location: [place];  Service: Cardiovascular;  Laterality: N/A;  . COLONOSCOPY  2004  . ELECTROPHYSIOLOGIC STUDY N/A 11/28/2015   3rd AF ablation by Dr Rayann Heman  . TEE WITHOUT CARDIOVERSION  10/29/2012   Procedure: TRANSESOPHAGEAL ECHOCARDIOGRAM (TEE);  Surgeon: Fay Records, MD;  Location[facility] ENDOSCOPY;  Service: Cardiovascular;  Laterality: N/A;  pre ablation  . TEE WITHOUT CARDIOVERSION N/A 09/20/2015   Procedure: TRANSESOPHAGEAL ECHOCARDIOGRAM (TEE);  Surgeon: Thayer Headings, MD;  Location: [place];  Service:  Cardiovascular;  Laterality: N/A;  . TONSILLECTOMY     when child    Current Outpatient Prescriptions  Medication Sig Dispense Refill  . acyclovir (ZOVIRAX) 400 MG tablet Take 1 tablet (400 mg total) by mouth 3 (three) times daily as needed. Use as directed 30 tablet 1  . aspirin EC 81 MG tablet Take 81 mg by mouth daily.    Marland Kitchen atorvastatin (LIPITOR) 40 MG tablet Take 1 tablet (40 mg total) by mouth daily. 90 tablet 3  . finasteride (PROSCAR) 5 MG tablet Take 1 tablet (5 mg total) by mouth daily. 90 tablet 3  . meclizine (ANTIVERT) 25 MG tablet TAKE 1 TABLET (25 MG TOTAL) BY MOUTH DAILY. (Patient taking differently: Take 25 mg by mouth daily. PRN) 15 tablet 1  . omeprazole (PRILOSEC) 40 MG capsule Take 40 mg by mouth daily as needed (heartburn or acid reflux).    Marland Kitchen diltiazem (CARDIZEM CD) 120 MG 24 hr capsule Take 1 capsule (120 mg total) by mouth daily. 30 capsule 6  . multivitamin (THERAGRAN) per tablet Take 1 tablet by mouth daily.      . rivaroxaban (XARELTO) 20 MG TABS tablet Take 1 tablet (20 mg total) by mouth daily with supper. 30 tablet    No current facility-administered medications for this encounter.     No Known Allergies  Social History   Social History  . Marital status: Single    Spouse name: N/A  . Number of children: N/A  . Years of education: N/A   Occupational History  . 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 Smoking status: Never Smoker  . Smokeless tobacco: Never Used     Comment: denies   . Alcohol use 0.6 oz/week    1 Cans of beer per week  . Drug use: No  . Sexual activity: Yes   Other Topics Concern  . Not on file   Social History Narrative  . No narrative on file    Family History  Problem Relation Age of Onset  . Hypertension Father   . Hypertension Brother   . Hyperlipidemia Brother   . Stroke Mother     ROS- All systems are reviewed and negative except as per the HPI above  Physical  Exam: Vitals:   11/25/16 1403  BP: 126/80  Pulse: (!) 121  Weight: 178 lb (80.7 kg)  Height: 5\' 8"  (1.727 m)   Wt Readings from Last 3 Encounters:  11/25/16 178 lb (80.7 kg)  11/01/16 174 lb (78.9 kg)  06/06/16 178 lb (80.7 kg)    Labs: Lab Results  Component Value Date   NA 139 11/01/2016   K 4.0 11/01/2016   CL 105 11/01/2016   CO2 24 11/01/2016   GLUCOSE 89 11/01/2016   BUN 16 11/01/2016   CREATININE 0.90 11/01/2016   CALCIUM 9.6 11/01/2016   MG 2.5 10/10/2008   Lab Results  Component Value Date   INR 2.3 08/09/2009   Lab Results  Component Value Date   CHOL 184 11/01/2016   HDL 61 11/01/2016   LDLCALC 102 (H) 11/01/2016   TRIG 103 11/01/2016     GEN- The patient is well appearing, alert and oriented x 3 today.   Head- normocephalic, atraumatic Eyes-  Sclera clear, conjunctiva pink Ears- hearing intact Oropharynx- clear Neck- supple, no JVP Lymph- no cervical lymphadenopathy Lungs- Clear to ausculation bilaterally, normal work of breathing Heart- irregular rate and rhythm, no murmurs, rubs or gallops, PMI not laterally displaced GI- soft, NT, ND, + BS Extremities- no clubbing, cyanosis, or edema MS- no significant deformity or atrophy Skin- no rash or lesion Psych- euthymic mood, full affect Neuro- strength and sensation are intact  EKG- afib at 121 bpm, qrs int 74 ms, qtc 408 ms Epic records reviewed   Assessment and Plan:  1. Paroxysmal afib It has now been 48 hours since afib started and will not recommend chemical cardioversion with flecainide I will start back on xarelto 20 mg qd and cardizem 120 mg a day  Return on Wednesday for repeat EKG  Butch Penny C. Nyomi Howser, [facility] [address] [place], [place] [address] [phone]

## 2016-11-25 NOTE — Patient Instructions (Signed)
Your physician has recommended you make the following change in your medication:  1)Stop Aspirin 2)start xarelto 20mg  once a day with supper 3)Start Cardizem 120mg  once a day

## 2016-11-26 ENCOUNTER — Telehealth (HOSPITAL_COMMUNITY): Payer: Self-pay | Admitting: *Deleted

## 2016-11-26 NOTE — Telephone Encounter (Signed)
Pt called in stating he returned to NS[facility] this morning HR in the 70s BP 120/72. Feeling much improved.  Discussed with Roderic Palau NP will continue cardizem daily and xarelto until follow up appointment with Dr. Rayann Heman next week. Pt verbalized understanding.

## 2016-11-27 ENCOUNTER — Inpatient Hospital Stay (HOSPITAL_COMMUNITY): Admission: RE | Admit: 2016-11-27 | Payer: Medicare Other | Source: Ambulatory Visit | Admitting: Nurse Practitioner

## 2016-12-09 ENCOUNTER — Other Ambulatory Visit: Payer: Self-pay

## 2016-12-09 ENCOUNTER — Encounter: Payer: Self-pay | Admitting: Internal Medicine

## 2016-12-09 ENCOUNTER — Ambulatory Visit (INDEPENDENT_AMBULATORY_CARE_PROVIDER_SITE_OTHER): Payer: Medicare Other | Admitting: Internal Medicine

## 2016-12-09 VITALS — BP 120/78 | HR 64 | Ht 69.0 in | Wt 179.6 lb

## 2016-12-09 DIAGNOSIS — I480 Paroxysmal atrial fibrillation: Secondary | ICD-10-CM | POA: Diagnosis not present

## 2016-12-09 DIAGNOSIS — E7800 Pure hypercholesterolemia, unspecified: Secondary | ICD-10-CM

## 2016-12-09 MED ORDER — RIVAROXABAN 20 MG PO TABS: 20.0000 mg | ORAL_TABLET | Freq: Every day | ORAL | 3 refills | Status: DC

## 2016-12-09 NOTE — Patient Instructions (Addendum)
Medication Instructions:  Your physician recommends that you continue on your current medications as directed. Please refer to the Current Medication list given to you today.   Labwork: None ordered   Testing/Procedures: None ordered   Follow-Up: Your physician wants you to follow-up in: 6 weeks with Roderic Palau, NP at the [facility] and in 3 months with Dr. Rayann Heman.   Any Other Special Instructions Will Be Listed Below (If Applicable).     If you need a refill on your cardiac medications before your next appointment, please call your pharmacy.

## 2016-12-09 NOTE — Progress Notes (Signed)
PCP: Wyatt Haste, MD  Maurice [name] is a 66 y.o. male who presents today for routine electrophysiology followup.  Since last being seen in our clinic, the patient reports doing very well.  He had no afib for a year post recent ablation.  Unfortunately, over the past few weeks, he has had several paroxysms.  He is unaware of triggers. Today, he denies symptoms of  chest pain, shortness of breath,  lower extremity edema, dizziness, presyncope, or syncope.  The patient is otherwise without complaint today.   Past Medical History:  Diagnosis Date  . Atrial fibrillation (HCC)    paroxysmal s/p PVI by JA  . Diverticulosis    Colonoscopy 07/2013  . Herpes   . History of hepatitis B   . Hypercholesterolemia   . Varicose veins    Past Surgical History:  Procedure Laterality Date  . atrail fibrillation ablation  7/10, 1//17/14   PVI by JA  . ATRIAL FIBRILLATION ABLATION N/A 10/29/2012   Procedure: ATRIAL FIBRILLATION ABLATION;  Surgeon: Thompson Grayer, MD;  Location: [facility] CATH LAB;  Service: Cardiovascular;  Laterality: N/A;  . CARDIOVERSION N/A 09/20/2015   Procedure: CARDIOVERSION;  Surgeon: Thayer Headings, MD;  Location: [facility] ENDOSCOPY;  Service: Cardiovascular;  Laterality: N/A;  . CARDIOVERSION N/A 09/28/2015   Procedure: CARDIOVERSION;  Surgeon: Lelon Perla, MD;  Location: [place];  Service: Cardiovascular;  Laterality: N/A;  . COLONOSCOPY  2004  . ELECTROPHYSIOLOGIC STUDY N/A 11/28/2015   3rd AF ablation by Dr Rayann Heman  . TEE WITHOUT CARDIOVERSION  10/29/2012   Procedure: TRANSESOPHAGEAL ECHOCARDIOGRAM (TEE);  Surgeon: Fay Records, MD;  Location[facility] ENDOSCOPY;  Service: Cardiovascular;  Laterality: N/A;  pre ablation  . TEE WITHOUT CARDIOVERSION N/A 09/20/2015   Procedure: TRANSESOPHAGEAL ECHOCARDIOGRAM (TEE);  Surgeon: Thayer Headings, MD;  Location: [facility] ENDOSCOPY;  Service: Cardiovascular;  Laterality: N/A;  . TONSILLECTOMY     when child    ROS- all systems are  reviewed and negatives except as per HPI above  Current Outpatient Prescriptions  Medication Sig Dispense Refill  . acyclovir (ZOVIRAX) 400 MG tablet Take 1 tablet (400 mg total) by mouth 3 (three) times daily as needed. Use as directed 30 tablet 1  . atorvastatin (LIPITOR) 40 MG tablet Take 1 tablet (40 mg total) by mouth daily. 90 tablet 3  . diltiazem (CARDIZEM CD) 120 MG 24 hr capsule Take 1 capsule (120 mg total) by mouth daily. 30 capsule 6  . finasteride (PROSCAR) 5 MG tablet Take 1 tablet (5 mg total) by mouth daily. 90 tablet 3  . multivitamin (THERAGRAN) per tablet Take 1 tablet by mouth daily.      Marland Kitchen omeprazole (PRILOSEC) 40 MG capsule Take 40 mg by mouth daily as needed (heartburn or acid reflux).    . rivaroxaban (XARELTO) 20 MG TABS tablet Take 1 tablet (20 mg total) by mouth daily with supper. 30 tablet   . meclizine (ANTIVERT) 25 MG tablet Take 25 mg by mouth as directed. Take as needed for lightheadedness/vertigo     No current facility-administered medications for this visit.     Physical Exam: Vitals:   12/09/16 0924  BP: 120/78  Pulse: 64  Weight: 179 lb 9.6 oz (81.5 kg)  Height: 5\' 9"  (1.753 m)    GEN- The patient is well appearing, alert and oriented x 3 today.   Head- normocephalic, atraumatic Eyes-  Sclera clear, conjunctiva pink Ears- hearing intact Oropharynx- clear Neck- supple, no masses, no bruits Lungs-  Clear to ausculation bilaterally, normal work of breathing Heart- Regular rate and rhythm, no murmurs, rubs or gallops, PMI not laterally displaced GI- soft, NT, ND, + BS Extremities- no clubbing, cyanosis, or edema  ekg today reveals sinus rhythm 64 bpm, PR 174 msec, QRS 83 msec, QTc 412 msec, poor r wave progression  Assessment and Plan:  1. afib He has had a few recent episodes of afib Today, we have decided to continue a conservative approach.  He will keep a long of his AF burden.  If episodes increase, he will start flecainide 50mg  BID (he  will call AF clinic first).  Other options in the future include multaq, sotalol, tikosyn, and repeat ablation chads2vasc is 0.  Keep on xarelto until we better determine if his AF is going to progress  2. HL Primary care following lipids and lfts  Follow-up in AF clinic in 6 weeks Return to see me in 3 months  Thompson Grayer MD, [facility] 12/09/2016 10:08 AM

## 2017-01-21 ENCOUNTER — Encounter (HOSPITAL_COMMUNITY): Payer: Self-pay | Admitting: Nurse Practitioner

## 2017-01-21 ENCOUNTER — Ambulatory Visit (HOSPITAL_COMMUNITY)
Admission: RE | Admit: 2017-01-21 | Discharge: 2017-01-21 | Disposition: A | Discharge status: Home / Self Care | Payer: Medicare Other | Source: Ambulatory Visit | Attending: Nurse Practitioner | Admitting: Nurse Practitioner

## 2017-01-21 VITALS — BP 136/80 | HR 63 | Ht 69.0 in | Wt 182.2 lb

## 2017-01-21 DIAGNOSIS — Z7901 Long term (current) use of anticoagulants: Secondary | ICD-10-CM | POA: Insufficient documentation

## 2017-01-21 DIAGNOSIS — E7800 Pure hypercholesterolemia, unspecified: Secondary | ICD-10-CM | POA: Insufficient documentation

## 2017-01-21 DIAGNOSIS — Z9889 Other specified postprocedural states: Secondary | ICD-10-CM | POA: Diagnosis not present

## 2017-01-21 DIAGNOSIS — I480 Paroxysmal atrial fibrillation: Secondary | ICD-10-CM

## 2017-01-21 DIAGNOSIS — I4891 Unspecified atrial fibrillation: Secondary | ICD-10-CM | POA: Diagnosis present

## 2017-01-21 NOTE — Progress Notes (Signed)
Primary Care Physician: Wyatt Haste, MD Referring Physician: Dr. Wynell Balloon is a 66 y.o. male with a h/o afib, s/p ablation 11/28/15. He is currently off flecainide and diltiazem. He developed afib 11/26/16 and asked to be seen.. He has not taken any meds at home. He does not know of any triggers. He was started on cardizem daily and started back on xarelto for chadsvasc score of 1(age).  He self converted that day after starting meds. He then had one other short lived episode and afib has been quiet. He would like to try to stop cardizem and xarelto.   Today, he denies symptoms of palpitations, chest pain, shortness of breath, orthopnea, PND, lower extremity edema, dizziness, presyncope, syncope, or neurologic sequela. The patient is tolerating medications without difficulties and is otherwise without complaint today.   Past Medical History:  Diagnosis Date  . Atrial fibrillation (HCC)    paroxysmal s/p PVI by JA  . Diverticulosis    Colonoscopy 07/2013  . Herpes   . History of hepatitis B   . Hypercholesterolemia   . Varicose veins    Past Surgical History:  Procedure Laterality Date  . atrail fibrillation ablation  7/10, 1//17/14   PVI by JA  . ATRIAL FIBRILLATION ABLATION N/A 10/29/2012   Procedure: ATRIAL FIBRILLATION ABLATION;  Surgeon: Thompson Grayer, MD;  Location: [facility] CATH LAB;  Service: Cardiovascular;  Laterality: N/A;  . CARDIOVERSION N/A 09/20/2015   Procedure: CARDIOVERSION;  Surgeon: Thayer Headings, MD;  Location: [facility] ENDOSCOPY;  Service: Cardiovascular;  Laterality: N/A;  . CARDIOVERSION N/A 09/28/2015   Procedure: CARDIOVERSION;  Surgeon: Lelon Perla, MD;  Location: [place];  Service: Cardiovascular;  Laterality: N/A;  . COLONOSCOPY  2004  . ELECTROPHYSIOLOGIC STUDY N/A 11/28/2015   3rd AF ablation by Dr Rayann Heman  . TEE WITHOUT CARDIOVERSION  10/29/2012   Procedure: TRANSESOPHAGEAL ECHOCARDIOGRAM (TEE);  Surgeon: Fay Records, MD;   Location: [facility] ENDOSCOPY;  Service: Cardiovascular;  Laterality: N/A;  pre ablation  . TEE WITHOUT CARDIOVERSION N/A 09/20/2015   Procedure: TRANSESOPHAGEAL ECHOCARDIOGRAM (TEE);  Surgeon: Thayer Headings, MD;  Location: [place];  Service: Cardiovascular;  Laterality: N/A;  . TONSILLECTOMY     when child    Current Outpatient Prescriptions  Medication Sig Dispense Refill  . acyclovir (ZOVIRAX) 400 MG tablet Take 1 tablet (400 mg total) by mouth 3 (three) times daily as needed. Use as directed 30 tablet 1  . atorvastatin (LIPITOR) 40 MG tablet Take 1 tablet (40 mg total) by mouth daily. 90 tablet 3  . finasteride (PROSCAR) 5 MG tablet Take 1 tablet (5 mg total) by mouth daily. 90 tablet 3  . meclizine (ANTIVERT) 25 MG tablet Take 25 mg by mouth as directed. Take as needed for lightheadedness/vertigo    . multivitamin (THERAGRAN) per tablet Take 1 tablet by mouth daily.      Marland Kitchen omeprazole (PRILOSEC) 40 MG capsule Take 40 mg by mouth daily as needed (heartburn or acid reflux).    . rivaroxaban (XARELTO) 20 MG TABS tablet Take 1 tablet (20 mg total) by mouth daily with supper. 90 tablet 3   No current facility-administered medications for this encounter.     No Known Allergies  Social History   Social History  . Marital status: Single    Spouse name: N/A  . Number of children: N/A  . Years of education: N/A   Occupational History  . 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 Smoking status: Never Smoker  . Smokeless tobacco: Never Used     Comment: denies   . Alcohol use 0.6 oz/week    1 Cans of beer per week  . Drug use: No  . Sexual activity: Yes   Other Topics Concern  . Not on file   Social History Narrative  . No narrative on file    Family History  Problem Relation Age of Onset  . Hypertension Father   . Hypertension Brother   . Hyperlipidemia Brother   . Stroke Mother     ROS- All systems are reviewed and negative  except as per the HPI above  Physical Exam: Vitals:   01/21/17 0912  BP: 136/80  Pulse: 63  Weight: 182 lb 3.2 oz (82.6 kg)  Height: 5\' 9"  (1.753 m)   Wt Readings from Last 3 Encounters:  01/21/17 182 lb 3.2 oz (82.6 kg)  12/09/16 179 lb 9.6 oz (81.5 kg)  11/25/16 178 lb (80.7 kg)    Labs: Lab Results  Component Value Date   NA 139 11/01/2016   K 4.0 11/01/2016   CL 105 11/01/2016   CO2 24 11/01/2016   GLUCOSE 89 11/01/2016   BUN 16 11/01/2016   CREATININE 0.90 11/01/2016   CALCIUM 9.6 11/01/2016   MG 2.5 10/10/2008   Lab Results  Component Value Date   INR 2.3 08/09/2009   Lab Results  Component Value Date   CHOL 184 11/01/2016   HDL 61 11/01/2016   LDLCALC 102 (H) 11/01/2016   TRIG 103 11/01/2016     GEN- The patient is well appearing, alert and oriented x 3 today.   Head- normocephalic, atraumatic Eyes-  Sclera clear, conjunctiva pink Ears- hearing intact Oropharynx- clear Neck- supple, no JVP Lymph- no cervical lymphadenopathy Lungs- Clear to ausculation bilaterally, normal work of breathing Heart-regular rate and rhythm, no murmurs, rubs or gallops, PMI not laterally displaced GI- soft, NT, ND, + BS Extremities- no clubbing, cyanosis, or edema MS- no significant deformity or atrophy Skin- no rash or lesion Psych- euthymic mood, full affect Neuro- strength and sensation are intact  EKG-NSR at 63 bpm, pr int 186 ms, qrs int 82 ms, qtc 405 ms Epic records reviewed   Assessment and Plan:  1. Paroxysmal afib, s/p ablation x3, last ablation 11/30/16 Afib has been quiet for months Pt would like to try to stop cardizem and xarelto Will stop cardizem and after one week if no afib, he can stop xarelto with chadsvasc score of 1(age) Keep both drugs on hand if afib starts and continues for 24 hours or more  F/u with Dr. Rayann Heman as scheduled 6/14   Butch Penny C. Sicilia Killough, [facility] [address] [place], [place]  [id number] [phone]

## 2017-01-21 NOTE — Patient Instructions (Signed)
Your physician has recommended you make the following change in your medication:  1)Stop Diltiazem IF after a week no afib can stop xarelto --- if afib should return then restart diltiazem and xarelto.

## 2017-02-27 ENCOUNTER — Ambulatory Visit (INDEPENDENT_AMBULATORY_CARE_PROVIDER_SITE_OTHER): Payer: Medicare Other

## 2017-02-27 ENCOUNTER — Ambulatory Visit (INDEPENDENT_AMBULATORY_CARE_PROVIDER_SITE_OTHER): Payer: Medicare Other | Admitting: Podiatry

## 2017-02-27 ENCOUNTER — Encounter: Payer: Self-pay | Admitting: Podiatry

## 2017-02-27 DIAGNOSIS — M722 Plantar fascial fibromatosis: Secondary | ICD-10-CM | POA: Diagnosis not present

## 2017-02-27 MED ORDER — TRIAMCINOLONE ACETONIDE 10 MG/ML IJ SUSP
10.0000 mg | Freq: Once | INTRAMUSCULAR | Status: AC
Start: 2017-02-27 — End: 2017-02-27
  Administered 2017-02-27: 10 mg

## 2017-03-03 NOTE — Progress Notes (Signed)
Subjective:    Patient ID: Maurice [name], male   DOB: 66 y.o.   MRN: [id number]   HPI patient presents stating he's developed a lot of pain in the left heel over the last several months with worsening over the last month    ROS      Objective:  Physical Exam Neurovascular status intact with patient's left plantar heel been very tender in the medial band    Assessment:    Acute plantar fasciitis left     Plan:    Injected the left plantar fascia 3 mg Kenalog 5 mill grams Xylocaine and instructed on physical therapy supportive shoes and reappoint to recheck and applied fascial brace

## 2017-03-27 ENCOUNTER — Ambulatory Visit (INDEPENDENT_AMBULATORY_CARE_PROVIDER_SITE_OTHER): Payer: Medicare Other | Admitting: Internal Medicine

## 2017-03-27 ENCOUNTER — Encounter: Payer: Self-pay | Admitting: Internal Medicine

## 2017-03-27 VITALS — BP 122/80 | HR 76 | Ht 69.0 in | Wt 177.0 lb

## 2017-03-27 DIAGNOSIS — I481 Persistent atrial fibrillation: Secondary | ICD-10-CM

## 2017-03-27 DIAGNOSIS — I4819 Other persistent atrial fibrillation: Secondary | ICD-10-CM

## 2017-03-27 NOTE — Progress Notes (Signed)
PCP: [name], MD   Maurice [name] is a 66 y.o. male who presents today for routine electrophysiology followup.  Since last being seen in our clinic, the patient reports doing very well.  His afib is well controlled and he is pleased with current health state.  Today, he denies symptoms of palpitations, chest pain, shortness of breath,  lower extremity edema, dizziness, presyncope, or syncope.  The patient is otherwise without complaint today.   Past Medical History:  Diagnosis Date  . Atrial fibrillation (HCC)    paroxysmal s/p PVI by JA  . Diverticulosis    Colonoscopy 07/2013  . Herpes   . History of hepatitis B   . Hypercholesterolemia   . Varicose veins    Past Surgical History:  Procedure Laterality Date  . atrail fibrillation ablation  7/10, 1//17/14   PVI by JA  . ATRIAL FIBRILLATION ABLATION N/A 10/29/2012   Procedure: ATRIAL FIBRILLATION ABLATION;  Surgeon: Thompson Grayer, MD;  Location: [facility] CATH LAB;  Service: Cardiovascular;  Laterality: N/A;  . CARDIOVERSION N/A 09/20/2015   Procedure: CARDIOVERSION;  Surgeon: Thayer Headings, MD;  Location: [facility] ENDOSCOPY;  Service: Cardiovascular;  Laterality: N/A;  . CARDIOVERSION N/A 09/28/2015   Procedure: CARDIOVERSION;  Surgeon: Lelon Perla, MD;  Location: [place];  Service: Cardiovascular;  Laterality: N/A;  . COLONOSCOPY  2004  . ELECTROPHYSIOLOGIC STUDY N/A 11/28/2015   3rd AF ablation by Dr Rayann Heman  . TEE WITHOUT CARDIOVERSION  10/29/2012   Procedure: TRANSESOPHAGEAL ECHOCARDIOGRAM (TEE);  Surgeon: Fay Records, MD;  Location: [facility] ENDOSCOPY;  Service: Cardiovascular;  Laterality: N/A;  pre ablation  . TEE WITHOUT CARDIOVERSION N/A 09/20/2015   Procedure: TRANSESOPHAGEAL ECHOCARDIOGRAM (TEE);  Surgeon: Thayer Headings, MD;  Location: [facility] ENDOSCOPY;  Service: Cardiovascular;  Laterality: N/A;  . TONSILLECTOMY     when child    ROS- all systems are reviewed and negatives except as per HPI above  Current  Outpatient Prescriptions  Medication Sig Dispense Refill  . acyclovir (ZOVIRAX) 400 MG tablet Take 1 tablet (400 mg total) by mouth 3 (three) times daily as needed. Use as directed 30 tablet 1  . atorvastatin (LIPITOR) 40 MG tablet Take 1 tablet (40 mg total) by mouth daily. 90 tablet 3  . finasteride (PROSCAR) 5 MG tablet Take 1 tablet (5 mg total) by mouth daily. 90 tablet 3  . meclizine (ANTIVERT) 25 MG tablet Take 25 mg by mouth as directed. Take as needed for lightheadedness/vertigo    . multivitamin (THERAGRAN) per tablet Take 1 tablet by mouth daily.      Marland Kitchen omeprazole (PRILOSEC) 40 MG capsule Take 40 mg by mouth daily as needed (heartburn or acid reflux).     No current facility-administered medications for this visit.     Physical Exam: Vitals:   03/27/17 0949  BP: 122/80  Pulse: 76  SpO2: 97%  Weight: 177 lb (80.3 kg)  Height: 5\' 9"  (1.753 m)    GEN- The patient is well appearing, alert and oriented x 3 today.   Head- normocephalic, atraumatic Eyes-  Sclera clear, conjunctiva pink Ears- hearing intact Oropharynx- clear Lungs- Clear to ausculation bilaterally, normal work of breathing Heart- Regular rate and rhythm, no murmurs, rubs or gallops, PMI not laterally displaced GI- soft, NT, ND, + BS Extremities- no clubbing, cyanosis, or edema  EKG tracing ordered today is personally reviewed and shows sinus rhythm, normal ekg  Assessment and Plan:  1. Paroxysmal atrial fibrillation Doing well off AAD  therapy chads2vasc score is 1.  He is therefore not on anticoagulation  2. HL Stable No change required today  Return to see me in 1 year unless problems arise He will follow-up with the AF clinic for any arrhythmias in the interim  Thompson Grayer MD, [facility] 03/27/2017 10:15 AM

## 2017-03-27 NOTE — Patient Instructions (Signed)
Medication Instructions: Your physician recommends that you continue on your current medications as directed. Please refer to the Current Medication list given to you today.   Labwork: None Ordered  Procedures/Testing: None Ordered  Follow-Up: Your physician wants you to follow-up in: 36 MONTHS with Dr. Rayann Heman. You will receive a reminder letter in the mail two months in advance. If you don't receive a letter, please call our office to schedule the follow-up appointment.   Any Additional Special Instructions Will Be Listed Below (If Applicable).     If you need a refill on your cardiac medications before your next appointment, please call your pharmacy.

## 2017-06-19 DIAGNOSIS — D229 Melanocytic nevi, unspecified: Secondary | ICD-10-CM | POA: Diagnosis not present

## 2017-06-19 DIAGNOSIS — L570 Actinic keratosis: Secondary | ICD-10-CM | POA: Diagnosis not present

## 2017-06-19 DIAGNOSIS — L821 Other seborrheic keratosis: Secondary | ICD-10-CM | POA: Diagnosis not present

## 2017-07-03 DIAGNOSIS — H5203 Hypermetropia, bilateral: Secondary | ICD-10-CM | POA: Diagnosis not present

## 2017-07-23 ENCOUNTER — Other Ambulatory Visit: Payer: Self-pay | Admitting: Family Medicine

## 2017-07-23 DIAGNOSIS — Z8619 Personal history of other infectious and parasitic diseases: Secondary | ICD-10-CM

## 2017-07-25 ENCOUNTER — Other Ambulatory Visit (INDEPENDENT_AMBULATORY_CARE_PROVIDER_SITE_OTHER): Payer: Medicare Other

## 2017-07-25 DIAGNOSIS — Z23 Encounter for immunization: Secondary | ICD-10-CM | POA: Diagnosis not present

## 2017-11-13 ENCOUNTER — Ambulatory Visit: Payer: Medicare Other | Admitting: Family Medicine

## 2017-11-13 ENCOUNTER — Encounter: Payer: Self-pay | Admitting: Family Medicine

## 2017-11-13 VITALS — BP 110/70 | HR 60 | Ht 68.25 in | Wt 178.0 lb

## 2017-11-13 DIAGNOSIS — Z8042 Family history of malignant neoplasm of prostate: Secondary | ICD-10-CM

## 2017-11-13 DIAGNOSIS — Z8679 Personal history of other diseases of the circulatory system: Secondary | ICD-10-CM

## 2017-11-13 DIAGNOSIS — E785 Hyperlipidemia, unspecified: Secondary | ICD-10-CM | POA: Diagnosis not present

## 2017-11-13 DIAGNOSIS — Z0000 Encounter for general adult medical examination without abnormal findings: Secondary | ICD-10-CM

## 2017-11-13 DIAGNOSIS — Z23 Encounter for immunization: Secondary | ICD-10-CM | POA: Diagnosis not present

## 2017-11-13 DIAGNOSIS — Z8619 Personal history of other infectious and parasitic diseases: Secondary | ICD-10-CM | POA: Diagnosis not present

## 2017-11-13 DIAGNOSIS — K219 Gastro-esophageal reflux disease without esophagitis: Secondary | ICD-10-CM | POA: Diagnosis not present

## 2017-11-13 DIAGNOSIS — L649 Androgenic alopecia, unspecified: Secondary | ICD-10-CM | POA: Diagnosis not present

## 2017-11-13 DIAGNOSIS — Z86010 Personal history of colonic polyps: Secondary | ICD-10-CM

## 2017-11-13 DIAGNOSIS — I10 Essential (primary) hypertension: Secondary | ICD-10-CM

## 2017-11-13 DIAGNOSIS — K5730 Diverticulosis of large intestine without perforation or abscess without bleeding: Secondary | ICD-10-CM | POA: Diagnosis not present

## 2017-11-13 DIAGNOSIS — E7800 Pure hypercholesterolemia, unspecified: Secondary | ICD-10-CM | POA: Diagnosis not present

## 2017-11-13 MED ORDER — ATORVASTATIN CALCIUM 40 MG PO TABS: 40.0000 mg | ORAL_TABLET | Freq: Every day | ORAL | 3 refills | Status: DC

## 2017-11-13 MED ORDER — FINASTERIDE 5 MG PO TABS: 5.0000 mg | ORAL_TABLET | Freq: Every day | ORAL | 3 refills | Status: DC

## 2017-11-13 NOTE — Patient Instructions (Signed)
Use Afrin nasal spray for the next 3 or 4 days and see what it does to help with your ears

## 2017-11-13 NOTE — Progress Notes (Signed)
Maurice [name] is a 67 y.o. male who presents for annual wellness visit and follow-up on chronic medical conditions.  He does have an underlying history of atrial fibrillation and is followed regularly by cardiology.  Presently he is having no difficulty.  He does have a history of reflux disease and does occasionally use Prilosec.  He does have a family history of prostate cancer.  He did have colonic polyp and is now roughly 5 years.  He plans to call GI to set this up for later in the year.  He continues on finasteride for his male pattern baldness and is really not having any BPH related symptoms.  He does have a history of herpes labialis but presently is having no difficulty with that. He is taking Lipitor and having no difficulty with that.  Immunizations and Health Maintenance Immunization History  Administered Date(s) Administered  . Hepatitis A 02/15/2009  . IPV 10/14/1962  . Influenza Split 07/23/2013, 07/22/2015  . Influenza, High Dose Seasonal PF 07/25/2017  . Influenza-Unspecified 07/28/2014, 07/26/2016  . MMR 10/15/1959, 09/23/1988  . Meningococcal Polysaccharide 07/23/2013  . Pneumococcal Conjugate-13 11/01/2016  . Tdap 07/30/2007  . Zoster 05/18/2013   Health Maintenance Due  Topic Date Due  . TETANUS/TDAP  07/29/2017  . PNA vac Low Risk Adult (2 of 2 - PPSV23) 11/01/2017    Last colonoscopy:  2014 Last PSA: January 2018 Dentist: December 2018 Ophtho: August 2018 Exercise: excerise 4 days a week  Other doctors caring for patient include: Dr. Rayann [name]- Cardiologist  Advanced Directives:    Depression screen:  See questionnaire below.     Depression screen [facility] 2/9 11/13/2017 11/01/2016 05/30/2015 05/19/2014  Decreased Interest 0 0 0 0  Down, Depressed, Hopeless 0 0 0 0  PHQ - 2 Score 0 0 0 0    Fall Screen: See Questionaire below.   Fall Risk  11/13/2017 11/01/2016 05/30/2015 05/19/2014  Falls in the past year? No No No Yes  Number falls in past yr: - - - 1   Injury with Fall? - - - Yes  Risk for fall due to : - - - Other (Comment)  Risk for fall due to: Comment - - - PT FELL OF LATTER     ADL screen:  See questionnaire below.  Functional Status Survey:     Review of Systems  Constitutional: -, -unexpected weight change, -anorexia, -fatigue Allergy: -sneezing, -itching, -congestion Dermatology: denies changing moles, rash, lumps ENT: -runny nose, -ear pain, -sore throat,  Cardiology:  -chest pain, -palpitations, -orthopnea, Respiratory: -cough, -shortness of breath, -dyspnea on exertion, -wheezing,  Gastroenterology: -abdominal pain, -nausea, -vomiting, -diarrhea, -constipation, -dysphagia Hematology: -bleeding or bruising problems Musculoskeletal: -arthralgias, -myalgias, -joint swelling, -back pain, - Ophthalmology: -vision changes,  Urology: -dysuria, -difficulty urinating,  -urinary frequency, -urgency, incontinence Neurology: -, -numbness, , -memory loss, -falls, -dizziness    PHYSICAL EXAM:  Ht 5' 8.25" (1.734 m)   Wt 178 lb (80.7 kg)   BMI 26.87 kg/m   General Appearance: Alert, cooperative, no distress, appears stated age Head: Normocephalic, without obvious abnormality, atraumatic Eyes: PERRL, conjunctiva/corneas clear, EOM's intact, fundi benign Ears: Normal TM's and external ear canals Nose: Nares normal, mucosa normal, no drainage or sinus   tenderness Throat: Lips, mucosa, and tongue normal; teeth and gums normal Neck: Supple, no lymphadenopathy, thyroid:no enlargement/tenderness/nodules; no carotid bruit or JVD Lungs: Clear to auscultation bilaterally without wheezes, rales or ronchi; respirations unlabored Heart: Regular rate and rhythm, S1 and S2 normal, no murmur, rub or  gallop Abdomen: Soft, non-tender, nondistended, normoactive bowel sounds, no masses, no hepatosplenomegaly Extremities: No clubbing, cyanosis or edema Pulses: 2+ and symmetric all extremities Skin: Skin color, texture, turgor normal, no  rashes or lesions Lymph nodes: Cervical, supraclavicular, and axillary nodes normal Neurologic: CNII-XII intact, normal strength, sensation and gait; reflexes 2+ and symmetric throughout   Psych: Normal mood, affect, hygiene and grooming  ASSESSMENT/PLAN: History of atrial fibrillation  Gastroesophageal reflux disease without esophagitis  Family history of prostate cancer - Plan: PSA  Diverticulosis of large intestine without hemorrhage  Essential hypertension, benign - Plan: CBC with Differential/Platelet, Comprehensive metabolic panel  History of colonic polyps  Male pattern baldness - Plan: finasteride (PROSCAR) 5 MG tablet  Pure hypercholesterolemia - Plan: Lipid panel  History of herpes labialis  Need for vaccination against Streptococcus pneumoniae - Plan: Pneumococcal polysaccharide vaccine 23-valent greater than or equal to 2yo subcutaneous/IM  Hyperlipidemia, unspecified hyperlipidemia type - Plan: atorvastatin (LIPITOR) 40 MG tablet He will call GI to set up for follow-up colonoscopy.      Medicare Attestation I have personally reviewed: The patient's medical and social history Their use of alcohol, tobacco or illicit drugs Their current medications and supplements The patient's functional ability including ADLs,fall risks, home safety risks, cognitive, and hearing and visual impairment Diet and physical activities Evidence for depression or mood disorders  The patient's weight, height, and BMI have been recorded in the chart.  I have made referrals, counseling, and provided education to the patient based on review of the above and I have provided the patient with a written personalized care plan for preventive services.      , MD   11/13/2017     

## 2017-11-14 LAB — LIPID PANEL
CHOLESTEROL TOTAL: 164 mg/dL (ref 100–199)
Chol/HDL Ratio: 3.5 ratio (ref 0.0–5.0)
HDL: 47 mg/dL (ref >39)
LDL Calculated: 101 mg/dL — ABNORMAL HIGH (ref 0–99)
TRIGLYCERIDES: 80 mg/dL (ref 0–149)
VLDL CHOLESTEROL CAL: 16 mg/dL (ref 5–40)

## 2017-11-14 LAB — COMPREHENSIVE METABOLIC PANEL
ALK PHOS: 80 IU/L (ref 39–117)
ALT: 30 IU/L (ref 0–44)
AST: 24 IU/L (ref 0–40)
Albumin/Globulin Ratio: 1.9 (ref 1.2–2.2)
Albumin: 4.3 g/dL (ref 3.6–4.8)
BILIRUBIN TOTAL: 0.7 mg/dL (ref 0.0–1.2)
BUN / CREAT RATIO: 15 (ref 10–24)
BUN: 15 mg/dL (ref 8–27)
CHLORIDE: 103 mmol/L (ref 96–106)
CO2: 23 mmol/L (ref 20–29)
CREATININE: 0.97 mg/dL (ref 0.76–1.27)
Calcium: 9.5 mg/dL (ref 8.6–10.2)
GFR calc Af Amer: 94 mL/min/{1.73_m2} (ref >59)
GFR calc non Af Amer: 81 mL/min/{1.73_m2} (ref >59)
GLUCOSE: 88 mg/dL (ref 65–99)
Globulin, Total: 2.3 g/dL (ref 1.5–4.5)
Potassium: 4.5 mmol/L (ref 3.5–5.2)
Sodium: 142 mmol/L (ref 134–144)
Total Protein: 6.6 g/dL (ref 6.0–8.5)

## 2017-11-14 LAB — CBC WITH DIFFERENTIAL/PLATELET
BASOS ABS: 0 10*3/uL (ref 0.0–0.2)
Basos: 0 %
EOS (ABSOLUTE): 0.1 10*3/uL (ref 0.0–0.4)
Eos: 1 %
HEMOGLOBIN: 15.6 g/dL (ref 13.0–17.7)
Hematocrit: 44.2 % (ref 37.5–51.0)
Immature Grans (Abs): 0 10*3/uL (ref 0.0–0.1)
Immature Granulocytes: 0 %
LYMPHS ABS: 1.6 10*3/uL (ref 0.7–3.1)
Lymphs: 27 %
MCH: 29.8 pg (ref 26.6–33.0)
MCHC: 35.3 g/dL (ref 31.5–35.7)
MCV: 85 fL (ref 79–97)
MONOS ABS: 0.9 10*3/uL (ref 0.1–0.9)
Monocytes: 15 %
NEUTROS ABS: 3.2 10*3/uL (ref 1.4–7.0)
Neutrophils: 57 %
PLATELETS: 196 10*3/uL (ref 150–379)
RBC: 5.23 x10E6/uL (ref 4.14–5.80)
RDW: 13.5 % (ref 12.3–15.4)
WBC: 5.7 10*3/uL (ref 3.4–10.8)

## 2017-11-14 LAB — PSA: PROSTATE SPECIFIC AG, SERUM: 0.2 ng/mL (ref 0.0–4.0)

## 2017-12-19 ENCOUNTER — Other Ambulatory Visit: Payer: Self-pay | Admitting: Family Medicine

## 2017-12-19 DIAGNOSIS — Z8619 Personal history of other infectious and parasitic diseases: Secondary | ICD-10-CM

## 2018-03-10 DIAGNOSIS — H8113 Benign paroxysmal vertigo, bilateral: Secondary | ICD-10-CM | POA: Diagnosis not present

## 2018-03-31 ENCOUNTER — Other Ambulatory Visit: Payer: Self-pay | Admitting: Family Medicine

## 2018-03-31 DIAGNOSIS — Z8619 Personal history of other infectious and parasitic diseases: Secondary | ICD-10-CM

## 2018-03-31 NOTE — Telephone Encounter (Signed)
[facility] is requesting to fill acyclovir. Please advise [facility]

## 2018-04-21 DIAGNOSIS — D229 Melanocytic nevi, unspecified: Secondary | ICD-10-CM | POA: Diagnosis not present

## 2018-04-21 DIAGNOSIS — L820 Inflamed seborrheic keratosis: Secondary | ICD-10-CM | POA: Diagnosis not present

## 2018-05-19 DIAGNOSIS — L03114 Cellulitis of left upper limb: Secondary | ICD-10-CM | POA: Diagnosis not present

## 2018-05-19 DIAGNOSIS — T148XXA Other injury of unspecified body region, initial encounter: Secondary | ICD-10-CM | POA: Diagnosis not present

## 2018-06-23 DIAGNOSIS — R1310 Dysphagia, unspecified: Secondary | ICD-10-CM | POA: Diagnosis not present

## 2018-06-23 DIAGNOSIS — Z86010 Personal history of colonic polyps: Secondary | ICD-10-CM | POA: Diagnosis not present

## 2018-06-23 DIAGNOSIS — K219 Gastro-esophageal reflux disease without esophagitis: Secondary | ICD-10-CM | POA: Diagnosis not present

## 2018-06-23 DIAGNOSIS — K5730 Diverticulosis of large intestine without perforation or abscess without bleeding: Secondary | ICD-10-CM | POA: Diagnosis not present

## 2018-07-06 DIAGNOSIS — Z1211 Encounter for screening for malignant neoplasm of colon: Secondary | ICD-10-CM | POA: Diagnosis not present

## 2018-07-06 DIAGNOSIS — Z86010 Personal history of colonic polyps: Secondary | ICD-10-CM | POA: Diagnosis not present

## 2018-07-06 DIAGNOSIS — D122 Benign neoplasm of ascending colon: Secondary | ICD-10-CM | POA: Diagnosis not present

## 2018-07-06 DIAGNOSIS — K210 Gastro-esophageal reflux disease with esophagitis: Secondary | ICD-10-CM | POA: Diagnosis not present

## 2018-07-06 DIAGNOSIS — R1310 Dysphagia, unspecified: Secondary | ICD-10-CM | POA: Diagnosis not present

## 2018-07-06 DIAGNOSIS — K635 Polyp of colon: Secondary | ICD-10-CM | POA: Diagnosis not present

## 2018-07-06 LAB — HM COLONOSCOPY

## 2018-07-07 ENCOUNTER — Encounter: Payer: Self-pay | Admitting: Family Medicine

## 2018-07-07 DIAGNOSIS — K208 Other esophagitis without bleeding: Secondary | ICD-10-CM | POA: Insufficient documentation

## 2018-07-09 ENCOUNTER — Encounter: Payer: Self-pay | Admitting: Family Medicine

## 2018-07-09 DIAGNOSIS — H524 Presbyopia: Secondary | ICD-10-CM | POA: Diagnosis not present

## 2018-07-13 DIAGNOSIS — Z86010 Personal history of colonic polyps: Secondary | ICD-10-CM | POA: Insufficient documentation

## 2018-07-14 ENCOUNTER — Encounter: Payer: Self-pay | Admitting: Family Medicine

## 2018-07-16 DIAGNOSIS — C4491 Basal cell carcinoma of skin, unspecified: Secondary | ICD-10-CM

## 2018-07-16 DIAGNOSIS — L820 Inflamed seborrheic keratosis: Secondary | ICD-10-CM | POA: Diagnosis not present

## 2018-07-16 DIAGNOSIS — C44311 Basal cell carcinoma of skin of nose: Secondary | ICD-10-CM | POA: Diagnosis not present

## 2018-07-16 DIAGNOSIS — D229 Melanocytic nevi, unspecified: Secondary | ICD-10-CM | POA: Diagnosis not present

## 2018-07-16 DIAGNOSIS — B079 Viral wart, unspecified: Secondary | ICD-10-CM | POA: Diagnosis not present

## 2018-07-16 HISTORY — DX: Basal cell carcinoma of skin, unspecified: C44.91

## 2018-08-04 ENCOUNTER — Telehealth: Payer: Self-pay | Admitting: Family Medicine

## 2018-08-04 DIAGNOSIS — Z86010 Personal history of colonic polyps: Secondary | ICD-10-CM | POA: Diagnosis not present

## 2018-08-04 DIAGNOSIS — K210 Gastro-esophageal reflux disease with esophagitis: Secondary | ICD-10-CM | POA: Diagnosis not present

## 2018-08-04 DIAGNOSIS — K5730 Diverticulosis of large intestine without perforation or abscess without bleeding: Secondary | ICD-10-CM | POA: Diagnosis not present

## 2018-08-04 NOTE — Telephone Encounter (Signed)
Put in. [facility] 08-04-18

## 2018-08-04 NOTE — Telephone Encounter (Signed)
Pt came in and dropped off Shingrix vaccine received at pharmacy paper work. Sending back to kim to abstract.

## 2018-11-03 DIAGNOSIS — C44311 Basal cell carcinoma of skin of nose: Secondary | ICD-10-CM | POA: Diagnosis not present

## 2018-11-03 HISTORY — PX: SKIN BIOPSY: SHX1

## 2018-12-08 DIAGNOSIS — J189 Pneumonia, unspecified organism: Secondary | ICD-10-CM | POA: Diagnosis not present

## 2018-12-11 DIAGNOSIS — R030 Elevated blood-pressure reading, without diagnosis of hypertension: Secondary | ICD-10-CM | POA: Diagnosis not present

## 2018-12-11 DIAGNOSIS — J189 Pneumonia, unspecified organism: Secondary | ICD-10-CM | POA: Diagnosis not present

## 2018-12-31 ENCOUNTER — Other Ambulatory Visit: Payer: Self-pay | Admitting: Family Medicine

## 2018-12-31 DIAGNOSIS — L649 Androgenic alopecia, unspecified: Secondary | ICD-10-CM

## 2019-01-06 ENCOUNTER — Other Ambulatory Visit: Payer: Self-pay

## 2019-01-06 ENCOUNTER — Ambulatory Visit (INDEPENDENT_AMBULATORY_CARE_PROVIDER_SITE_OTHER): Payer: Medicare Other | Admitting: Family Medicine

## 2019-01-06 ENCOUNTER — Encounter: Payer: Self-pay | Admitting: Family Medicine

## 2019-01-06 VITALS — BP 130/80 | Ht 69.0 in | Wt 175.0 lb

## 2019-01-06 DIAGNOSIS — R42 Dizziness and giddiness: Secondary | ICD-10-CM

## 2019-01-06 NOTE — Progress Notes (Signed)
   Subjective:    Patient ID: Maurice [name], male    DOB: 1951/05/22, 68 y.o.   MRN: [id number]  HPI Virtual office visit. He has a 17-month history of difficulty with dizziness.  He describes 2 different kinds of dizziness.  One occurs when he gets out of bed quickly and moves in his dizzy for 10 seconds or less and then this goes away.  He then describes another episode of dizziness when he moves his head and certain directions he will be dizzy for a few seconds.  He does complain of some slight sinus pressure as well as a dull headache.  He apparently does not have underlying allergies.  No fever, chills, sneezing, itchy watery eyes.  Review of Systems     Objective:   Physical Exam Alert and in no distress otherwise not examined       Assessment & Plan:  Vertigo I explained that getting up too quickly and dizziness for a few seconds is related to postural changes moving quicker than has body can adjust.  Explained that he needs to get up, sit and stand and this should take care of it. The other type of vertigo sounds more positional related.  Recommend using meclizine that he has at home as well as a decongestant.  He will keep me informed concerning this.

## 2019-01-07 ENCOUNTER — Other Ambulatory Visit: Payer: Self-pay | Admitting: Family Medicine

## 2019-01-07 DIAGNOSIS — E785 Hyperlipidemia, unspecified: Secondary | ICD-10-CM

## 2019-02-03 ENCOUNTER — Telehealth (HOSPITAL_COMMUNITY): Payer: Self-pay | Admitting: *Deleted

## 2019-02-03 NOTE — Telephone Encounter (Signed)
Patient called in stating he continues having issues with dizziness especially with standing but also when lying down sometimes. He spoke with his PCP regarding this several weeks ago states the decongestants recommended have not changed anything. Pt states hes worried his BP maybe abnormal - pt is going to keep a log of his BP/HR over the next week and let me know if abnormalities. Encouraged pt to follow up with PCP since symptoms have persistent as pt does sound congested over the phone.

## 2019-02-11 ENCOUNTER — Encounter: Payer: Self-pay | Admitting: Family Medicine

## 2019-02-11 ENCOUNTER — Other Ambulatory Visit: Payer: Self-pay

## 2019-02-11 ENCOUNTER — Ambulatory Visit: Payer: Medicare Other | Admitting: Family Medicine

## 2019-02-11 VITALS — BP 122/84 | HR 61 | Temp 97.7°F | Ht 69.0 in | Wt 178.2 lb

## 2019-02-11 DIAGNOSIS — I951 Orthostatic hypotension: Secondary | ICD-10-CM

## 2019-02-11 DIAGNOSIS — Z8679 Personal history of other diseases of the circulatory system: Secondary | ICD-10-CM

## 2019-02-11 DIAGNOSIS — H8113 Benign paroxysmal vertigo, bilateral: Secondary | ICD-10-CM | POA: Diagnosis not present

## 2019-02-11 NOTE — Patient Instructions (Signed)
How to Perform the Epley Maneuver  The Epley maneuver is an exercise that relieves symptoms of vertigo. Vertigo is the feeling that you or your surroundings are moving when they are not. When you feel vertigo, you may feel like the room is spinning and have trouble walking. Dizziness is a little different than vertigo. When you are dizzy, you may feel unsteady or light-headed.  You can do this maneuver at home whenever you have symptoms of vertigo. You can do it up to 3 times a day until your symptoms go away.  Even though the Epley maneuver may relieve your vertigo for a few weeks, it is possible that your symptoms will return. This maneuver relieves vertigo, but it does not relieve dizziness.  What are the risks?  If it is done correctly, the Epley maneuver is considered safe. Sometimes it can lead to dizziness or nausea that goes away after a short time. If you develop other symptoms, such as changes in vision, weakness, or numbness, stop doing the maneuver and call your health care provider.  How to perform the Epley maneuver  1. Sit on the edge of a bed or table with your back straight and your legs extended or hanging over the edge of the bed or table.  2. Turn your head halfway toward the affected ear or side.  3. Lie backward quickly with your head turned until you are lying flat on your back. You may want to position a pillow under your shoulders.  4. Hold this position for 30 seconds. You may experience an attack of vertigo. This is normal.  5. Turn your head to the opposite direction until your unaffected ear is facing the floor.  6. Hold this position for 30 seconds. You may experience an attack of vertigo. This is normal. Hold this position until the vertigo stops.  7. Turn your whole body to the same side as your head. Hold for another 30 seconds.  8. Sit back up.  You can repeat this exercise up to 3 times a day.  Follow these instructions at home:   After doing the Epley maneuver, you can return to  your normal activities.   Ask your health care provider if there is anything you should do at home to prevent vertigo. He or she may recommend that you:  ? Keep your head raised (elevated) with two or more pillows while you sleep.  ? Do not sleep on the side of your affected ear.  ? Get up slowly from bed.  ? Avoid sudden movements during the day.  ? Avoid extreme head movement, like looking up or bending over.  Contact a health care provider if:   Your vertigo gets worse.   You have other symptoms, including:  ? Nausea.  ? Vomiting.  ? Headache.  Get help right away if:   You have vision changes.   You have a severe or worsening headache or neck pain.   You cannot stop vomiting.   You have new numbness or weakness in any part of your body.  Summary   Vertigo is the feeling that you or your surroundings are moving when they are not.   The Epley maneuver is an exercise that relieves symptoms of vertigo.   If the Epley maneuver is done correctly, it is considered safe. You can do it up to 3 times a day.  This information is not intended to replace advice given to you by your health care provider. Make sure   you discuss any questions you have with your health care provider.  Document Released: 10/05/2013 Document Revised: 08/20/2016 Document Reviewed: 08/20/2016  [employer]vier Interactive Patient Education  2019 [employer]  \

## 2019-02-11 NOTE — Progress Notes (Signed)
   Subjective:    Patient ID: Maurice [name], male    DOB: 02-Dec-1950, 68 y.o.   MRN: [id number]  HPI He is here for consult concerning difficulty with dizziness.  He has a several month history of difficulty with this.  He does have an underlying history of PAF with 3 ablations.  He is presently using diltiazem if he has difficulty with AF.  He usually can tell when that occurs.  He describes dizziness when he is lying down and turns his head to the right and then when he straightens it up it does get better.  He has it to a lesser extent when he goes to the left. He also describes dizziness if he goes from a lying to sitting too quickly or to standing too quickly.  This lasts for several seconds and then goes away.  No numbness, tingling, weakness.   Review of Systems     Objective:   Physical Exam Alert and in no distress.  EOMI.  Other cranial nerves intact.  No bruits noted.  DTRs normal.  Marye Round was questionably positive going to the right.      Assessment & Plan:  Benign paroxysmal positional vertigo due to bilateral vestibular disorder  Postural hypotension  History of atrial fibrillation Information concerning Epley maneuvers was given.  If this does not work I will refer to physical therapy for more formal Epley maneuvers. Discussed the postural hypotension with him.  Essentially recommended he go from lying to sitting to standing a little more slowly.  Demonstrated this to him and actually his symptoms did improve when he went slower. He will continue to monitor his PAF and use the diltiazem as needed.  He was comfortable with this.  Greater than 25 minutes, over 50% spent in counseling and coordination of care

## 2019-02-26 ENCOUNTER — Encounter: Payer: Self-pay | Admitting: Family Medicine

## 2019-02-26 ENCOUNTER — Ambulatory Visit (INDEPENDENT_AMBULATORY_CARE_PROVIDER_SITE_OTHER): Payer: Medicare Other | Admitting: Family Medicine

## 2019-02-26 ENCOUNTER — Other Ambulatory Visit: Payer: Self-pay

## 2019-02-26 VITALS — BP 124/80 | HR 54 | Temp 98.9°F | Wt 177.0 lb

## 2019-02-26 DIAGNOSIS — E7800 Pure hypercholesterolemia, unspecified: Secondary | ICD-10-CM

## 2019-02-26 DIAGNOSIS — Z8679 Personal history of other diseases of the circulatory system: Secondary | ICD-10-CM

## 2019-02-26 DIAGNOSIS — K219 Gastro-esophageal reflux disease without esophagitis: Secondary | ICD-10-CM | POA: Diagnosis not present

## 2019-02-26 DIAGNOSIS — Z0000 Encounter for general adult medical examination without abnormal findings: Secondary | ICD-10-CM

## 2019-02-26 DIAGNOSIS — Z8619 Personal history of other infectious and parasitic diseases: Secondary | ICD-10-CM

## 2019-02-26 DIAGNOSIS — Z86010 Personal history of colonic polyps: Secondary | ICD-10-CM

## 2019-02-26 DIAGNOSIS — Z125 Encounter for screening for malignant neoplasm of prostate: Secondary | ICD-10-CM

## 2019-02-26 DIAGNOSIS — L649 Androgenic alopecia, unspecified: Secondary | ICD-10-CM

## 2019-02-26 DIAGNOSIS — K5790 Diverticulosis of intestine, part unspecified, without perforation or abscess without bleeding: Secondary | ICD-10-CM

## 2019-02-26 DIAGNOSIS — I10 Essential (primary) hypertension: Secondary | ICD-10-CM

## 2019-02-26 DIAGNOSIS — Z8042 Family history of malignant neoplasm of prostate: Secondary | ICD-10-CM | POA: Diagnosis not present

## 2019-02-26 NOTE — Progress Notes (Signed)
Maurice [name] is a 68 y.o. male who presents for annual wellness visit,CPE and follow-up on chronic medical conditions.  He does have a family history of prostate cancer.  He would like to have another PSA.  Last year he had a colonoscopy which did show adenomatous polyp and is scheduled for 5 years.  He also has a history of diverticulosis.  His reflux seems to be under good control.  He does have a history of atrial fibrillation and occasionally runs into problems with this.  He is followed by cardiology for that.  He continues on his Lipitor and having no difficulty with that.  He has not had an outbreak of herpes in quite some time.  He does have a history of male pattern baldness but seems to be quite stable.  His work and home life are quite stable.   Immunizations and Health Maintenance Immunization History  Administered Date(s) Administered  . Hepatitis A 02/15/2009  . IPV 10/14/1962  . Influenza Split 07/23/2013, 07/22/2015  . Influenza, High Dose Seasonal PF 07/25/2017  . Influenza-Unspecified 07/28/2014, 07/26/2016  . MMR 10/15/1959, 09/23/1988  . Meningococcal Polysaccharide 07/23/2013  . Pneumococcal Conjugate-13 11/01/2016  . Pneumococcal Polysaccharide-23 11/13/2017  . Tdap 07/30/2007  . Zoster 05/18/2013  . Zoster Recombinat (Shingrix) 03/17/2018, 05/19/2018   Health Maintenance Due  Topic Date Due  . TETANUS/TDAP  07/29/2017    Last colonoscopy:07-06-18 Last PSA:11/13/17 Dentist: q nine months Ophtho: 05/2018 Exercise: weight lifting , walking  Other doctors caring for patient include: Dr.Allred Cardio, Dr. Paulla Dolly podiatry, Dr. Collene Mares GI  Advanced Directives:yes    Depression screen:  See questionnaire below.     Depression screen [facility] 2/9 11/13/2017 11/01/2016 05/30/2015 05/19/2014  Decreased Interest 0 0 0 0  Down, Depressed, Hopeless 0 0 0 0  PHQ - 2 Score 0 0 0 0    Fall Screen: See Questionaire below.   Fall Risk  11/13/2017 11/01/2016 05/30/2015 05/19/2014   Falls in the past year? No No No Yes  Number falls in past yr: - - - 1  Injury with Fall? - - - Yes  Risk for fall due to : - - - Other (Comment)  Risk for fall due to: Comment - - - PT FELL OF LATTER     ADL screen:  See questionnaire below.  Functional Status Survey:     Review of Systems  Constitutional: -, -unexpected weight change, -anorexia, -fatigue Allergy: -sneezing, -itching, -congestion Dermatology: denies changing moles, rash, lumps ENT: -runny nose, -ear pain, -sore throat,  Cardiology:  -chest pain, -palpitations, -orthopnea, Respiratory: -cough, -shortness of breath, -dyspnea on exertion, -wheezing,  Gastroenterology: -abdominal pain, -nausea, -vomiting, -diarrhea, -constipation, -dysphagia Hematology: -bleeding or bruising problems Musculoskeletal: -arthralgias, -myalgias, -joint swelling, -back pain, - Ophthalmology: -vision changes,  Urology: -dysuria, -difficulty urinating,  -urinary frequency, -urgency, incontinence Neurology: -, -numbness, , -memory loss, -falls, -dizziness    PHYSICAL EXAM:   General Appearance: Alert, cooperative, no distress, appears stated age Head: Normocephalic, without obvious abnormality, atraumatic Eyes: PERRL, conjunctiva/corneas clear, EOM's intact, fundi benign Ears: Normal TM's and external ear canals Nose: Nares normal, mucosa normal, no drainage or sinus   tenderness Throat: Lips, mucosa, and tongue normal; teeth and gums normal Neck: Supple, no lymphadenopathy, thyroid:no enlargement/tenderness/nodules; no carotid bruit or JVD Lungs: Clear to auscultation bilaterally without wheezes, rales or ronchi; respirations unlabored Heart: Regular rate and rhythm, S1 and S2 normal, no murmur, rub or gallop Abdomen: Soft, non-tender, nondistended, normoactive bowel sounds, no masses, no  hepatosplenomegaly Extremities: No clubbing, cyanosis or edema Pulses: 2+ and symmetric all extremities Skin: Skin color, texture, turgor  normal, no rashes or lesions Lymph nodes: Cervical, supraclavicular, and axillary nodes normal Neurologic: CNII-XII intact, normal strength, sensation and gait; reflexes 2+ and symmetric throughout   Psych: Normal mood, affect, hygiene and grooming  ASSESSMENT/PLAN: Routine general medical examination at a health care facility  History of colonic polyps  Diverticulosis  Family history of prostate cancer - Plan: CANCELED: PSA  Gastroesophageal reflux disease without esophagitis  History of atrial fibrillation  Pure hypercholesterolemia - Plan: Lipid panel  History of herpes labialis  Male pattern baldness  Essential hypertension, benign - Plan: CBC with Differential/Platelet, Comprehensive metabolic panel  Screening for prostate cancer - Plan: PSA, CANCELED: PSA He is to continue on his present medication regimen.  Routine blood screening will be done including PSA.  At this point he does not need any of his medications renewed as they were recently done.  He will attempt to get the Tdap report from [facility] which is the only immunization we do not have adequate record on. . Immunization recommendations discussed.  Colonoscopy recommendations reviewed.   Medicare Attestation I have personally reviewed: The patient's medical and social history Their use of alcohol, tobacco or illicit drugs Their current medications and supplements The patient's functional ability including ADLs,fall risks, home safety risks, cognitive, and hearing and visual impairment Diet and physical activities Evidence for depression or mood disorders  The patient's weight, height, and BMI have been recorded in the chart.  I have made referrals, counseling, and provided education to the patient based on review of the above and I have provided the patient with a written personalized care plan for preventive services.     [name], MD   02/26/2019

## 2019-02-27 LAB — COMPREHENSIVE METABOLIC PANEL
ALT: 42 IU/L (ref 0–44)
AST: 28 IU/L (ref 0–40)
Albumin/Globulin Ratio: 2 (ref 1.2–2.2)
Albumin: 4.5 g/dL (ref 3.8–4.8)
Alkaline Phosphatase: 68 IU/L (ref 39–117)
BUN/Creatinine Ratio: 14 (ref 10–24)
BUN: 15 mg/dL (ref 8–27)
Bilirubin Total: 0.9 mg/dL (ref 0.0–1.2)
CO2: 22 mmol/L (ref 20–29)
Calcium: 9.2 mg/dL (ref 8.6–10.2)
Chloride: 103 mmol/L (ref 96–106)
Creatinine, Ser: 1.08 mg/dL (ref 0.76–1.27)
GFR calc Af Amer: 82 mL/min/{1.73_m2} (ref >59)
GFR calc non Af Amer: 71 mL/min/{1.73_m2} (ref >59)
Globulin, Total: 2.2 g/dL (ref 1.5–4.5)
Glucose: 90 mg/dL (ref 65–99)
Potassium: 4.6 mmol/L (ref 3.5–5.2)
Sodium: 141 mmol/L (ref 134–144)
Total Protein: 6.7 g/dL (ref 6.0–8.5)

## 2019-02-27 LAB — CBC WITH DIFFERENTIAL/PLATELET
Basophils Absolute: 0 10*3/uL (ref 0.0–0.2)
Basos: 1 %
EOS (ABSOLUTE): 0.1 10*3/uL (ref 0.0–0.4)
Eos: 1 %
Hematocrit: 48.2 % (ref 37.5–51.0)
Hemoglobin: 16.8 g/dL (ref 13.0–17.7)
Immature Grans (Abs): 0 10*3/uL (ref 0.0–0.1)
Immature Granulocytes: 0 %
Lymphocytes Absolute: 2.2 10*3/uL (ref 0.7–3.1)
Lymphs: 41 %
MCH: 30.3 pg (ref 26.6–33.0)
MCHC: 34.9 g/dL (ref 31.5–35.7)
MCV: 87 fL (ref 79–97)
Monocytes Absolute: 0.6 10*3/uL (ref 0.1–0.9)
Monocytes: 11 %
Neutrophils Absolute: 2.6 10*3/uL (ref 1.4–7.0)
Neutrophils: 46 %
Platelets: 206 10*3/uL (ref 150–450)
RBC: 5.55 x10E6/uL (ref 4.14–5.80)
RDW: 12.9 % (ref 11.6–15.4)
WBC: 5.5 10*3/uL (ref 3.4–10.8)

## 2019-02-27 LAB — LIPID PANEL
Chol/HDL Ratio: 3.3 ratio (ref 0.0–5.0)
Cholesterol, Total: 174 mg/dL (ref 100–199)
HDL: 53 mg/dL (ref >39)
LDL Calculated: 100 mg/dL — ABNORMAL HIGH (ref 0–99)
Triglycerides: 106 mg/dL (ref 0–149)
VLDL Cholesterol Cal: 21 mg/dL (ref 5–40)

## 2019-02-27 LAB — PSA: Prostate Specific Ag, Serum: 0.2 ng/mL (ref 0.0–4.0)

## 2019-03-25 DIAGNOSIS — Z03818 Encounter for observation for suspected exposure to other biological agents ruled out: Secondary | ICD-10-CM | POA: Diagnosis not present

## 2019-07-22 DIAGNOSIS — H5203 Hypermetropia, bilateral: Secondary | ICD-10-CM | POA: Diagnosis not present

## 2019-09-22 ENCOUNTER — Ambulatory Visit (HOSPITAL_COMMUNITY)
Admission: RE | Admit: 2019-09-22 | Discharge: 2019-09-22 | Disposition: A | Discharge status: Home / Self Care | Payer: Medicare Other | Source: Ambulatory Visit | Attending: Nurse Practitioner | Admitting: Nurse Practitioner

## 2019-09-22 ENCOUNTER — Encounter (HOSPITAL_COMMUNITY): Payer: Self-pay | Admitting: Nurse Practitioner

## 2019-09-22 ENCOUNTER — Other Ambulatory Visit: Payer: Self-pay

## 2019-09-22 VITALS — BP 138/96 | HR 70 | Ht 69.0 in | Wt 179.8 lb

## 2019-09-22 DIAGNOSIS — I480 Paroxysmal atrial fibrillation: Secondary | ICD-10-CM | POA: Insufficient documentation

## 2019-09-22 DIAGNOSIS — Z79899 Other long term (current) drug therapy: Secondary | ICD-10-CM | POA: Insufficient documentation

## 2019-09-22 DIAGNOSIS — Z8249 Family history of ischemic heart disease and other diseases of the circulatory system: Secondary | ICD-10-CM | POA: Insufficient documentation

## 2019-09-22 DIAGNOSIS — Z823 Family history of stroke: Secondary | ICD-10-CM | POA: Insufficient documentation

## 2019-09-22 DIAGNOSIS — E7800 Pure hypercholesterolemia, unspecified: Secondary | ICD-10-CM | POA: Diagnosis not present

## 2019-09-22 DIAGNOSIS — R9431 Abnormal electrocardiogram [ECG] [EKG]: Secondary | ICD-10-CM | POA: Insufficient documentation

## 2019-09-22 MED ORDER — DILTIAZEM HCL 30 MG PO TABS: ORAL_TABLET | ORAL | 2 refills | Status: DC

## 2019-09-22 NOTE — Patient Instructions (Signed)
Take Cardizem 30mg  every 4 hours as needed for HR over 100 as long as B/P on top is above 100.

## 2019-09-22 NOTE — Progress Notes (Signed)
Primary Care Physician: [name], MD Referring Physician: Dr. Wynell Balloon is a 68 y.o. male with a h/o paroxysmal afib, s/p ablation x3, last ablation 11/30/16. I have not seen pt since 2018 as he has been doing well. He was on Cardizem 120 mg daily and xarleto both of which were stopped after last visit here. CHA2DS2VASc is 1.   He noted some racing of the heart Friday and this am. He woke up felt racing and some chest discomfort. Kardia strips appear to show atrial flutter with RVR. He was actually surprised  he was back in rhythm in the office. He took an expired dose of Cardizem 120 mg daily. He has taken flecainide  in the past and states it did not agree with him. He does not really want to go back on Cardizem daily if it can be avoided.  No change in heath, no recent illness. No alcohol, tobacco, snoring.   Today, he denies symptoms of palpitations, chest pain, shortness of breath, orthopnea, PND, lower extremity edema, dizziness, presyncope, syncope, or neurologic sequela. The patient is tolerating medications without difficulties and is otherwise without complaint today.   Past Medical History:  Diagnosis Date  . Atrial fibrillation (HCC)    paroxysmal s/p PVI by JA  . Diverticulosis    Colonoscopy 07/2013  . Herpes   . History of hepatitis B   . Hypercholesterolemia   . Varicose veins    Past Surgical History:  Procedure Laterality Date  . atrail fibrillation ablation  7/10, 1//17/14   PVI by JA  . ATRIAL FIBRILLATION ABLATION N/A 10/29/2012   Procedure: ATRIAL FIBRILLATION ABLATION;  Surgeon: Thompson Grayer, MD;  Location: [facility] CATH LAB;  Service: Cardiovascular;  Laterality: N/A;  . CARDIOVERSION N/A 09/20/2015   Procedure: CARDIOVERSION;  Surgeon: Thayer Headings, MD;  Location: [facility] ENDOSCOPY;  Service: Cardiovascular;  Laterality: N/A;  . CARDIOVERSION N/A 09/28/2015   Procedure: CARDIOVERSION;  Surgeon: Lelon Perla, MD;  Location: [place];   Service: Cardiovascular;  Laterality: N/A;  . COLONOSCOPY  2004  . ELECTROPHYSIOLOGIC STUDY N/A 11/28/2015   3rd AF ablation by Dr Rayann Heman  . SKIN BIOPSY Right 11/03/2018  . TEE WITHOUT CARDIOVERSION  10/29/2012   Procedure: TRANSESOPHAGEAL ECHOCARDIOGRAM (TEE);  Surgeon: Fay Records, MD;  Location[facility] ENDOSCOPY;  Service: Cardiovascular;  Laterality: N/A;  pre ablation  . TEE WITHOUT CARDIOVERSION N/A 09/20/2015   Procedure: TRANSESOPHAGEAL ECHOCARDIOGRAM (TEE);  Surgeon: Thayer Headings, MD;  Location: [place];  Service: Cardiovascular;  Laterality: N/A;  . TONSILLECTOMY     when child    Current Outpatient Medications  Medication Sig Dispense Refill  . acyclovir (ZOVIRAX) 400 MG tablet TAKE 1 TABLET(400 MG) BY MOUTH THREE TIMES DAILY AS NEEDED AS DIRECTED (Patient taking differently: as needed. ) 30 tablet 0  . atorvastatin (LIPITOR) 40 MG tablet TAKE 1 TABLET(40 MG) BY MOUTH DAILY 90 tablet 3  . finasteride (PROSCAR) 5 MG tablet TAKE 1 TABLET(5 MG) BY MOUTH DAILY 90 tablet 3  . meclizine (ANTIVERT) 25 MG tablet Take 25 mg by mouth as directed. Take as needed for lightheadedness/vertigo    . omeprazole (PRILOSEC) 40 MG capsule Take 40 mg by mouth daily as needed (heartburn or acid reflux).    Marland Kitchen diltiazem (CARDIZEM) 30 MG tablet Take one tablet every 4 hours as needed for HR greater than 100 45 tablet 2   No current facility-administered medications for this encounter.  No Known Allergies  Social History   Socioeconomic History  . Marital status: Single    Spouse name: Not on file  . Number of children: Not on file  . Years of education: Not on file  . Highest education level: Not on file  Occupational History  . Occupation: owns yoga studio    Employer: [employer]  . Occupation: bookkeeper  Social Needs  . Financial resource strain: Not on file  . Food insecurity    Worry: Not on file    Inability: Not on file  . Transportation needs    Medical: Not on file     Non-medical: Not on file  Tobacco Use  . Smoking status: Never Smoker  . Smokeless tobacco: Never Used  . Tobacco comment: denies   Substance and Sexual Activity  . Alcohol use: Yes    Alcohol/week: 1.0 standard drinks    Types: 1 Cans of beer per week  . Drug use: No  . Sexual activity: Yes  Lifestyle  . Physical activity    Days per week: Not on file    Minutes per session: Not on file  . Stress: Not on file  Relationships  . Social Herbalist on phone: Not on file    Gets together: Not on file    Attends religious service: Not on file    Active member of club or organization: Not on file    Attends meetings of clubs or organizations: Not on file    Relationship status: Not on file  . Intimate partner violence    Fear of current or ex partner: Not on file    Emotionally abused: Not on file    Physically abused: Not on file    Forced sexual activity: Not on file  Other Topics Concern  . Not on file  Social History Narrative  . Not on file    Family History  Problem Relation Age of Onset  . Hypertension Father   . Hypertension Brother   . Hyperlipidemia Brother   . Stroke Mother     ROS- All systems are reviewed and negative except as per the HPI above  Physical Exam: Vitals:   09/22/19 1516  BP: (!) 138/96  Pulse: 70  SpO2: 98%  Weight: 81.6 kg  Height: 5\' 9"  (1.753 m)   Wt Readings from Last 3 Encounters:  09/22/19 81.6 kg  02/26/19 80.3 kg  02/11/19 80.8 kg    Labs: Lab Results  Component Value Date   NA 141 02/26/2019   K 4.6 02/26/2019   CL 103 02/26/2019   CO2 22 02/26/2019   GLUCOSE 90 02/26/2019   BUN 15 02/26/2019   CREATININE 1.08 02/26/2019   CALCIUM 9.2 02/26/2019   MG 2.5 10/10/2008   Lab Results  Component Value Date   INR 2.3 08/09/2009   Lab Results  Component Value Date   CHOL 174 02/26/2019   HDL 53 02/26/2019   LDLCALC 100 (H) 02/26/2019   TRIG 106 02/26/2019     GEN- The patient is well appearing,  alert and oriented x 3 today.   Head- normocephalic, atraumatic Eyes-  Sclera clear, conjunctiva pink Ears- hearing intact Oropharynx- clear Neck- supple, no JVP Lymph- no cervical lymphadenopathy Lungs- Clear to ausculation bilaterally, normal work of breathing Heart- Regular rate and rhythm, no murmurs, rubs or gallops, PMI not laterally displaced GI- soft, NT, ND, + BS Extremities- no clubbing, cyanosis, or edema MS- no significant deformity or atrophy  Skin- no rash or lesion Psych- euthymic mood, full affect Neuro- strength and sensation are intact  EKG-NSR at 70 bpm, PR int 170 ms, qrs int 82 ms, qtc 423 ms  Kardia strip reviewed   this am,  looked like a flutter with rvr    Assessment and Plan: 1. Paroxysmal  afib  Ablation x 3, last in 2018 and afib has been quiet Recent episode x 2  We discussed prn diltiazem vrs daily cardizem He would prefer prn dilt for now  2. CHA2DS2VASc of 1 Anticaogualtion is not required by guidelines   See back in one week Sooner if increase in afib  Continue to track via [place]. Joie Reamer, [facility] [address] [place], [place] [address] [phone]

## 2019-09-28 DIAGNOSIS — H5203 Hypermetropia, bilateral: Secondary | ICD-10-CM | POA: Diagnosis not present

## 2019-09-29 ENCOUNTER — Encounter (HOSPITAL_COMMUNITY): Payer: Self-pay | Admitting: Nurse Practitioner

## 2019-09-29 ENCOUNTER — Other Ambulatory Visit: Payer: Self-pay

## 2019-09-29 ENCOUNTER — Ambulatory Visit (HOSPITAL_COMMUNITY)
Admission: RE | Admit: 2019-09-29 | Discharge: 2019-09-29 | Disposition: A | Discharge status: Home / Self Care | Payer: Medicare Other | Source: Ambulatory Visit | Attending: Nurse Practitioner | Admitting: Nurse Practitioner

## 2019-09-29 VITALS — BP 130/70 | HR 58 | Ht 69.0 in | Wt 182.4 lb

## 2019-09-29 DIAGNOSIS — I480 Paroxysmal atrial fibrillation: Secondary | ICD-10-CM | POA: Diagnosis not present

## 2019-09-29 DIAGNOSIS — K5790 Diverticulosis of intestine, part unspecified, without perforation or abscess without bleeding: Secondary | ICD-10-CM | POA: Diagnosis not present

## 2019-09-29 DIAGNOSIS — E7800 Pure hypercholesterolemia, unspecified: Secondary | ICD-10-CM | POA: Insufficient documentation

## 2019-09-29 DIAGNOSIS — Z79899 Other long term (current) drug therapy: Secondary | ICD-10-CM | POA: Diagnosis not present

## 2019-09-29 DIAGNOSIS — Z8619 Personal history of other infectious and parasitic diseases: Secondary | ICD-10-CM | POA: Diagnosis not present

## 2019-09-29 DIAGNOSIS — I4892 Unspecified atrial flutter: Secondary | ICD-10-CM | POA: Diagnosis not present

## 2019-09-29 NOTE — Progress Notes (Signed)
Primary Care Physician: [name], MD Referring Physician: Dr. Wynell Balloon is a 68 y.o. male with a h/o paroxysmal afib, s/p ablation x3, last ablation 11/30/16. I have not seen pt since 2018 as he has been doing well. He was on Cardizem 120 mg daily and xarleto both of which were stopped after last visit here. CHA2DS2VASc is 1.   He noted some racing of the heart Friday and this am. He woke up felt racing and some chest discomfort. Kardia strips appear to show atrial flutter with RVR. He was actually surprised  he was back in rhythm in the office. He took an expired dose of Cardizem 120 mg daily. He has taken flecainide  in the past and states it did not agree with him. He does not really want to go back on Cardizem daily if it can be avoided.  No change in heath, no recent illness. No alcohol, tobacco, snoring.   F/u in afib clinic, 12/16. He has not noted any further sustained episodes but at times he feels that his heart rate is getting stirred up like an episode may occur. I discussed with him on last visit benefit of being followed with a Linq monitor and he would like to pursue.   Today, he denies symptoms of palpitations, chest pain, shortness of breath, orthopnea, PND, lower extremity edema, dizziness, presyncope, syncope, or neurologic sequela. The patient is tolerating medications without difficulties and is otherwise without complaint today.   Past Medical History:  Diagnosis Date  . Atrial fibrillation (HCC)    paroxysmal s/p PVI by JA  . Diverticulosis    Colonoscopy 07/2013  . Herpes   . History of hepatitis B   . Hypercholesterolemia   . Varicose veins    Past Surgical History:  Procedure Laterality Date  . atrail fibrillation ablation  7/10, 1//17/14   PVI by JA  . ATRIAL FIBRILLATION ABLATION N/A 10/29/2012   Procedure: ATRIAL FIBRILLATION ABLATION;  Surgeon: Thompson Grayer, MD;  Location: [facility] CATH LAB;  Service: Cardiovascular;  Laterality: N/A;    . CARDIOVERSION N/A 09/20/2015   Procedure: CARDIOVERSION;  Surgeon: Thayer Headings, MD;  Location: [facility] ENDOSCOPY;  Service: Cardiovascular;  Laterality: N/A;  . CARDIOVERSION N/A 09/28/2015   Procedure: CARDIOVERSION;  Surgeon: Lelon Perla, MD;  Location: [place];  Service: Cardiovascular;  Laterality: N/A;  . COLONOSCOPY  2004  . ELECTROPHYSIOLOGIC STUDY N/A 11/28/2015   3rd AF ablation by Dr Rayann Heman  . SKIN BIOPSY Right 11/03/2018  . TEE WITHOUT CARDIOVERSION  10/29/2012   Procedure: TRANSESOPHAGEAL ECHOCARDIOGRAM (TEE);  Surgeon: Fay Records, MD;  Location: [facility] ENDOSCOPY;  Service: Cardiovascular;  Laterality: N/A;  pre ablation  . TEE WITHOUT CARDIOVERSION N/A 09/20/2015   Procedure: TRANSESOPHAGEAL ECHOCARDIOGRAM (TEE);  Surgeon: Thayer Headings, MD;  Location: [place];  Service: Cardiovascular;  Laterality: N/A;  . TONSILLECTOMY     when child    Current Outpatient Medications  Medication Sig Dispense Refill  . acyclovir (ZOVIRAX) 400 MG tablet TAKE 1 TABLET(400 MG) BY MOUTH THREE TIMES DAILY AS NEEDED AS DIRECTED (Patient taking differently: as needed. ) 30 tablet 0  . atorvastatin (LIPITOR) 40 MG tablet TAKE 1 TABLET(40 MG) BY MOUTH DAILY 90 tablet 3  . diltiazem (CARDIZEM) 30 MG tablet Take one tablet every 4 hours as needed for HR greater than 100 45 tablet 2  . finasteride (PROSCAR) 5 MG tablet TAKE 1 TABLET(5 MG) BY MOUTH DAILY 90 tablet  3  . meclizine (ANTIVERT) 25 MG tablet Take 25 mg by mouth as directed. Take as needed for lightheadedness/vertigo    . omeprazole (PRILOSEC) 40 MG capsule Take 40 mg by mouth daily as needed (heartburn or acid reflux).     No current facility-administered medications for this encounter.    No Known Allergies  Social History   Socioeconomic History  . Marital status: Single    Spouse name: Not on file  . Number of children: Not on file  . Years of education: Not on file  . Highest education level: Not on file   Occupational History  . Occupation: owns yoga studio    Employer: [employer]  . Occupation: bookkeeper  Tobacco Use  . Smoking status: Never Smoker  . Smokeless tobacco: Never Used  . Tobacco comment: denies   Substance and Sexual Activity  . Alcohol use: Yes    Alcohol/week: 1.0 standard drinks    Types: 1 Cans of beer per week  . Drug use: No  . Sexual activity: Yes  Other Topics Concern  . Not on file  Social History Narrative  . Not on file   Social Determinants of Health   Financial Resource Strain:   . Difficulty of Paying Living Expenses: Not on file  Food Insecurity:   . Worried About Charity fundraiser in the Last Year: Not on file  . Ran Out of Food in the Last Year: Not on file  Transportation Needs:   . Lack of Transportation (Medical): Not on file  . Lack of Transportation (Non-Medical): Not on file  Physical Activity:   . Days of Exercise per Week: Not on file  . Minutes of Exercise per Session: Not on file  Stress:   . Feeling of Stress : Not on file  Social Connections:   . Frequency of Communication with Friends and Family: Not on file  . Frequency of Social Gatherings with Friends and Family: Not on file  . Attends Religious Services: Not on file  . Active Member of Clubs or Organizations: Not on file  . Attends Archivist Meetings: Not on file  . Marital Status: Not on file  Intimate Partner Violence:   . Fear of Current or Ex-Partner: Not on file  . Emotionally Abused: Not on file  . Physically Abused: Not on file  . Sexually Abused: Not on file    Family History  Problem Relation Age of Onset  . Hypertension Father   . Hypertension Brother   . Hyperlipidemia Brother   . Stroke Mother     ROS- All systems are reviewed and negative except as per the HPI above  Physical Exam: Vitals:   09/29/19 1010  BP: 130/70  Pulse: (!) 58  Weight: 82.7 kg  Height: 5\' 9"  (1.753 m)   Wt Readings from Last 3 Encounters:   09/29/19 82.7 kg  09/22/19 81.6 kg  02/26/19 80.3 kg    Labs: Lab Results  Component Value Date   NA 141 02/26/2019   K 4.6 02/26/2019   CL 103 02/26/2019   CO2 22 02/26/2019   GLUCOSE 90 02/26/2019   BUN 15 02/26/2019   CREATININE 1.08 02/26/2019   CALCIUM 9.2 02/26/2019   MG 2.5 10/10/2008   Lab Results  Component Value Date   INR 2.3 08/09/2009   Lab Results  Component Value Date   CHOL 174 02/26/2019   HDL 53 02/26/2019   LDLCALC 100 (H) 02/26/2019   TRIG 106  02/26/2019     GEN- The patient is well appearing, alert and oriented x 3 today.   Head- normocephalic, atraumatic Eyes-  Sclera clear, conjunctiva pink Ears- hearing intact Oropharynx- clear Neck- supple, no JVP Lymph- no cervical lymphadenopathy Lungs- Clear to ausculation bilaterally, normal work of breathing Heart- Regular rate and rhythm, no murmurs, rubs or gallops, PMI not laterally displaced GI- soft, NT, ND, + BS Extremities- no clubbing, cyanosis, or edema MS- no significant deformity or atrophy Skin- no rash or lesion Psych- euthymic mood, full affect Neuro- strength and sensation are intact  EKG- sinus brady at 58 bpm, pr int 174 ms, qrs int 84 ms, qtc 416 ms.   Assessment and Plan: 1. Paroxysmal  afib  Ablation x 3, last in 2018 and afib has been quiet Recent episode x 2  Now quiet again since last visit We discussed prn diltiazem vrs daily cardizem He  preferred prn dilt for now He has a Jodelle Red but would  like to pursue getting a LInq Will send an Epic message to that effect to Dr. Jackalyn Lombard office   2. CHA2DS2VASc of 1 Anticaogualtion is not required by guidelines   F/u with Dr. Jackalyn Lombard  office  afib clinic as needed   Geroge Baseman. Ravinder Hofland, [facility] [address] [place], [place] [address] [phone]

## 2019-10-10 ENCOUNTER — Other Ambulatory Visit (HOSPITAL_COMMUNITY): Payer: Self-pay | Admitting: Nurse Practitioner

## 2019-10-11 ENCOUNTER — Encounter: Payer: Self-pay | Admitting: Internal Medicine

## 2019-10-11 ENCOUNTER — Ambulatory Visit: Payer: Medicare Other | Admitting: Internal Medicine

## 2019-10-11 ENCOUNTER — Other Ambulatory Visit: Payer: Self-pay

## 2019-10-11 VITALS — BP 144/82 | HR 60 | Ht 69.0 in | Wt 182.2 lb

## 2019-10-11 DIAGNOSIS — E7800 Pure hypercholesterolemia, unspecified: Secondary | ICD-10-CM

## 2019-10-11 DIAGNOSIS — I480 Paroxysmal atrial fibrillation: Secondary | ICD-10-CM | POA: Diagnosis not present

## 2019-10-11 HISTORY — PX: OTHER SURGICAL HISTORY: SHX169

## 2019-10-11 NOTE — Patient Instructions (Addendum)
Medication Instructions:  Your physician recommends that you continue on your current medications as directed. Please refer to the Current Medication list given to you today.  Labwork: None ordered.  Testing/Procedures: None ordered.  Follow-Up:  You will have a virtual visit with device clinic for a wound check.    October 21, 2019 at 3:30pm  Your physician wants you to follow-up in:  4 months with Dr Rayann Heman.    February 07, 2020 at 215pm (Virtual Visit)   Any Other Special Instructions Will Be Listed Below (If Applicable).  If you need a refill on your cardiac medications before your next appointment, please call your pharmacy.    Implantable Loop Recorder Placement, Care After This sheet gives you information about how to care for yourself after your procedure. Your health care provider may also give you more specific instructions. If you have problems or questions, contact your health care provider. What can I expect after the procedure? After the procedure, it is common to have:  Soreness or discomfort near the incision.  Some swelling or bruising near the incision. Follow these instructions at home: Incision care   Follow instructions from your health care provider about how to take care of your incision. Make sure you: ? Leave your outer dressing on for 48 hours.  After 48 hours you can remove that and shower. ? Leave adhesive strips in place. These skin closures may need to stay in place for 2 weeks or longer. If adhesive strip edges start to loosen and curl up, you may trim the loose edges. Do not remove adhesive strips completely unless your health care provider tells you to do that.  Check your incision area every day for signs of infection. Check for: ? Redness, swelling, or pain. ? Fluid or blood. ? Warmth. ? Pus or a bad smell. Do not take baths, swim, or use a hot tub until your incision is completely healed.  Activity  Return to your normal  activities.  General instructions  Follow instructions from your health care provider about how to manage your implantable loop recorder and transmit the information. Learn how to activate a recording if this is necessary for your type of device.  Do not go through a metal detection gate, and do not let someone hold a metal detector over your chest. Show your ID card.  Do not have an MRI unless you check with your health care provider first.  Take over-the-counter and prescription medicines only as told by your health care provider.  Keep all follow-up visits as told by your health care provider. This is important. Contact a health care provider if:  You have redness, swelling, or pain around your incision.  You have a fever.  You have pain that is not relieved by your pain medicine.  You have triggered your device because of fainting (syncope) or because of a heartbeat that feels like it is racing, slow, fluttering, or skipping (palpitations). Get help right away if you have:  Chest pain.  Difficulty breathing. Summary  After the procedure, it is common to have soreness or discomfort near the incision.  Change your dressing as told by your health care provider.  Follow instructions from your health care provider about how to manage your implantable loop recorder and transmit the information.  Keep all follow-up visits as told by your health care provider. This is important. This information is not intended to replace advice given to you by your health care provider. Make sure you  discuss any questions you have with your health care provider. Document Released: 09/11/2015 Document Revised: 11/15/2017 Document Reviewed: 11/15/2017 Elsevier Patient Education  2020 [employer].

## 2019-10-11 NOTE — Progress Notes (Signed)
PCP: Maurice Lung, MD   Primary EP: Maurice Maurice [name] is a 68 y.o. male who presents today for routine electrophysiology followup.  Since last being seen in our clinic, the patient reports doing very well.  He has rare palpitations and is concerned for afib as the cause.  Today, he denies symptoms of chest pain, shortness of breath,  lower extremity edema, dizziness, presyncope, or syncope.  The patient is otherwise without complaint today.   Past Medical History:  Diagnosis Date  . Atrial fibrillation (HCC)    paroxysmal s/p PVI by Maurice [name]  . Diverticulosis    Colonoscopy 07/2013  . Herpes   . History of hepatitis B   . Hypercholesterolemia   . Varicose veins    Past Surgical History:  Procedure Laterality Date  . atrail fibrillation ablation  7/10, 1//17/14   PVI by Maurice [name]  . ATRIAL FIBRILLATION ABLATION N/A 10/29/2012   Procedure: ATRIAL FIBRILLATION ABLATION;  Surgeon: Maurice Grayer, MD;  Location: [facility] CATH LAB;  Service: Cardiovascular;  Laterality: N/A;  . CARDIOVERSION N/A 09/20/2015   Procedure: CARDIOVERSION;  Surgeon: Maurice Headings, MD;  Location: [facility] ENDOSCOPY;  Service: Cardiovascular;  Laterality: N/A;  . CARDIOVERSION N/A 09/28/2015   Procedure: CARDIOVERSION;  Surgeon: Maurice Perla, MD;  Location: [place];  Service: Cardiovascular;  Laterality: N/A;  . COLONOSCOPY  2004  . ELECTROPHYSIOLOGIC STUDY N/A 11/28/2015   3rd AF ablation by Maurice [name]  . SKIN BIOPSY Right 11/03/2018  . TEE WITHOUT CARDIOVERSION  10/29/2012   Procedure: TRANSESOPHAGEAL ECHOCARDIOGRAM (TEE);  Surgeon: Maurice Records, MD;  Location: [facility] ENDOSCOPY;  Service: Cardiovascular;  Laterality: N/A;  pre ablation  . TEE WITHOUT CARDIOVERSION N/A 09/20/2015   Procedure: TRANSESOPHAGEAL ECHOCARDIOGRAM (TEE);  Surgeon: Maurice Headings, MD;  Location: [facility] ENDOSCOPY;  Service: Cardiovascular;  Laterality: N/A;  . TONSILLECTOMY     when child    ROS- all systems are reviewed and negatives except  as per HPI above  Current Outpatient Medications  Medication Sig Dispense Refill  . acyclovir (ZOVIRAX) 400 MG tablet TAKE 1 TABLET(400 MG) BY MOUTH THREE TIMES DAILY AS NEEDED AS DIRECTED (Patient taking differently: as needed. ) 30 tablet 0  . atorvastatin (LIPITOR) 40 MG tablet TAKE 1 TABLET(40 MG) BY MOUTH DAILY 90 tablet 3  . diltiazem (CARDIZEM) 30 MG tablet Take one tablet every 4 hours as needed for HR greater than 100 45 tablet 2  . finasteride (PROSCAR) 5 MG tablet TAKE 1 TABLET(5 MG) BY MOUTH DAILY 90 tablet 3  . meclizine (ANTIVERT) 25 MG tablet Take 25 mg by mouth as directed. Take as needed for lightheadedness/vertigo    . omeprazole (PRILOSEC) 40 MG capsule Take 40 mg by mouth daily as needed (heartburn or acid reflux).     No current facility-administered medications for this visit.    Physical Exam: Vitals:   10/11/19 0936  BP: (!) 144/82  Pulse: 60  SpO2: 97%  Weight: 182 lb 3.2 oz (82.6 kg)  Height: 5\' 9"  (1.753 m)    GEN- The patient is well appearing, alert and oriented x 3 today.   Head- normocephalic, atraumatic Eyes-  Sclera clear, conjunctiva pink Ears- hearing intact Oropharynx- clear Lungs- Clear to ausculation bilaterally, normal work of breathing Heart- Regular rate and rhythm, no murmurs, rubs or gallops, PMI not laterally displaced GI- soft, NT, ND, + BS Extremities- no clubbing, cyanosis, or edema  Wt Readings from Last 3 Encounters:  10/11/19 182 lb  3.2 oz (82.6 kg)  09/29/19 182 lb 6.4 oz (82.7 kg)  09/22/19 179 lb 12.8 oz (81.6 kg)    EKG tracing ordered today is personally reviewed and shows sinus rhythm with baseline artifact  Assessment and Plan:  1. Paroxysmal atrial fibrillation He has done very well s/p afib ablation x3 off AAD therapy He has recently developed recurrent palpitations concerning for afib.   I would advise implantation of a loop recorder for afib management post ablation. Risks and benefits to the procedure were  discussed with the patient who wishes to proceed. chads2vasc score is 1.  He does not require anticoagulation at this time.   Maurice Grayer MD, [facility] 10/11/2019 10:25 AM      DESCRIPTION OF PROCEDURE:  Informed written consent was obtained.  The patient required no sedation for the procedure today.  Mapping over the patient's chest was performed to identify the area where electrograms were most prominent for ILR recording.  This area was found to be the left parasternal region over the 3rd-4th intercostal space. The patients left chest was therefore prepped and draped in the usual sterile fashion. The skin overlying the left parasternal region was infiltrated with lidocaine for local analgesia.  A 0.5-cm incision was made over the left parasternal region over the 3rd intercostal space.  A subcutaneous ILR pocket was fashioned using a combination of sharp and blunt dissection.  A Medtronic Reveal Linq model [id number] (SN [id number]) implantable loop recorder was then placed into the pocket  R waves were very prominent and measured >0.35mV.  Steri- Strips and a sterile dressing were then applied.  There were no early apparent complications.     CONCLUSIONS:   1. Successful implantation of a Medtronic Reveal LINQ implantable loop recorder for palpitations and recurrent symptoms of atrial fibrillation post ablation  2. No early apparent complications.   Maurice Grayer MD, [facility] 10/11/2019 10:28 AM

## 2019-10-12 ENCOUNTER — Other Ambulatory Visit: Payer: Self-pay

## 2019-10-21 ENCOUNTER — Telehealth (INDEPENDENT_AMBULATORY_CARE_PROVIDER_SITE_OTHER): Payer: Medicare Other | Admitting: *Deleted

## 2019-10-21 DIAGNOSIS — Z95818 Presence of other cardiac implants and grafts: Secondary | ICD-10-CM

## 2019-10-21 DIAGNOSIS — I480 Paroxysmal atrial fibrillation: Secondary | ICD-10-CM

## 2019-10-22 LAB — CUP PACEART INCLINIC DEVICE CHECK
Date Time Interrogation Session: 20210107181005
Implantable Pulse Generator Implant Date: 20201228

## 2019-10-22 NOTE — Progress Notes (Signed)
ILR wound check via MyChart visit. Steri strips removed by patient. Wound appears well healed. Carelink app transmitting nightly. 9 AF episodes (6.4% burden), all from 10/11/19 (day of implant), longest 14hrs, not on [place] as CHADS2VASC score is 1. Patient reports he noticed palpitations during episodes that day but has been doing well since. Patient education provided. Monthly summary reports and ROV with Dr. Rayann Heman on 02/07/20.  AF episode example included below as unable to attach strips to encounter as PDF file.

## 2019-10-25 ENCOUNTER — Ambulatory Visit: Payer: Medicare Other | Admitting: Internal Medicine

## 2019-10-26 ENCOUNTER — Ambulatory Visit: Payer: Medicare Other

## 2019-11-12 ENCOUNTER — Ambulatory Visit (INDEPENDENT_AMBULATORY_CARE_PROVIDER_SITE_OTHER): Payer: Medicare Other | Admitting: *Deleted

## 2019-11-12 DIAGNOSIS — I480 Paroxysmal atrial fibrillation: Secondary | ICD-10-CM | POA: Diagnosis not present

## 2019-11-14 ENCOUNTER — Ambulatory Visit: Payer: Medicare Other

## 2019-11-14 LAB — CUP PACEART REMOTE DEVICE CHECK
Date Time Interrogation Session: 20210130110646
Implantable Pulse Generator Implant Date: 20201228

## 2019-11-15 LAB — CUP PACEART REMOTE DEVICE CHECK
Date Time Interrogation Session: 20210127014041
Implantable Pulse Generator Implant Date: 20201228

## 2019-11-18 DIAGNOSIS — L570 Actinic keratosis: Secondary | ICD-10-CM | POA: Diagnosis not present

## 2019-11-18 DIAGNOSIS — L821 Other seborrheic keratosis: Secondary | ICD-10-CM | POA: Diagnosis not present

## 2019-11-18 DIAGNOSIS — D229 Melanocytic nevi, unspecified: Secondary | ICD-10-CM | POA: Diagnosis not present

## 2019-11-20 ENCOUNTER — Ambulatory Visit: Payer: Medicare Other

## 2019-12-05 ENCOUNTER — Ambulatory Visit: Payer: Medicare Other

## 2019-12-06 ENCOUNTER — Telehealth: Payer: Self-pay

## 2019-12-06 NOTE — Telephone Encounter (Signed)
Continue to follow  Ok to adjust alert to 6 hour episodes.

## 2019-12-06 NOTE — Telephone Encounter (Signed)
Pt with known history PAF,  Linq II implanted due to report of palpitations s/p ablation.  Pt does not qualify for [place] due to CHADS Vasc of 1.  Meds include PRN Diltiazem 30mg .    Alert received for 4 recent AF events, all during nocturnal hours.  The longest being 3.26 hours with VR up to 150's.  Spoke with pt, he was sleeping at time and unaware.  Pt reports no current cardiac complaints.    Advised will forward to MD, most likely continue to monitor as this is known issue.

## 2019-12-07 NOTE — Telephone Encounter (Signed)
Alerts updated in carelink.

## 2019-12-13 ENCOUNTER — Ambulatory Visit (INDEPENDENT_AMBULATORY_CARE_PROVIDER_SITE_OTHER): Payer: Medicare Other | Admitting: *Deleted

## 2019-12-13 DIAGNOSIS — I480 Paroxysmal atrial fibrillation: Secondary | ICD-10-CM

## 2019-12-14 LAB — CUP PACEART REMOTE DEVICE CHECK
Date Time Interrogation Session: 20210302110530
Implantable Pulse Generator Implant Date: 20201228

## 2019-12-15 NOTE — Progress Notes (Signed)
ILR Remote 

## 2019-12-20 ENCOUNTER — Other Ambulatory Visit: Payer: Self-pay | Admitting: Family Medicine

## 2019-12-20 ENCOUNTER — Telehealth: Payer: Self-pay | Admitting: Emergency Medicine

## 2019-12-20 DIAGNOSIS — E785 Hyperlipidemia, unspecified: Secondary | ICD-10-CM

## 2019-12-20 DIAGNOSIS — L649 Androgenic alopecia, unspecified: Secondary | ICD-10-CM

## 2019-12-20 NOTE — Telephone Encounter (Signed)
Patient had alert for increased AF episodes recorded by LINQ II. Patient has known PAF., no OAC due to CHADSVASC score of 1. Patient has felt intermittent palpitations and on 12/13/19 took prn dose of diltiazem 30 mg po as ordered which stopped the episode., but denies CP, SOB , dizziness or syncope. AF Burden increased from 2.7%  to 6.1% , longest episode was 9 hrs and 36 minutes  and occurred nocturnally at 3:19 am.

## 2019-12-31 ENCOUNTER — Telehealth: Payer: Self-pay

## 2019-12-31 NOTE — Telephone Encounter (Signed)
Linq alert received- Pt in ongoinAF controlled VRates.  AF burden 5.6% with 1 symptomatic episode on 12/22/19 EGM reflects AF with RVR.  Pt has known hx of PAF, not on [place] due to CHADs Vasc of 1. Current meds include PRN Diltiazem 30mg  Q4hrs as needed for HR >100.   Spoke with pt, he reports he has felt like he was back in AF as of yesterday, this morning he took his PRN Cartia and already is feeling better.  Pt very aware of changes in HR and will continue to monitor.  At thissm time he declines new f/u with AF clinic and states if he feels it is not being controlled he will reach out to them.

## 2020-01-03 ENCOUNTER — Telehealth: Payer: Self-pay

## 2020-01-03 NOTE — Telephone Encounter (Signed)
Linq alert-  Increased AF/AT burden, long AF with RVR episode 12/31/19 lasting 17 hours.   Spoke with pt, he was again aware of this episode, reporting that he knew he was in AF when he went to bed and awoke feeling better.  Pt did take PRN dosage of cartia 30mg .  Pt has history of PAF, not on OAC due to low CHADS Vasc. Current Alert programing is for notification of episodes >6hours, forwarding to MD to determine if this can be extended.

## 2020-01-12 ENCOUNTER — Telehealth: Payer: Self-pay | Admitting: Emergency Medicine

## 2020-01-12 NOTE — Telephone Encounter (Addendum)
Patient contacted due to 5 episodes of AF recorded That appear to be AF Verses SVT. Recorded  by LINQ II on 01/11/20. The longest episode was 5 hours and 14 minutes and started at [place] . Patient reports he felt his HR increase periodically through out the day 01/11/20 but did ot take Cardizem prn and HR settled down after lunch. No CP , no SOB, no dizziness, no syncope during episodes 01/11/20.CHADvasc score 1 no OAC. Will have video visit with Dr Rayann Heman 02/07/20.

## 2020-01-13 ENCOUNTER — Ambulatory Visit (INDEPENDENT_AMBULATORY_CARE_PROVIDER_SITE_OTHER): Payer: Medicare Other | Admitting: *Deleted

## 2020-01-13 DIAGNOSIS — I480 Paroxysmal atrial fibrillation: Secondary | ICD-10-CM | POA: Diagnosis not present

## 2020-01-16 LAB — CUP PACEART REMOTE DEVICE CHECK
Date Time Interrogation Session: 20210402110635
Implantable Pulse Generator Implant Date: 20201228

## 2020-01-16 NOTE — Telephone Encounter (Signed)
Lets keep his programming "as is". Continue to monitor.  Does not need a phone call for episodes that self terminate.

## 2020-01-17 NOTE — Progress Notes (Signed)
ILR Remote 

## 2020-01-18 ENCOUNTER — Other Ambulatory Visit: Payer: Self-pay | Admitting: Family Medicine

## 2020-01-18 DIAGNOSIS — Z8619 Personal history of other infectious and parasitic diseases: Secondary | ICD-10-CM

## 2020-01-19 NOTE — Telephone Encounter (Signed)
[facility] is requesting to fill pt acyclovir. Please advise KH 

## 2020-02-07 ENCOUNTER — Encounter: Payer: Self-pay | Admitting: Internal Medicine

## 2020-02-07 ENCOUNTER — Other Ambulatory Visit: Payer: Self-pay

## 2020-02-07 ENCOUNTER — Telehealth: Payer: Self-pay

## 2020-02-07 ENCOUNTER — Telehealth (INDEPENDENT_AMBULATORY_CARE_PROVIDER_SITE_OTHER): Payer: Medicare Other | Admitting: Internal Medicine

## 2020-02-07 DIAGNOSIS — I480 Paroxysmal atrial fibrillation: Secondary | ICD-10-CM

## 2020-02-07 DIAGNOSIS — I4891 Unspecified atrial fibrillation: Secondary | ICD-10-CM

## 2020-02-07 MED ORDER — METOPROLOL TARTRATE 50 MG PO TABS: ORAL_TABLET | ORAL | 0 refills | Status: DC

## 2020-02-07 MED ORDER — RIVAROXABAN 20 MG PO TABS: 20.0000 mg | ORAL_TABLET | Freq: Every day | ORAL | 11 refills | Status: DC

## 2020-02-07 NOTE — Progress Notes (Signed)
Electrophysiology TeleHealth Note   Due to national recommendations of social distancing due to COVID 19, an audio/video telehealth visit is felt to be most appropriate for this patient at this time.  See MyChart message from today for the patient's consent to telehealth for Maurice [name].  Date:  02/07/2020   ID:  Maurice [name], DOB April 27, 1951, MRN [id number]  Location: patient's home  Provider location:  [place]  Evaluation Performed: Follow-up visit  PCP:  [name], MD   Electrophysiologist:  Dr Rayann Heman  Chief Complaint:  palpitations  History of Present Illness:    Maurice [name] is a 69 y.o. male who presents via telehealth conferencing today.  Since last being seen in our clinic, the patient reports doing very well.  He continues to have afib episodes.  He feels that his afib has increased in frequency and duration. Today, he denies symptoms of chest pain, shortness of breath,  lower extremity edema, dizziness, presyncope, or syncope.  The patient is otherwise without complaint today.   Past Medical History:  Diagnosis Date  . Atrial fibrillation (HCC)    paroxysmal s/p PVI by JA  . Diverticulosis    Colonoscopy 07/2013  . Herpes   . History of hepatitis B   . Hypercholesterolemia   . Varicose veins     Past Surgical History:  Procedure Laterality Date  . atrail fibrillation ablation  7/10, 1//17/14   PVI by JA  . ATRIAL FIBRILLATION ABLATION N/A 10/29/2012   Procedure: ATRIAL FIBRILLATION ABLATION;  Surgeon: Thompson Grayer, MD;  Location: [facility] CATH LAB;  Service: Cardiovascular;  Laterality: N/A;  . CARDIOVERSION N/A 09/20/2015   Procedure: CARDIOVERSION;  Surgeon: Thayer Headings, MD;  Location: [facility] ENDOSCOPY;  Service: Cardiovascular;  Laterality: N/A;  . CARDIOVERSION N/A 09/28/2015   Procedure: CARDIOVERSION;  Surgeon: Lelon Perla, MD;  Location: [place];  Service: Cardiovascular;  Laterality: N/A;  . COLONOSCOPY  2004  .  ELECTROPHYSIOLOGIC STUDY N/A 11/28/2015   3rd AF ablation by Dr Rayann Heman  . implantable loop recorder placement  10/11/2019   Medtronic Reveal Linq model LNQ22 (SN RLB [id number]) by Dr Rayann Heman for afib management post ablation  . SKIN BIOPSY Right 11/03/2018  . TEE WITHOUT CARDIOVERSION  10/29/2012   Procedure: TRANSESOPHAGEAL ECHOCARDIOGRAM (TEE);  Surgeon: Fay Records, MD;  Location: [name] ENDOSCOPY;  Service: Cardiovascular;  Laterality: N/A;  pre ablation  . TEE WITHOUT CARDIOVERSION N/A 09/20/2015   Procedure: TRANSESOPHAGEAL ECHOCARDIOGRAM (TEE);  Surgeon: Thayer Headings, MD;  Location: [place];  Service: Cardiovascular;  Laterality: N/A;  . TONSILLECTOMY     when child    Current Outpatient Medications  Medication Sig Dispense Refill  . acyclovir (ZOVIRAX) 400 MG tablet TAKE 1 TABLET BY MOUTH THREE TIMES DAILY AS NEEDED AS DIRECTED 30 tablet 0  . atorvastatin (LIPITOR) 40 MG tablet TAKE 1 TABLET(40 MG) BY MOUTH DAILY 90 tablet 3  . diltiazem (CARDIZEM) 30 MG tablet TAKE 1 TABLET BY MOUTH EVERY 4 HOURS AS NEEDED FOR HR GREATER THAN 100 45 tablet 2  . finasteride (PROSCAR) 5 MG tablet TAKE 1 TABLET(5 MG) BY MOUTH DAILY 90 tablet 3  . meclizine (ANTIVERT) 25 MG tablet Take 25 mg by mouth as directed. Take as needed for lightheadedness/vertigo    . omeprazole (PRILOSEC) 40 MG capsule Take 40 mg by mouth daily as needed (heartburn or acid reflux).     No current facility-administered medications for this visit.    Allergies:  Patient has no known allergies.   Social History:  The patient  reports that he has never smoked. He has never used smokeless tobacco. He reports current alcohol use of about 1.0 standard drinks of alcohol per week. He reports that he does not use drugs.   ROS:  Please see the history of present illness.   All other systems are personally reviewed and negative.    Exam:    Vital Signs:  There were no vitals taken for this visit.  Well sounding and appearing,  alert and conversant, regular work of breathing,  good skin color Eyes- anicteric, neuro- grossly intact, skin- no apparent rash or lesions or cyanosis, mouth- oral mucosa is pink  Labs/Other Tests and Data Reviewed:    Recent Labs: 02/26/2019: ALT 42; BUN 15; Creatinine, Ser 1.08; Hemoglobin 16.8; Platelets 206; Potassium 4.6; Sodium 141   Wt Readings from Last 3 Encounters:  10/11/19 182 lb 3.2 oz (82.6 kg)  09/29/19 182 lb 6.4 oz (82.7 kg)  09/22/19 179 lb 12.8 oz (81.6 kg)     Last device remote is reviewed from [place] PDF which reveals normal device function, afib burden is 10%   ASSESSMENT & PLAN:    1.  Paroxysmal atrial fibrillation The patient has symptomatic, recurrent paroxysmal atrial fibrillation. he has failed medical therapy with flecainide.  He felt "horrible" with this medicine. Chads2vasc score is 1.   Therapeutic strategies for afib including medicine (multaq, flecainide, propafenone, tikosyn) and ablation were discussed in detail with the patient today. Risk, benefits, and alternatives to EP study and radiofrequency ablation for afib were also discussed in detail today. These risks include but are not limited to stroke, bleeding, vascular damage, tamponade, perforation, damage to the esophagus, lungs, and other structures, pulmonary vein stenosis, worsening renal function, and death. The patient understands these risk and wishes to proceed.  We will therefore proceed with catheter ablation at the next available time.  Carto, ICE, anesthesia are requested for the procedure.  Will also obtain cardiac CT prior to the procedure to exclude LAA thrombus and further evaluate atrial anatomy. Will need to start xarelto 3 weeks prior to ablation. 2D echo to evaluate for structural changes related to his afib   Patient Risk:  after full review of this patients clinical status, I feel that they are at moderate risk at this time.  Today, I have spent 15 minutes with the patient  with telehealth technology discussing arrhythmia management .    SignedThompson Grayer, MD  02/07/2020 2:19 PM     [facility] [address] [place] [address] [phone] (office) [phone] (fax)

## 2020-02-07 NOTE — Patient Instructions (Addendum)
Medication Instructions:  Your physician has recommended you make the following change in your medication:   Begin Xarelto 02/28/2020 as directed.  Confirmed pt's pharmacy as correct in [place].  *If you need a refill on your cardiac medications before your next appointment, please call your pharmacy*   Lab Work: CBC and BMET pre-procedure  If you have labs (blood work) drawn today and your tests are completely normal, you will receive your results only by: Marland Kitchen MyChart Message (if you have MyChart) OR . A paper copy in the mail If you have any lab test that is abnormal or we need to change your treatment, we will call you to review the results.   Testing/Procedures: Your physician has recommended that you have an ablation. Catheter ablation is a medical procedure used to treat some cardiac arrhythmias (irregular heartbeats). During catheter ablation, a long, thin, flexible tube is put into a blood vessel in your groin (upper thigh), or neck. This tube is called an ablation catheter. It is then guided to your heart through the blood vessel. Radio frequency waves destroy small areas of heart tissue where abnormal heartbeats may cause an arrhythmia to start. Please see the instruction sheet given to you today.  Your physician has requested that you have cardiac CT. Cardiac computed tomography (CT) is a painless test that uses an x-ray machine to take clear, detailed pictures of your heart. For further information please visit [email or web address].[email or web address]. Please follow instruction sheet as given.  Your physician has requested that you have an echocardiogram. Echocardiography is a painless test that uses sound waves to create images of your heart. It provides your doctor with information about the size and shape of your heart and how well your heart's chambers and valves are working. This procedure takes approximately one hour. There are no restrictions for this procedure.  Follow-Up: At [facility], you  and your health needs are our priority.  As part of our continuing mission to provide you with exceptional heart care, we have created designated Provider Care Teams.  These Care Teams include your primary Cardiologist (physician) and Advanced Practice Providers (APPs -  Physician Assistants and Nurse Practitioners) who all work together to provide you with the care you need, when you need it.  We recommend signing up for the patient portal called "MyChart".  Sign up information is provided on this After Visit Summary.  MyChart is used to connect with patients for Virtual Visits (Telemedicine).  Patients are able to view lab/test results, encounter notes, upcoming appointments, etc.  Non-urgent messages can be sent to your provider as well.   To learn more about what you can do with MyChart, go to [email or web address].    Your next appointment:   Follow up as scheduled per Dr Rayann Heman  Provider:   Dr Rayann Heman   Other Instructions Instructions reviewed with pt. via phone call.  Willeen Cass, RN, Dr Jackalyn [place]ard nurse will review ablation instructions and follow up with pt at 02/21/2020 lab and echo appointment.  Pt verbalizes understanding and is agreeable with current plan.

## 2020-02-07 NOTE — Telephone Encounter (Signed)
-----   Message from Thompson Grayer, MD sent at 02/07/2020  2:31 PM EDT ----- 2D echo to evaluate afib prior to ablation  Afib ablation Carto/ICE/anesthesia  Cardiac CT  Will need to start xarelto 3 weeks prior

## 2020-02-08 ENCOUNTER — Other Ambulatory Visit: Payer: Self-pay | Admitting: Internal Medicine

## 2020-02-08 NOTE — Telephone Encounter (Signed)
Pt is scheduled for afib ablation on March 21, 2020  Will start Xarelto on Feb 28, 2020  Pt getting ECHO/labs on May 10  Work up complete.

## 2020-02-14 ENCOUNTER — Ambulatory Visit (INDEPENDENT_AMBULATORY_CARE_PROVIDER_SITE_OTHER): Payer: Medicare Other | Admitting: *Deleted

## 2020-02-14 DIAGNOSIS — I480 Paroxysmal atrial fibrillation: Secondary | ICD-10-CM | POA: Diagnosis not present

## 2020-02-14 LAB — CUP PACEART REMOTE DEVICE CHECK
Date Time Interrogation Session: 20210503110742
Implantable Pulse Generator Implant Date: 20201228

## 2020-02-15 NOTE — Progress Notes (Signed)
Carelink Summary Report / Loop Recorder 

## 2020-02-21 ENCOUNTER — Other Ambulatory Visit: Payer: Self-pay

## 2020-02-21 ENCOUNTER — Other Ambulatory Visit: Payer: Medicare Other | Admitting: *Deleted

## 2020-02-21 ENCOUNTER — Ambulatory Visit (HOSPITAL_COMMUNITY): Payer: Medicare Other | Attending: Cardiology

## 2020-02-21 DIAGNOSIS — I480 Paroxysmal atrial fibrillation: Secondary | ICD-10-CM | POA: Diagnosis not present

## 2020-02-21 DIAGNOSIS — I4891 Unspecified atrial fibrillation: Secondary | ICD-10-CM | POA: Diagnosis not present

## 2020-02-21 LAB — CBC WITH DIFFERENTIAL/PLATELET
Basophils Absolute: 0 10*3/uL (ref 0.0–0.2)
Basos: 1 %
EOS (ABSOLUTE): 0.1 10*3/uL (ref 0.0–0.4)
Eos: 2 %
Hematocrit: 44.8 % (ref 37.5–51.0)
Hemoglobin: 15.7 g/dL (ref 13.0–17.7)
Immature Grans (Abs): 0 10*3/uL (ref 0.0–0.1)
Immature Granulocytes: 0 %
Lymphocytes Absolute: 2.3 10*3/uL (ref 0.7–3.1)
Lymphs: 39 %
MCH: 30.5 pg (ref 26.6–33.0)
MCHC: 35 g/dL (ref 31.5–35.7)
MCV: 87 fL (ref 79–97)
Monocytes Absolute: 0.7 10*3/uL (ref 0.1–0.9)
Monocytes: 11 %
Neutrophils Absolute: 2.8 10*3/uL (ref 1.4–7.0)
Neutrophils: 47 %
Platelets: 192 10*3/uL (ref 150–450)
RBC: 5.15 x10E6/uL (ref 4.14–5.80)
RDW: 12.4 % (ref 11.6–15.4)
WBC: 6 10*3/uL (ref 3.4–10.8)

## 2020-02-21 LAB — BASIC METABOLIC PANEL
BUN/Creatinine Ratio: 16 (ref 10–24)
BUN: 16 mg/dL (ref 8–27)
CO2: 24 mmol/L (ref 20–29)
Calcium: 9.3 mg/dL (ref 8.6–10.2)
Chloride: 104 mmol/L (ref 96–106)
Creatinine, Ser: 1.01 mg/dL (ref 0.76–1.27)
GFR calc Af Amer: 88 mL/min/{1.73_m2} (ref >59)
GFR calc non Af Amer: 76 mL/min/{1.73_m2} (ref >59)
Glucose: 98 mg/dL (ref 65–99)
Potassium: 4.3 mmol/L (ref 3.5–5.2)
Sodium: 140 mmol/L (ref 134–144)

## 2020-03-06 ENCOUNTER — Ambulatory Visit (INDEPENDENT_AMBULATORY_CARE_PROVIDER_SITE_OTHER): Payer: Medicare Other | Admitting: Family Medicine

## 2020-03-06 ENCOUNTER — Other Ambulatory Visit: Payer: Self-pay

## 2020-03-06 VITALS — BP 128/82 | HR 55 | Temp 97.0°F | Ht 68.5 in | Wt 180.2 lb

## 2020-03-06 DIAGNOSIS — E7800 Pure hypercholesterolemia, unspecified: Secondary | ICD-10-CM | POA: Diagnosis not present

## 2020-03-06 DIAGNOSIS — Z0000 Encounter for general adult medical examination without abnormal findings: Secondary | ICD-10-CM

## 2020-03-06 DIAGNOSIS — K208 Other esophagitis without bleeding: Secondary | ICD-10-CM

## 2020-03-06 DIAGNOSIS — Z8619 Personal history of other infectious and parasitic diseases: Secondary | ICD-10-CM | POA: Diagnosis not present

## 2020-03-06 DIAGNOSIS — Z8042 Family history of malignant neoplasm of prostate: Secondary | ICD-10-CM | POA: Diagnosis not present

## 2020-03-06 DIAGNOSIS — I480 Paroxysmal atrial fibrillation: Secondary | ICD-10-CM

## 2020-03-06 DIAGNOSIS — I10 Essential (primary) hypertension: Secondary | ICD-10-CM

## 2020-03-06 DIAGNOSIS — Z860100 Personal history of colon polyps, unspecified: Secondary | ICD-10-CM

## 2020-03-06 DIAGNOSIS — Z125 Encounter for screening for malignant neoplasm of prostate: Secondary | ICD-10-CM

## 2020-03-06 DIAGNOSIS — L649 Androgenic alopecia, unspecified: Secondary | ICD-10-CM | POA: Diagnosis not present

## 2020-03-06 DIAGNOSIS — E785 Hyperlipidemia, unspecified: Secondary | ICD-10-CM

## 2020-03-06 DIAGNOSIS — K219 Gastro-esophageal reflux disease without esophagitis: Secondary | ICD-10-CM

## 2020-03-06 NOTE — Patient Instructions (Signed)
  Mr. Osborne , Thank you for taking time to come for your Medicare Wellness Visit. I appreciate your ongoing commitment to your health goals. Please review the following plan we discussed and let me know if I can assist you in the future.   These are the goals we discussed: Goals   None     This is a list of the screening recommended for you and due dates:  Health Maintenance  Topic Date Due  . COVID-19 Vaccine (1) Never done  . Flu Shot  05/14/2020  . Tetanus Vaccine  11/03/2026  . Colon Cancer Screening  07/06/2028  .  Hepatitis C: One time screening is recommended by Center for Disease Control  (CDC) for  adults born from [birth year] through 1965.   Completed  . Pneumonia vaccines  Completed

## 2020-03-06 NOTE — Progress Notes (Signed)
Maurice [name] is a 69 y.o. male who presents for annual wellness visit and follow-up on chronic medical conditions.  He has ongoing issues with PAF and presently has a monitor him.  He is planning to go for his fourth ablation on June 8.  Presently he is taking Xarelto regularly, and diltiazem on an as-needed basis.Marland Kitchen  He is also taking metoprolol.  He continues on atorvastatin without difficulty and is also.  His reflux seems to be under good control with Prilosec on an as-needed basis.  He does occasionally have difficulty with herpes labialis and responds well to acyclovir.  He does have a history of colonic polyps and will be scheduled for a follow-up in 2024. He continues on finasteride for his male pattern baldness.  He does not smoke.  He does keep himself in good physical condition.  His home life is going well.  Immunizations and Health Maintenance Immunization History  Administered Date(s) Administered  . Hepatitis A 02/15/2009  . IPV 10/14/1962  . Influenza Split 07/23/2013, 07/22/2015  . Influenza, High Dose Seasonal PF 07/25/2017  . Influenza-Unspecified 07/28/2014, 07/26/2016  . MMR 10/15/1959, 09/23/1988  . Meningococcal Polysaccharide 07/23/2013  . Pneumococcal Conjugate-13 11/01/2016  . Pneumococcal Polysaccharide-23 11/13/2017  . Tdap 07/30/2007, 11/03/2016  . Zoster 05/18/2013  . Zoster Recombinat (Shingrix) 03/17/2018, 05/19/2018   Health Maintenance Due  Topic Date Due  . COVID-19 Vaccine (1) Never done    Last colonoscopy:07/06/18  Last PSA: 02/26/19 Dentist:Q six moonths  09/2019 Ophtho: Q yearly 10/202 Exercise: 3-4 times a week  Other doctors caring for patient include: Dr. Rayann Heman cardio, Dr. William Dalton, Dr. Collene Mares GI  Advanced Directives: not on file  Does Patient Have a Medical Advance Directive?: Yes Type of Advance Directive: [place] will Does patient want to make changes to medical advance directive?: No - Patient  declined Copy of [place] in Chart?: No - copy requested  Depression screen:  See questionnaire below.     Depression screen [facility] 2/9 03/06/2020 02/26/2019 11/13/2017 11/01/2016 05/30/2015  Decreased Interest 0 0 0 0 0  Down, Depressed, Hopeless 0 0 0 0 0  PHQ - 2 Score 0 0 0 0 0    Fall Screen: See Questionaire below.   Fall Risk  03/06/2020 02/26/2019 11/13/2017 11/01/2016 05/30/2015  Falls in the past year? 0 0 No No No  Number falls in past yr: - - - - -  Injury with Fall? - - - - -  Risk for fall due to : - - - - -  Risk for fall due to: Comment - - - - -    ADL screen:  See questionnaire below.  Functional Status Survey: Is the patient deaf or have difficulty hearing?: No Does the patient have difficulty seeing, even when wearing glasses/contacts?: No Does the patient have difficulty concentrating, remembering, or making decisions?: No Does the patient have difficulty walking or climbing stairs?: No Does the patient have difficulty dressing or bathing?: No Does the patient have difficulty doing errands alone such as visiting a doctor's office or shopping?: No   Review of Systems  Constitutional: -, -unexpected weight change, -anorexia, -fatigue Allergy: -sneezing, -itching, -congestion Dermatology: denies changing moles, rash, lumps ENT: -runny nose, -ear pain, -sore throat,  Cardiology:  -chest pain, -palpitations, -orthopnea, Respiratory: -cough, -shortness of breath, -dyspnea on exertion, -wheezing,  Gastroenterology: -abdominal pain, -nausea, -vomiting, -diarrhea, -constipation, -dysphagia Hematology: -bleeding or bruising problems Musculoskeletal: -arthralgias, -myalgias, -joint swelling, -back  pain, - Ophthalmology: -vision changes,  Urology: -dysuria, -difficulty urinating,  -urinary frequency, -urgency, incontinence Neurology: -, -numbness, , -memory loss, -falls, -dizziness    PHYSICAL EXAM:  BP 128/82   Pulse (!) 55   Temp (!) 97 F (36.1  C)   Ht 5' 8.5" (1.74 m)   Wt 180 lb 3.2 oz (81.7 kg)   SpO2 97%   BMI 27.00 kg/m   General Appearance: Alert, cooperative, no distress, appears stated age Head: Normocephalic, without obvious abnormality, atraumatic Eyes: PERRL, conjunctiva/corneas clear, EOM's intact,  Ears: Normal TM's and external ear canals Nose: Nares normal, mucosa normal, no drainage or sinus   tenderness Throat: Lips, mucosa, and tongue normal; teeth and gums normal Neck: Supple, no lymphadenopathy, thyroid:no enlargement/tenderness/nodules; no carotid bruit or JVD Lungs: Clear to auscultation bilaterally without wheezes, rales or ronchi; respirations unlabored Heart: Regular rate and rhythm, S1 and S2 normal, no murmur, rub or gallop Abdomen: Soft, non-tender, nondistended, normoactive bowel sounds, no masses, no hepatosplenomegaly Extremities: No clubbing, cyanosis or edema Pulses: 2+ and symmetric all extremities Skin: Skin color, texture, turgor normal, no rashes or lesions Lymph nodes: Cervical, supraclavicular, and axillary nodes normal Neurologic: CNII-XII intact, normal strength, sensation and gait; reflexes 2+ and symmetric throughout   Psych: Normal mood, affect, hygiene and grooming  ASSESSMENT/PLAN: Paroxysmal atrial fibrillation [facility])  Male pattern baldness  Pure hypercholesterolemia - Plan: Lipid panel  History of herpes labialis  History of colonic polyps  Gastroesophageal reflux disease without esophagitis  Family history of prostate cancer - Plan: PSA  Essential hypertension, benign  Esophagitis, [place] grade B  Hyperlipidemia, unspecified hyperlipidemia type  Screening for prostate cancer - Plan: PSA At the present time he does not need renewal of any of his medications but will call me when he does.  We discussed the PAF in detail.  He is comfortable with how it is being handled. . Immunization recommendations discussed.  Colonoscopy recommendations  reviewed.   Medicare Attestation I have personally reviewed: The patient's medical and social history Their use of alcohol, tobacco or illicit drugs Their current medications and supplements The patient's functional ability including ADLs,fall risks, home safety risks, cognitive, and hearing and visual impairment Diet and physical activities Evidence for depression or mood disorders  The patient's weight, height, and BMI have been recorded in the chart.  I have made referrals, counseling, and provided education to the patient based on review of the above and I have provided the patient with a written personalized care plan for preventive services.     [name], MD   03/06/2020

## 2020-03-07 LAB — LIPID PANEL
Chol/HDL Ratio: 2.9 ratio (ref 0.0–5.0)
Cholesterol, Total: 178 mg/dL (ref 100–199)
HDL: 61 mg/dL (ref >39)
LDL Chol Calc (NIH): 96 mg/dL (ref 0–99)
Triglycerides: 118 mg/dL (ref 0–149)
VLDL Cholesterol Cal: 21 mg/dL (ref 5–40)

## 2020-03-07 LAB — PSA: Prostate Specific Ag, Serum: 0.3 ng/mL (ref 0.0–4.0)

## 2020-03-10 ENCOUNTER — Telehealth (HOSPITAL_COMMUNITY): Payer: Self-pay | Admitting: *Deleted

## 2020-03-10 NOTE — Telephone Encounter (Signed)
Pt returning call regarding upcoming cardiac imaging study; pt verbalizes understanding of appt date/time, parking situation and where to check in, pre-test NPO status and medications ordered, and verified current allergies; name and call back number provided for further questions should they arise  Maurice [name] Tai RN Navigator Cardiac Imaging [place] [facility] [phone] office [phone] cell  

## 2020-03-10 NOTE — Telephone Encounter (Signed)
Attempted to call patient regarding upcoming cardiac CT appointment. Left message on voicemail with name and callback number  Kanijah Groseclose Tai RN Navigator Cardiac Imaging  Heart and Vascular Services [phone] Office [phone] Cell  

## 2020-03-14 ENCOUNTER — Ambulatory Visit (HOSPITAL_COMMUNITY)
Admission: RE | Admit: 2020-03-14 | Discharge: 2020-03-14 | Disposition: A | Discharge status: Home / Self Care | Payer: Medicare Other | Source: Ambulatory Visit | Attending: Internal Medicine | Admitting: Internal Medicine

## 2020-03-14 ENCOUNTER — Other Ambulatory Visit: Payer: Self-pay

## 2020-03-14 DIAGNOSIS — I4891 Unspecified atrial fibrillation: Secondary | ICD-10-CM | POA: Diagnosis not present

## 2020-03-14 MED ORDER — IOHEXOL 350 MG/ML SOLN
80.0000 mL | Freq: Once | INTRAVENOUS | Status: AC | PRN
  Administered 2020-03-14: 80 mL via INTRAVENOUS

## 2020-03-15 ENCOUNTER — Telehealth: Payer: Self-pay

## 2020-03-15 NOTE — Telephone Encounter (Signed)
Call placed to Pt regarding rescheduling afib ablation.  Pt was very kind to reschedule to March 24, 2020 first case.  Pt covid test rescheduled for March 21, 2020   Went over all ablation instructions

## 2020-03-16 ENCOUNTER — Ambulatory Visit (INDEPENDENT_AMBULATORY_CARE_PROVIDER_SITE_OTHER): Payer: Medicare Other | Admitting: *Deleted

## 2020-03-16 DIAGNOSIS — I480 Paroxysmal atrial fibrillation: Secondary | ICD-10-CM

## 2020-03-16 LAB — CUP PACEART REMOTE DEVICE CHECK
Date Time Interrogation Session: 20210603110756
Implantable Pulse Generator Implant Date: 20201228

## 2020-03-18 ENCOUNTER — Other Ambulatory Visit (HOSPITAL_COMMUNITY): Payer: Medicare Other

## 2020-03-21 ENCOUNTER — Other Ambulatory Visit (HOSPITAL_COMMUNITY)
Admission: RE | Admit: 2020-03-21 | Discharge: 2020-03-21 | Disposition: A | Discharge status: Home / Self Care | Payer: Medicare Other | Source: Ambulatory Visit | Attending: Internal Medicine | Admitting: Internal Medicine

## 2020-03-21 DIAGNOSIS — Z01812 Encounter for preprocedural laboratory examination: Secondary | ICD-10-CM | POA: Diagnosis not present

## 2020-03-21 DIAGNOSIS — Z20822 Contact with and (suspected) exposure to covid-19: Secondary | ICD-10-CM | POA: Diagnosis not present

## 2020-03-21 LAB — SARS CORONAVIRUS 2 (TAT 6-24 HRS): SARS Coronavirus 2: NEGATIVE

## 2020-03-21 NOTE — Progress Notes (Signed)
Carelink Summary Report / Loop Recorder 

## 2020-03-24 ENCOUNTER — Ambulatory Visit (HOSPITAL_COMMUNITY): Payer: Medicare Other | Admitting: Certified Registered Nurse Anesthetist

## 2020-03-24 ENCOUNTER — Encounter (HOSPITAL_COMMUNITY)
Admission: RE | Disposition: A | Discharge status: Home / Self Care | Payer: Medicare Other | Source: Home / Self Care | Attending: Internal Medicine

## 2020-03-24 ENCOUNTER — Ambulatory Visit (HOSPITAL_COMMUNITY)
Admission: RE | Admit: 2020-03-24 | Discharge: 2020-03-24 | Disposition: A | Discharge status: Home / Self Care | Payer: Medicare Other | Attending: Internal Medicine | Admitting: Internal Medicine

## 2020-03-24 ENCOUNTER — Other Ambulatory Visit: Payer: Self-pay

## 2020-03-24 DIAGNOSIS — I480 Paroxysmal atrial fibrillation: Secondary | ICD-10-CM | POA: Diagnosis not present

## 2020-03-24 DIAGNOSIS — Z79899 Other long term (current) drug therapy: Secondary | ICD-10-CM | POA: Diagnosis not present

## 2020-03-24 DIAGNOSIS — E7800 Pure hypercholesterolemia, unspecified: Secondary | ICD-10-CM | POA: Diagnosis not present

## 2020-03-24 DIAGNOSIS — E785 Hyperlipidemia, unspecified: Secondary | ICD-10-CM | POA: Diagnosis not present

## 2020-03-24 DIAGNOSIS — K209 Esophagitis, unspecified without bleeding: Secondary | ICD-10-CM | POA: Diagnosis not present

## 2020-03-24 DIAGNOSIS — I10 Essential (primary) hypertension: Secondary | ICD-10-CM | POA: Diagnosis not present

## 2020-03-24 HISTORY — PX: ATRIAL FIBRILLATION ABLATION: EP1191

## 2020-03-24 LAB — CBC
HCT: 45.8 % (ref 39.0–52.0)
Hemoglobin: 15.3 g/dL (ref 13.0–17.0)
MCH: 30.1 pg (ref 26.0–34.0)
MCHC: 33.4 g/dL (ref 30.0–36.0)
MCV: 90 fL (ref 80.0–100.0)
Platelets: 184 10*3/uL (ref 150–400)
RBC: 5.09 MIL/uL (ref 4.22–5.81)
RDW: 12.6 % (ref 11.5–15.5)
WBC: 5.7 10*3/uL (ref 4.0–10.5)
nRBC: 0 % (ref 0.0–0.2)

## 2020-03-24 LAB — BASIC METABOLIC PANEL
Anion gap: 8 (ref 5–15)
BUN: 11 mg/dL (ref 8–23)
CO2: 23 mmol/L (ref 22–32)
Calcium: 8.9 mg/dL (ref 8.9–10.3)
Chloride: 110 mmol/L (ref 98–111)
Creatinine, Ser: 0.9 mg/dL (ref 0.61–1.24)
GFR calc Af Amer: >60 mL/min (ref >60)
GFR calc non Af Amer: >60 mL/min (ref >60)
Glucose, Bld: 97 mg/dL (ref 70–99)
Potassium: 4.1 mmol/L (ref 3.5–5.1)
Sodium: 141 mmol/L (ref 135–145)

## 2020-03-24 LAB — POCT ACTIVATED CLOTTING TIME: Activated Clotting Time: 268 seconds

## 2020-03-24 SURGERY — ATRIAL FIBRILLATION ABLATION
Anesthesia: General

## 2020-03-24 MED ORDER — HEPARIN SODIUM (PORCINE) 1000 UNIT/ML IJ SOLN
INTRAMUSCULAR | Status: DC | PRN
  Administered 2020-03-24: 14000 [IU] via INTRAVENOUS
  Administered 2020-03-24: 1000 [IU] via INTRAVENOUS

## 2020-03-24 MED ORDER — LIDOCAINE 2% (20 MG/ML) 5 ML SYRINGE
INTRAMUSCULAR | Status: DC | PRN
  Administered 2020-03-24: 100 mg via INTRAVENOUS

## 2020-03-24 MED ORDER — HEPARIN (PORCINE) IN NACL 1000-0.9 UT/500ML-% IV SOLN
INTRAVENOUS | Status: DC | PRN
  Administered 2020-03-24: 500 mL

## 2020-03-24 MED ORDER — SODIUM CHLORIDE 0.9% FLUSH: 3.0000 mL | Freq: Two times a day (BID) | INTRAVENOUS | Status: DC

## 2020-03-24 MED ORDER — FENTANYL CITRATE (PF) 250 MCG/5ML IJ SOLN
INTRAMUSCULAR | Status: DC | PRN
  Administered 2020-03-24 (×4): 25 ug via INTRAVENOUS

## 2020-03-24 MED ORDER — PHENYLEPHRINE 40 MCG/ML (10ML) SYRINGE FOR IV PUSH (FOR BLOOD PRESSURE SUPPORT)
PREFILLED_SYRINGE | INTRAVENOUS | Status: DC | PRN
  Administered 2020-03-24: 120 ug via INTRAVENOUS
  Administered 2020-03-24 (×4): 80 ug via INTRAVENOUS
  Administered 2020-03-24: 40 ug via INTRAVENOUS
  Administered 2020-03-24: 160 ug via INTRAVENOUS

## 2020-03-24 MED ORDER — SODIUM CHLORIDE 0.9 % IV SOLN: INTRAVENOUS | Status: DC

## 2020-03-24 MED ORDER — PROPOFOL 10 MG/ML IV BOLUS
INTRAVENOUS | Status: DC | PRN
  Administered 2020-03-24: 130 mg via INTRAVENOUS

## 2020-03-24 MED ORDER — ROCURONIUM BROMIDE 10 MG/ML (PF) SYRINGE
PREFILLED_SYRINGE | INTRAVENOUS | Status: DC | PRN
  Administered 2020-03-24: 50 mg via INTRAVENOUS

## 2020-03-24 MED ORDER — SUGAMMADEX SODIUM 200 MG/2ML IV SOLN
INTRAVENOUS | Status: DC | PRN
  Administered 2020-03-24: 200 mg via INTRAVENOUS

## 2020-03-24 MED ORDER — PHENYLEPHRINE HCL-NACL 10-0.9 MG/250ML-% IV SOLN
INTRAVENOUS | Status: DC | PRN
  Administered 2020-03-24: 25 ug/min via INTRAVENOUS

## 2020-03-24 MED ORDER — ADENOSINE 6 MG/2ML IV SOLN
INTRAVENOUS | Status: DC | PRN
  Administered 2020-03-24 (×2): 12 mg via INTRAVENOUS

## 2020-03-24 MED ORDER — HEPARIN SODIUM (PORCINE) 1000 UNIT/ML IJ SOLN
INTRAMUSCULAR | Status: AC
  Filled 2020-03-24: qty 1

## 2020-03-24 MED ORDER — DEXAMETHASONE SODIUM PHOSPHATE 10 MG/ML IJ SOLN
INTRAMUSCULAR | Status: DC | PRN
  Administered 2020-03-24: 5 mg via INTRAVENOUS

## 2020-03-24 MED ORDER — HEPARIN SODIUM (PORCINE) 1000 UNIT/ML IJ SOLN
INTRAMUSCULAR | Status: DC | PRN
  Administered 2020-03-24: 4000 [IU] via INTRAVENOUS

## 2020-03-24 MED ORDER — SODIUM CHLORIDE 0.9 % IV SOLN: 250.0000 mL | INTRAVENOUS | Status: DC | PRN

## 2020-03-24 MED ORDER — ISOPROTERENOL HCL 0.2 MG/ML IJ SOLN
INTRAVENOUS | Status: DC | PRN
  Administered 2020-03-24: 20 ug/min via INTRAVENOUS

## 2020-03-24 MED ORDER — ONDANSETRON HCL 4 MG/2ML IJ SOLN
INTRAMUSCULAR | Status: DC | PRN
  Administered 2020-03-24: 4 mg via INTRAVENOUS

## 2020-03-24 MED ORDER — SUCCINYLCHOLINE CHLORIDE 200 MG/10ML IV SOSY
PREFILLED_SYRINGE | INTRAVENOUS | Status: DC | PRN
  Administered 2020-03-24: 100 mg via INTRAVENOUS

## 2020-03-24 MED ORDER — HEPARIN (PORCINE) IN NACL 1000-0.9 UT/500ML-% IV SOLN
INTRAVENOUS | Status: AC
  Filled 2020-03-24: qty 500

## 2020-03-24 MED ORDER — ISOPROTERENOL HCL 0.2 MG/ML IJ SOLN
INTRAMUSCULAR | Status: AC
  Filled 2020-03-24: qty 5

## 2020-03-24 MED ORDER — PROTAMINE SULFATE 10 MG/ML IV SOLN
INTRAVENOUS | Status: DC | PRN
  Administered 2020-03-24: 40 mg via INTRAVENOUS

## 2020-03-24 MED ORDER — LACTATED RINGERS IV SOLN: INTRAVENOUS | Status: DC | PRN

## 2020-03-24 MED ORDER — ADENOSINE 6 MG/2ML IV SOLN
INTRAVENOUS | Status: AC
  Filled 2020-03-24: qty 8

## 2020-03-24 MED ORDER — MIDAZOLAM HCL 2 MG/2ML IJ SOLN
INTRAMUSCULAR | Status: DC | PRN
  Administered 2020-03-24: 2 mg via INTRAVENOUS

## 2020-03-24 SURGICAL SUPPLY — 21 items
BLANKET WARM UNDERBOD FULL ACC (MISCELLANEOUS) ×2 IMPLANT
CATH 8FR REPROCESSED SOUNDSTAR (CATHETERS) ×2 IMPLANT
CATH 8FR SOUNDSTAR REPROCESSED (CATHETERS) IMPLANT
CATH MAPPNG PENTARAY F 2-6-2MM (CATHETERS) IMPLANT
CATH SMTCH THERMOCOOL SF DF (CATHETERS) ×1 IMPLANT
CATH WEBSTER BI DIR CS D-F CRV (CATHETERS) ×1 IMPLANT
COVER SWIFTLINK CONNECTOR (BAG) ×2 IMPLANT
DEVICE CLOSURE PERCLS PRGLD 6F (VASCULAR PRODUCTS) IMPLANT
NDL BAYLIS TRANSSEPTAL 71CM (NEEDLE) IMPLANT
NEEDLE BAYLIS TRANSSEPTAL 71CM (NEEDLE) ×2 IMPLANT
PACK EP LATEX FREE (CUSTOM PROCEDURE TRAY) ×2
PACK EP LF (CUSTOM PROCEDURE TRAY) ×1 IMPLANT
PAD PRO RADIOLUCENT 2001M-C (PAD) ×2 IMPLANT
PATCH CARTO3 (PAD) ×1 IMPLANT
PENTARAY F 2-6-2MM (CATHETERS) ×2
PERCLOSE PROGLIDE 6F (VASCULAR PRODUCTS) ×6
SHEATH PINNACLE 7F 10CM (SHEATH) ×2 IMPLANT
SHEATH PINNACLE 9F 10CM (SHEATH) ×1 IMPLANT
SHEATH PROBE COVER 6X72 (BAG) ×1 IMPLANT
SHEATH SWARTZ TS SL2 63CM 8.5F (SHEATH) ×1 IMPLANT
TUBING SMART ABLATE COOLFLOW (TUBING) ×1 IMPLANT

## 2020-03-24 NOTE — Anesthesia Procedure Notes (Addendum)
Procedure Name: Intubation Date/Time: 03/24/2020 10:41 AM Performed by: Kathryne Hitch, CRNA Pre-anesthesia Checklist: Patient identified, Emergency Drugs available, Suction available and Patient being monitored Patient Re-evaluated:Patient Re-evaluated prior to induction Oxygen Delivery Method: Circle system utilized Preoxygenation: Pre-oxygenation with 100% oxygen Induction Type: IV induction Ventilation: Mask ventilation without difficulty Laryngoscope Size: Miller and 2 Grade View: Grade II Tube type: Oral Tube size: 7.0 mm Number of attempts: 1 Airway Equipment and Method: Stylet and Oral airway Placement Confirmation: ETT inserted through vocal cords under direct vision,  positive ETCO2 and breath sounds checked- equal and bilateral Secured at: 22 cm Tube secured with: Tape Dental Injury: Teeth and Oropharynx as per pre-operative assessment

## 2020-03-24 NOTE — H&P (Signed)
Chief Complaint:  palpitations  History of Present Illness:    Maurice [name] is a 69 y.o. male who presents for afib ablation.  Since last being seen in our clinic, the patient reports doing very well.  He continues to have afib episodes.  He feels that his afib has increased in frequency and duration. Today, he denies symptoms of chest pain, shortness of breath,  lower extremity edema, dizziness, presyncope, or syncope.  The patient is otherwise without complaint today.       Past Medical History:  Diagnosis Date  . Atrial fibrillation (HCC)    paroxysmal s/p PVI by JA  . Diverticulosis    Colonoscopy 07/2013  . Herpes   . History of hepatitis B   . Hypercholesterolemia   . Varicose veins          Past Surgical History:  Procedure Laterality Date  . atrail fibrillation ablation  7/10, 1//17/14   PVI by JA  . ATRIAL FIBRILLATION ABLATION N/A 10/29/2012   Procedure: ATRIAL FIBRILLATION ABLATION;  Surgeon: Thompson Grayer, MD;  Location: [facility] CATH LAB;  Service: Cardiovascular;  Laterality: N/A;  . CARDIOVERSION N/A 09/20/2015   Procedure: CARDIOVERSION;  Surgeon: Thayer Headings, MD;  Location: [facility] ENDOSCOPY;  Service: Cardiovascular;  Laterality: N/A;  . CARDIOVERSION N/A 09/28/2015   Procedure: CARDIOVERSION;  Surgeon: Lelon Perla, MD;  Location: [place];  Service: Cardiovascular;  Laterality: N/A;  . COLONOSCOPY  2004  . ELECTROPHYSIOLOGIC STUDY N/A 11/28/2015   3rd AF ablation by Dr Rayann Heman  . implantable loop recorder placement  10/11/2019   Medtronic Reveal Linq model LNQ22 (SN [id number]) by Dr Rayann Heman for afib management post ablation  . SKIN BIOPSY Right 11/03/2018  . TEE WITHOUT CARDIOVERSION  10/29/2012   Procedure: TRANSESOPHAGEAL ECHOCARDIOGRAM (TEE);  Surgeon: Fay Records, MD;  Location: [facility] ENDOSCOPY;  Service: Cardiovascular;  Laterality: N/A;  pre ablation  . TEE WITHOUT CARDIOVERSION N/A 09/20/2015   Procedure: TRANSESOPHAGEAL  ECHOCARDIOGRAM (TEE);  Surgeon: Thayer Headings, MD;  Location: [place];  Service: Cardiovascular;  Laterality: N/A;  . TONSILLECTOMY     when child          Current Outpatient Medications  Medication Sig Dispense Refill  . acyclovir (ZOVIRAX) 400 MG tablet TAKE 1 TABLET BY MOUTH THREE TIMES DAILY AS NEEDED AS DIRECTED 30 tablet 0  . atorvastatin (LIPITOR) 40 MG tablet TAKE 1 TABLET(40 MG) BY MOUTH DAILY 90 tablet 3  . diltiazem (CARDIZEM) 30 MG tablet TAKE 1 TABLET BY MOUTH EVERY 4 HOURS AS NEEDED FOR HR GREATER THAN 100 45 tablet 2  . finasteride (PROSCAR) 5 MG tablet TAKE 1 TABLET(5 MG) BY MOUTH DAILY 90 tablet 3  . meclizine (ANTIVERT) 25 MG tablet Take 25 mg by mouth as directed. Take as needed for lightheadedness/vertigo    . omeprazole (PRILOSEC) 40 MG capsule Take 40 mg by mouth daily as needed (heartburn or acid reflux).     No current facility-administered medications for this visit.    Allergies:   Patient has no known allergies.   Social History:  The patient  reports that he has never smoked. He has never used smokeless tobacco. He reports current alcohol use of about 1.0 standard drinks of alcohol per week. He reports that he does not use drugs.   ROS:  Please see the history of present illness.   All other systems are personally reviewed and negative.   Physical Exam: Vitals:   03/24/20 0725  BP:  125/83  Pulse: (!) 55  Resp: 20  Temp: 97.6 F (36.4 C)  TempSrc: Temporal  SpO2: 95%  Weight: 79.4 kg  Height: 5\' 9"  (1.753 m)    GEN- The patient is well appearing, alert and oriented x 3 today.   Head- normocephalic, atraumatic Eyes-  Sclera clear, conjunctiva pink Ears- hearing intact Oropharynx- clear Neck- supple, Lungs-   normal work of breathing Heart- Regular rate and rhythm  GI- soft, NT, ND, + BS Extremities- no clubbing, cyanosis, or edema, groin is without hematoma/ bruit     Labs/Other Tests and Data Reviewed:    Recent  Labs: 02/26/2019: ALT 42; BUN 15; Creatinine, Ser 1.08; Hemoglobin 16.8; Platelets 206; Potassium 4.6; Sodium 141      Wt Readings from Last 3 Encounters:  10/11/19 182 lb 3.2 oz (82.6 kg)  09/29/19 182 lb 6.4 oz (82.7 kg)  09/22/19 179 lb 12.8 oz (81.6 kg)     ASSESSMENT & PLAN:    1.  Paroxysmal atrial fibrillation The patient has symptomatic, recurrent paroxysmal atrial fibrillation. he has failed medical therapy with flecainide.  He felt "horrible" with this medicine. Chads2vasc score is 1.   Therapeutic strategies for afib including medicine (multaq, flecainide, propafenone, tikosyn) and ablation were discussed in detail with the patient today. Risk, benefits, and alternatives to EP study and radiofrequency ablation for afib were also discussed in detail today. These risks include but are not limited to stroke, bleeding, vascular damage, tamponade, perforation, damage to the esophagus, lungs, and other structures, pulmonary vein stenosis, worsening renal function, and death. The patient understands these risk and wishes to proceed.  We will therefore proceed with catheter ablation at the next available time.  Carto, ICE, anesthesia are requested for the procedure.  Will also obtain cardiac CT prior to the procedure to exclude LAA thrombus and further evaluate atrial anatomy.   Cardiac CT reviewed with him at length.  He reports compliance with xarelto without interruption.  Thompson Grayer MD, [facility] 03/24/2020 8:18 AM

## 2020-03-24 NOTE — Discharge Instructions (Signed)
No driving for 4 days. No lifting over 5 lbs for 1 week. No sexual activity for 1 week. You may return to work in 1 week. Keep procedure site clean & dry. If you notice increased pain, swelling, bleeding or pus, call/return!  You may shower, but no soaking baths/hot tubs/pools for 1 week.  ° ° °You have an appointment set up with the Atrial Fibrillation Clinic.  Multiple studies have shown that being followed by a dedicated atrial fibrillation clinic in addition to the standard care you receive from your other physicians improves health. We believe that enrollment in the atrial fibrillation clinic will allow us to better care for you.  ° °The phone number to the Atrial Fibrillation Clinic is [phone]. The clinic is staffed Monday through Friday from 8:30am to 5pm. ° °Parking Directions: The clinic is located in the Heart and Vascular Building connected to [facility]. °1)From [address] turn on to [address] and go to the 3rd entrance  (Heart and Vascular entrance) on the right. °2)Look to the right for Heart &Vascular Parking Garage. °3)A code for the entrance is required please call the clinic to receive this.   °4)Take the elevators to the 1st floor. Registration is in the room with the glass walls at the end of the hallway. ° °If you have any trouble parking or locating the clinic, please don’t hesitate to call [phone]. ° ° °

## 2020-03-24 NOTE — Anesthesia Preprocedure Evaluation (Signed)
Anesthesia Evaluation  Patient identified by MRN, date of birth, ID band Patient awake    Reviewed: Allergy & Precautions, NPO status , Patient's Chart, lab work & pertinent test results  Airway Mallampati: II  TM Distance: >3 FB     Dental   Pulmonary neg pulmonary ROS,    breath sounds clear to auscultation       Cardiovascular hypertension,  Rhythm:Regular Rate:Normal     Neuro/Psych negative neurological ROS     GI/Hepatic Neg liver ROS, GERD  ,  Endo/Other  negative endocrine ROS  Renal/GU negative Renal ROS     Musculoskeletal   Abdominal   Peds  Hematology   Anesthesia Other Findings   Reproductive/Obstetrics                             Anesthesia Physical Anesthesia Plan  ASA: III  Anesthesia Plan: General   Post-op Pain Management:    Induction: Intravenous  PONV Risk Score and Plan: 2 and Ondansetron, Dexamethasone and Midazolam  Airway Management Planned: Oral ETT  Additional Equipment:   Intra-op Plan:   Post-operative Plan: Extubation in OR  Informed Consent: I have reviewed the patients History and Physical, chart, labs and discussed the procedure including the risks, benefits and alternatives for the proposed anesthesia with the patient or authorized representative who has indicated his/her understanding and acceptance.     Dental advisory given  Plan Discussed with: CRNA and Anesthesiologist  Anesthesia Plan Comments:         Anesthesia Quick Evaluation

## 2020-03-24 NOTE — Anesthesia Postprocedure Evaluation (Signed)
Anesthesia Post Note  Patient: Maurice [name]  Procedure(s) Performed: ATRIAL FIBRILLATION ABLATION (N/A )     Patient location during evaluation: PACU Anesthesia Type: General Level of consciousness: awake Pain management: pain level controlled Vital Signs Assessment: post-procedure vital signs reviewed and stable Respiratory status: spontaneous breathing Cardiovascular status: stable Postop Assessment: no apparent nausea or vomiting Anesthetic complications: no   No complications documented.  Last Vitals:  Vitals:   03/24/20 1445 03/24/20 1515  BP: 120/72 125/74  Pulse: 65 65  Resp: 19 16  Temp:    SpO2: 96% 96%    Last Pain:  Vitals:   03/24/20 1316  TempSrc: Temporal  PainSc: 0-No pain                 Chamberlain Steinborn

## 2020-03-24 NOTE — Transfer of Care (Signed)
Immediate Anesthesia Transfer of Care Note  Patient: Maurice [name]  Procedure(s) Performed: ATRIAL FIBRILLATION ABLATION (N/A )  Patient Location: Cath Lab  Anesthesia Type:General  Level of Consciousness: awake, alert  and patient cooperative  Airway & Oxygen Therapy: Patient Spontanous Breathing and Patient connected to nasal cannula oxygen  Post-op Assessment: Report given to RN and Post -op Vital signs reviewed and stable  Post vital signs: Reviewed and stable  Last Vitals:  Vitals Value Taken Time  BP    Temp    Pulse    Resp    SpO2      Last Pain:  Vitals:   03/24/20 0827  TempSrc:   PainSc: 0-No pain      Patients Stated Pain Goal: 3 (38/88/75 7972)  Complications: No complications documented.

## 2020-03-27 ENCOUNTER — Encounter (HOSPITAL_COMMUNITY): Payer: Self-pay | Admitting: Internal Medicine

## 2020-04-18 ENCOUNTER — Ambulatory Visit (HOSPITAL_COMMUNITY): Payer: Medicare Other | Admitting: Nurse Practitioner

## 2020-04-18 ENCOUNTER — Ambulatory Visit (INDEPENDENT_AMBULATORY_CARE_PROVIDER_SITE_OTHER): Payer: Medicare Other | Admitting: *Deleted

## 2020-04-18 DIAGNOSIS — I480 Paroxysmal atrial fibrillation: Secondary | ICD-10-CM | POA: Diagnosis not present

## 2020-04-18 LAB — CUP PACEART REMOTE DEVICE CHECK
Date Time Interrogation Session: 20210706110937
Implantable Pulse Generator Implant Date: 20201228

## 2020-04-19 NOTE — Progress Notes (Signed)
Carelink Summary Report / Loop Recorder 

## 2020-04-21 ENCOUNTER — Other Ambulatory Visit: Payer: Self-pay

## 2020-04-21 ENCOUNTER — Encounter (HOSPITAL_COMMUNITY): Payer: Self-pay | Admitting: Nurse Practitioner

## 2020-04-21 ENCOUNTER — Ambulatory Visit (HOSPITAL_COMMUNITY)
Admission: RE | Admit: 2020-04-21 | Discharge: 2020-04-21 | Disposition: A | Discharge status: Home / Self Care | Payer: Medicare Other | Source: Ambulatory Visit | Attending: Nurse Practitioner | Admitting: Nurse Practitioner

## 2020-04-21 VITALS — BP 130/86 | HR 62 | Ht 69.0 in | Wt 179.4 lb

## 2020-04-21 DIAGNOSIS — Z8619 Personal history of other infectious and parasitic diseases: Secondary | ICD-10-CM | POA: Diagnosis not present

## 2020-04-21 DIAGNOSIS — Z79899 Other long term (current) drug therapy: Secondary | ICD-10-CM | POA: Diagnosis not present

## 2020-04-21 DIAGNOSIS — I480 Paroxysmal atrial fibrillation: Secondary | ICD-10-CM | POA: Insufficient documentation

## 2020-04-21 DIAGNOSIS — Z7901 Long term (current) use of anticoagulants: Secondary | ICD-10-CM | POA: Diagnosis not present

## 2020-04-21 DIAGNOSIS — D6869 Other thrombophilia: Secondary | ICD-10-CM

## 2020-04-21 DIAGNOSIS — K5790 Diverticulosis of intestine, part unspecified, without perforation or abscess without bleeding: Secondary | ICD-10-CM | POA: Diagnosis not present

## 2020-04-21 DIAGNOSIS — E7800 Pure hypercholesterolemia, unspecified: Secondary | ICD-10-CM | POA: Insufficient documentation

## 2020-04-21 NOTE — Progress Notes (Signed)
Primary Care Physician: [name], MD Referring Physician: Dr. Wynell Balloon is a 69 y.o. male with a h/o paroxysmal afib that underwent afib ablation x 4, last one one month ago. He has not noted any afib, just a rare flutter. No indigestion or swallowing issues. No groin issues. He feels great.   NSRToday, he denies symptoms of palpitations, chest pain, shortness of breath, orthopnea, PND, lower extremity edema, dizziness, presyncope, syncope, or neurologic sequela. The patient is tolerating medications without difficulties and is otherwise without complaint today.   Past Medical History:  Diagnosis Date  . Atrial fibrillation ([place])    paroxysmal s/p PVI by JA 2010, 2014, 2017  . Diverticulosis    Colonoscopy 07/2013  . Herpes   . History of hepatitis B   . Hypercholesterolemia   . Varicose veins    Past Surgical History:  Procedure Laterality Date  . atrail fibrillation ablation  7/10, 1//17/14   PVI by JA  . ATRIAL FIBRILLATION ABLATION N/A 10/29/2012   Procedure: ATRIAL FIBRILLATION ABLATION;  Surgeon: Thompson Grayer, MD;  Location: [facility] CATH LAB;  Service: Cardiovascular;  Laterality: N/A;  . ATRIAL FIBRILLATION ABLATION N/A 03/24/2020   Procedure: ATRIAL FIBRILLATION ABLATION;  Surgeon: Thompson Grayer, MD;  Location: [place] CV LAB;  Service: Cardiovascular;  Laterality: N/A;  . CARDIOVERSION N/A 09/20/2015   Procedure: CARDIOVERSION;  Surgeon: Thayer Headings, MD;  Location: [facility] ENDOSCOPY;  Service: Cardiovascular;  Laterality: N/A;  . CARDIOVERSION N/A 09/28/2015   Procedure: CARDIOVERSION;  Surgeon: Lelon Perla, MD;  Location: [place];  Service: Cardiovascular;  Laterality: N/A;  . COLONOSCOPY  2004  . ELECTROPHYSIOLOGIC STUDY N/A 11/28/2015   3rd AF ablation by Dr Rayann Heman  . implantable loop recorder placement  10/11/2019   Medtronic Reveal Linq model LNQ22 (SN RLB [id number]) by Dr Rayann Heman for afib management post ablation  . SKIN BIOPSY Right  11/03/2018  . TEE WITHOUT CARDIOVERSION  10/29/2012   Procedure: TRANSESOPHAGEAL ECHOCARDIOGRAM (TEE);  Surgeon: Fay Records, MD;  Location: [facility] ENDOSCOPY;  Service: Cardiovascular;  Laterality: N/A;  pre ablation  . TEE WITHOUT CARDIOVERSION N/A 09/20/2015   Procedure: TRANSESOPHAGEAL ECHOCARDIOGRAM (TEE);  Surgeon: Thayer Headings, MD;  Location: [place];  Service: Cardiovascular;  Laterality: N/A;  . TONSILLECTOMY     when child    Current Outpatient Medications  Medication Sig Dispense Refill  . acyclovir (ZOVIRAX) 400 MG tablet TAKE 1 TABLET BY MOUTH THREE TIMES DAILY AS NEEDED AS DIRECTED 30 tablet 0  . atorvastatin (LIPITOR) 40 MG tablet TAKE 1 TABLET(40 MG) BY MOUTH DAILY (Patient taking differently: Take 40 mg by mouth daily. ) 90 tablet 3  . diltiazem (CARDIZEM) 30 MG tablet TAKE 1 TABLET BY MOUTH EVERY 4 HOURS AS NEEDED FOR HR GREATER THAN 100 45 tablet 2  . finasteride (PROSCAR) 5 MG tablet TAKE 1 TABLET(5 MG) BY MOUTH DAILY (Patient taking differently: Take 5 mg by mouth daily. ) 90 tablet 3  . meclizine (ANTIVERT) 25 MG tablet Take 25 mg by mouth 3 (three) times daily as needed for dizziness. Take as needed for lightheadedness/vertigo     . Multiple Vitamin (MULTI VITAMIN PO) Take 1 tablet by mouth daily.     Marland Kitchen omeprazole (PRILOSEC) 40 MG capsule Take 40 mg by mouth daily as needed (heartburn or acid reflux).    . rivaroxaban (XARELTO) 20 MG TABS tablet Take 1 tablet (20 mg total) by mouth daily with supper. 30 tablet  11   No current facility-administered medications for this encounter.    No Known Allergies  Social History   Socioeconomic History  . Marital status: Single    Spouse name: Not on file  . Number of children: Not on file  . Years of education: Not on file  . Highest education level: Not on file  Occupational History  . Occupation: owns yoga studio    Employer: [employer]  . Occupation: bookkeeper  Tobacco Use  . Smoking status: Never Smoker    . Smokeless tobacco: Never Used  . Tobacco comment: denies   Substance and Sexual Activity  . Alcohol use: Yes    Alcohol/week: 1.0 standard drink    Types: 1 Cans of beer per week  . Drug use: No  . Sexual activity: Yes  Other Topics Concern  . Not on file  Social History Narrative  . Not on file   Social Determinants of Health   Financial Resource Strain:   . Difficulty of Paying Living Expenses:   Food Insecurity:   . Worried About Charity fundraiser in the Last Year:   . Arboriculturist in the Last Year:   Transportation Needs:   . Film/video editor (Medical):   Marland Kitchen Lack of Transportation (Non-Medical):   Physical Activity:   . Days of Exercise per Week:   . Minutes of Exercise per Session:   Stress:   . Feeling of Stress :   Social Connections:   . Frequency of Communication with Friends and Family:   . Frequency of Social Gatherings with Friends and Family:   . Attends Religious Services:   . Active Member of Clubs or Organizations:   . Attends Archivist Meetings:   Marland Kitchen Marital Status:   Intimate Partner Violence:   . Fear of Current or Ex-Partner:   . Emotionally Abused:   Marland Kitchen Physically Abused:   . Sexually Abused:     Family History  Problem Relation Age of Onset  . Hypertension Father   . Hypertension Brother   . Hyperlipidemia Brother   . Stroke Mother     ROS- All systems are reviewed and negative except as per the HPI above  Physical Exam: Vitals:   04/21/20 0830  BP: 130/86  Pulse: 62  Weight: 81.4 kg  Height: 5\' 9"  (1.753 m)   Wt Readings from Last 3 Encounters:  04/21/20 81.4 kg  03/24/20 79.4 kg  03/06/20 81.7 kg    Labs: Lab Results  Component Value Date   NA 141 03/24/2020   K 4.1 03/24/2020   CL 110 03/24/2020   CO2 23 03/24/2020   GLUCOSE 97 03/24/2020   BUN 11 03/24/2020   CREATININE 0.90 03/24/2020   CALCIUM 8.9 03/24/2020   MG 2.5 10/10/2008   Lab Results  Component Value Date   INR 2.3 08/09/2009    Lab Results  Component Value Date   CHOL 178 03/06/2020   HDL 61 03/06/2020   LDLCALC 96 03/06/2020   TRIG 118 03/06/2020     GEN- The patient is well appearing, alert and oriented x 3 today.   Head- normocephalic, atraumatic Eyes-  Sclera clear, conjunctiva pink Ears- hearing intact Oropharynx- clear Neck- supple, no JVP Lymph- no cervical lymphadenopathy Lungs- Clear to ausculation bilaterally, normal work of breathing Heart- Regular rate and rhythm, no murmurs, rubs or gallops, PMI not laterally displaced GI- soft, NT, ND, + BS Extremities- no clubbing, cyanosis, or edema MS- no significant  deformity or atrophy Skin- no rash or lesion Psych- euthymic mood, full affect Neuro- strength and sensation are intact  EKG-NSR at 62 bpm, pr int 168 ms, qrs int 82 ms, qtc 420 ms     Assessment and Plan: 1. Paroxysmal afib  S/p 4th ablation  Doing great Continue  cardizem 30 mg as needed Continue Linq monitoring   2. CHA2DS2VASc score of 2 Continue xarelto 20 mg daily    F/u with Dr. Rayann Heman 9/13 afib clinic as needed  Butch Penny C. Luke Falero, [facility] [address] [place], [place] [address] [phone]

## 2020-05-18 ENCOUNTER — Ambulatory Visit (INDEPENDENT_AMBULATORY_CARE_PROVIDER_SITE_OTHER): Payer: Medicare Other | Admitting: *Deleted

## 2020-05-18 DIAGNOSIS — I480 Paroxysmal atrial fibrillation: Secondary | ICD-10-CM

## 2020-05-22 LAB — CUP PACEART REMOTE DEVICE CHECK
Date Time Interrogation Session: 20210808110600
Implantable Pulse Generator Implant Date: 20201228

## 2020-05-22 NOTE — Progress Notes (Signed)
Carelink Summary Report / Loop Recorder 

## 2020-06-21 ENCOUNTER — Ambulatory Visit: Payer: Medicare Other | Admitting: Internal Medicine

## 2020-06-23 ENCOUNTER — Ambulatory Visit (INDEPENDENT_AMBULATORY_CARE_PROVIDER_SITE_OTHER): Payer: Medicare Other | Admitting: *Deleted

## 2020-06-23 DIAGNOSIS — I480 Paroxysmal atrial fibrillation: Secondary | ICD-10-CM

## 2020-06-23 LAB — CUP PACEART REMOTE DEVICE CHECK
Date Time Interrogation Session: 20210910110535
Implantable Pulse Generator Implant Date: 20201228

## 2020-06-26 ENCOUNTER — Other Ambulatory Visit: Payer: Self-pay

## 2020-06-26 ENCOUNTER — Encounter: Payer: Self-pay | Admitting: Internal Medicine

## 2020-06-26 ENCOUNTER — Ambulatory Visit: Payer: Medicare Other | Admitting: Internal Medicine

## 2020-06-26 VITALS — BP 124/86 | HR 58 | Ht 69.0 in | Wt 179.2 lb

## 2020-06-26 DIAGNOSIS — E7800 Pure hypercholesterolemia, unspecified: Secondary | ICD-10-CM

## 2020-06-26 DIAGNOSIS — I480 Paroxysmal atrial fibrillation: Secondary | ICD-10-CM

## 2020-06-26 NOTE — Progress Notes (Signed)
PCP: [name], MD   Maurice [name] is a 69 y.o. male who presents today for routine electrophysiology followup.  Since his recent afib ablation, the patient reports doing very well.  he denies procedure related complications and is pleased with the results of the procedure.  Today, he denies symptoms of palpitations, chest pain, shortness of breath,  lower extremity edema, dizziness, presyncope, or syncope.  The patient is otherwise without complaint today.   Past Medical History:  Diagnosis Date  . Atrial fibrillation ([place])    paroxysmal s/p PVI by JA 2010, 2014, 2017  . Diverticulosis    Colonoscopy 07/2013  . Herpes   . History of hepatitis B   . Hypercholesterolemia   . Varicose veins    Past Surgical History:  Procedure Laterality Date  . atrail fibrillation ablation  7/10, 1//17/14   PVI by JA  . ATRIAL FIBRILLATION ABLATION N/A 10/29/2012   Procedure: ATRIAL FIBRILLATION ABLATION;  Surgeon: Thompson Grayer, MD;  Location: [facility] CATH LAB;  Service: Cardiovascular;  Laterality: N/A;  . ATRIAL FIBRILLATION ABLATION N/A 03/24/2020   Procedure: ATRIAL FIBRILLATION ABLATION;  Surgeon: Thompson Grayer, MD;  Location: [place] CV LAB;  Service: Cardiovascular;  Laterality: N/A;  . CARDIOVERSION N/A 09/20/2015   Procedure: CARDIOVERSION;  Surgeon: Thayer Headings, MD;  Location: [facility] ENDOSCOPY;  Service: Cardiovascular;  Laterality: N/A;  . CARDIOVERSION N/A 09/28/2015   Procedure: CARDIOVERSION;  Surgeon: Lelon Perla, MD;  Location: [place];  Service: Cardiovascular;  Laterality: N/A;  . COLONOSCOPY  2004  . ELECTROPHYSIOLOGIC STUDY N/A 11/28/2015   3rd AF ablation by Dr Rayann Heman  . implantable loop recorder placement  10/11/2019   Medtronic Reveal Linq model LNQ22 (SN RLB [id number]) by Dr Rayann Heman for afib management post ablation  . SKIN BIOPSY Right 11/03/2018  . TEE WITHOUT CARDIOVERSION  10/29/2012   Procedure: TRANSESOPHAGEAL ECHOCARDIOGRAM (TEE);  Surgeon: Fay Records, MD;  Location: [facility] ENDOSCOPY;  Service: Cardiovascular;  Laterality: N/A;  pre ablation  . TEE WITHOUT CARDIOVERSION N/A 09/20/2015   Procedure: TRANSESOPHAGEAL ECHOCARDIOGRAM (TEE);  Surgeon: Thayer Headings, MD;  Location: [facility] ENDOSCOPY;  Service: Cardiovascular;  Laterality: N/A;  . TONSILLECTOMY     when child    ROS- all systems are personally reviewed and negatives except as per HPI above  Current Outpatient Medications  Medication Sig Dispense Refill  . acyclovir (ZOVIRAX) 400 MG tablet TAKE 1 TABLET BY MOUTH THREE TIMES DAILY AS NEEDED AS DIRECTED 30 tablet 0  . atorvastatin (LIPITOR) 40 MG tablet TAKE 1 TABLET(40 MG) BY MOUTH DAILY 90 tablet 3  . diltiazem (CARDIZEM) 30 MG tablet TAKE 1 TABLET BY MOUTH EVERY 4 HOURS AS NEEDED FOR HR GREATER THAN 100 45 tablet 2  . finasteride (PROSCAR) 5 MG tablet TAKE 1 TABLET(5 MG) BY MOUTH DAILY 90 tablet 3  . meclizine (ANTIVERT) 25 MG tablet Take 25 mg by mouth 3 (three) times daily as needed for dizziness. Take as needed for lightheadedness/vertigo     . Multiple Vitamin (MULTI VITAMIN PO) Take 1 tablet by mouth daily.     Marland Kitchen omeprazole (PRILOSEC) 40 MG capsule Take 40 mg by mouth daily as needed (heartburn or acid reflux).    . rivaroxaban (XARELTO) 20 MG TABS tablet Take 1 tablet (20 mg total) by mouth daily with supper. 30 tablet 11   No current facility-administered medications for this visit.    Physical Exam: Vitals:   06/26/20 0844  BP: 124/86  Pulse: Marland Kitchen)  58  SpO2: 94%  Weight: 179 lb 3.2 oz (81.3 kg)  Height: 5\' 9"  (1.753 m)    GEN- The patient is well appearing, alert and oriented x 3 today.   Head- normocephalic, atraumatic Eyes-  Sclera clear, conjunctiva pink Ears- hearing intact Oropharynx- clear Lungs- Clear to ausculation bilaterally, normal work of breathing Heart- Regular rate and rhythm, no murmurs, rubs or gallops, PMI not laterally displaced GI- soft, NT, ND, + BS Extremities- no clubbing, cyanosis, or  edema  EKG tracing ordered today is personally reviewed and shows sinus with PACs  Echo 02/21/20 reviewed, EF 55%  Assessment and Plan:  1. Paroxysmal atrial fibrillation Doing well s/p ablation chads2vasc score is 1 afib burden is 0.2% by ILR which is improved post ablation He wishes to stop xarelto at this time  2. HL Continue atrovastatin   Return to see me in 4 months  Thompson Grayer MD, [facility] 06/26/2020 9:01 AM

## 2020-06-26 NOTE — Patient Instructions (Addendum)
Medication Instructions:  1 Stop your Xarelto  *If you need a refill on your cardiac medications before your next appointment, please call your pharmacy*  Lab Work: None ordered.  If you have labs (blood work) drawn today and your tests are completely normal, you will receive your results only by: Marland Kitchen MyChart Message (if you have MyChart) OR . A paper copy in the mail If you have any lab test that is abnormal or we need to change your treatment, we will call you to review the results.  Testing/Procedures: None ordered.  Follow-Up: At [facility], you and your health needs are our priority.  As part of our continuing mission to provide you with exceptional heart care, we have created designated Provider Care Teams.  These Care Teams include your primary Cardiologist (physician) and Advanced Practice Providers (APPs -  Physician Assistants and Nurse Practitioners) who all work together to provide you with the care you need, when you need it.  We recommend signing up for the patient portal called "MyChart".  Sign up information is provided on this After Visit Summary.  MyChart is used to connect with patients for Virtual Visits (Telemedicine).  Patients are able to view lab/test results, encounter notes, upcoming appointments, etc.  Non-urgent messages can be sent to your provider as well.   To learn more about what you can do with MyChart, go to [email or web address].    Your next appointment:  10/31/2019 at 9:30 am with Dr. Rayann Heman.   Other Instructions:

## 2020-06-26 NOTE — Progress Notes (Signed)
Carelink Summary Report / Loop Recorder 

## 2020-07-11 ENCOUNTER — Other Ambulatory Visit: Payer: Self-pay | Admitting: Family Medicine

## 2020-07-11 DIAGNOSIS — Z8619 Personal history of other infectious and parasitic diseases: Secondary | ICD-10-CM

## 2020-07-11 NOTE — Telephone Encounter (Signed)
[facility] is requesting to fill pt acyclovir. Please advise Kh

## 2020-07-26 ENCOUNTER — Ambulatory Visit (INDEPENDENT_AMBULATORY_CARE_PROVIDER_SITE_OTHER): Payer: Medicare Other

## 2020-07-26 DIAGNOSIS — I480 Paroxysmal atrial fibrillation: Secondary | ICD-10-CM | POA: Diagnosis not present

## 2020-07-27 DIAGNOSIS — H5203 Hypermetropia, bilateral: Secondary | ICD-10-CM | POA: Diagnosis not present

## 2020-07-28 LAB — CUP PACEART REMOTE DEVICE CHECK
Date Time Interrogation Session: 20211013110545
Implantable Pulse Generator Implant Date: 20201228

## 2020-07-31 NOTE — Progress Notes (Signed)
Carelink Summary Report / Loop Recorder 

## 2020-08-28 ENCOUNTER — Ambulatory Visit (INDEPENDENT_AMBULATORY_CARE_PROVIDER_SITE_OTHER): Payer: Medicare Other

## 2020-08-28 DIAGNOSIS — I480 Paroxysmal atrial fibrillation: Secondary | ICD-10-CM | POA: Diagnosis not present

## 2020-08-28 LAB — CUP PACEART REMOTE DEVICE CHECK
Date Time Interrogation Session: 20211115110557
Implantable Pulse Generator Implant Date: 20201228

## 2020-08-29 NOTE — Progress Notes (Signed)
Carelink Summary Report / Loop Recorder 

## 2020-09-21 ENCOUNTER — Other Ambulatory Visit: Payer: Self-pay | Admitting: Family Medicine

## 2020-09-21 DIAGNOSIS — Z8619 Personal history of other infectious and parasitic diseases: Secondary | ICD-10-CM

## 2020-09-21 NOTE — Telephone Encounter (Signed)
[facility] is requesting to fill pt acyclovir. Please advise KH 

## 2020-10-02 ENCOUNTER — Ambulatory Visit (INDEPENDENT_AMBULATORY_CARE_PROVIDER_SITE_OTHER): Payer: Medicare Other

## 2020-10-02 DIAGNOSIS — I480 Paroxysmal atrial fibrillation: Secondary | ICD-10-CM

## 2020-10-02 LAB — CUP PACEART REMOTE DEVICE CHECK
Date Time Interrogation Session: 20211218111050
Implantable Pulse Generator Implant Date: 20201228

## 2020-10-04 DIAGNOSIS — H5203 Hypermetropia, bilateral: Secondary | ICD-10-CM | POA: Diagnosis not present

## 2020-10-16 NOTE — Progress Notes (Signed)
Carelink Summary Report / Loop Recorder 

## 2020-10-21 DIAGNOSIS — Z1152 Encounter for screening for COVID-19: Secondary | ICD-10-CM | POA: Diagnosis not present

## 2020-10-30 ENCOUNTER — Encounter: Payer: Medicare Other | Admitting: Internal Medicine

## 2020-11-02 LAB — CUP PACEART REMOTE DEVICE CHECK
Date Time Interrogation Session: 20220120110608
Implantable Pulse Generator Implant Date: 20201228

## 2020-11-03 ENCOUNTER — Telehealth: Payer: Self-pay | Admitting: Emergency Medicine

## 2020-11-03 NOTE — Telephone Encounter (Signed)
Linq II transmission shows AF burden of 0.6%, 32 AF episodes that appear to be possible AF with RVR vs SVT. Patient reports he has had episodes that he felt " nervous" and his watch showed a HR > 100. He reports he has taken prn cardizem 30 mg on multiple occasions with increased HR and felt better. He reports he declines [place] at this time. Visit with Dr. Rayann Heman 11/13/20.

## 2020-11-06 ENCOUNTER — Ambulatory Visit (INDEPENDENT_AMBULATORY_CARE_PROVIDER_SITE_OTHER): Payer: Medicare Other

## 2020-11-06 DIAGNOSIS — I480 Paroxysmal atrial fibrillation: Secondary | ICD-10-CM

## 2020-11-13 ENCOUNTER — Encounter: Payer: Self-pay | Admitting: Internal Medicine

## 2020-11-13 ENCOUNTER — Ambulatory Visit: Payer: Medicare Other | Admitting: Internal Medicine

## 2020-11-13 ENCOUNTER — Other Ambulatory Visit: Payer: Self-pay

## 2020-11-13 VITALS — BP 122/72 | HR 60 | Ht 69.0 in | Wt 180.2 lb

## 2020-11-13 DIAGNOSIS — E7800 Pure hypercholesterolemia, unspecified: Secondary | ICD-10-CM

## 2020-11-13 DIAGNOSIS — I480 Paroxysmal atrial fibrillation: Secondary | ICD-10-CM | POA: Diagnosis not present

## 2020-11-13 LAB — CUP PACEART INCLINIC DEVICE CHECK
Date Time Interrogation Session: 20220131090348
Implantable Pulse Generator Implant Date: 20201228

## 2020-11-13 NOTE — Patient Instructions (Signed)
Medication Instructions:  Your physician recommends that you continue on your current medications as directed. Please refer to the Current Medication list given to you today.  *If you need a refill on your cardiac medications before your next appointment, please call your pharmacy*   Lab Work: None ordered If you have labs (blood work) drawn today and your tests are completely normal, you will receive your results only by: Marland Kitchen MyChart Message (if you have MyChart) OR . A paper copy in the mail If you have any lab test that is abnormal or we need to change your treatment, we will call you to review the results.   Testing/Procedures: None ordered   Follow-Up: At [facility], you and your health needs are our priority.  As part of our continuing mission to provide you with exceptional heart care, we have created designated Provider Care Teams.  These Care Teams include your primary Cardiologist (physician) and Advanced Practice Providers (APPs -  Physician Assistants and Nurse Practitioners) who all work together to provide you with the care you need, when you need it.  We recommend signing up for the patient portal called "MyChart".  Sign up information is provided on this After Visit Summary.  MyChart is used to connect with patients for Virtual Visits (Telemedicine).  Patients are able to view lab/test results, encounter notes, upcoming appointments, etc.  Non-urgent messages can be sent to your provider as well.   To learn more about what you can do with MyChart, go to [email or web address].    Your next appointment:   6 month(s)  The format for your next appointment:   In Person  Provider:  Your physician recommends that you schedule follow up in 6 months.     Thank you for choosing [facility]!!     Other Instructions

## 2020-11-13 NOTE — Progress Notes (Signed)
PCP: [name], MD   Primary EP: Maurice [name] is a 70 y.o. male who presents today for routine electrophysiology followup.  Since last being seen in our clinic, the patient reports doing very well.  He has rare palpitations of afib, mostly after spin class.  Today, he denies symptoms of chest pain, shortness of breath,  lower extremity edema, dizziness, presyncope, or syncope.  The patient is otherwise without complaint today.   Past Medical History:  Diagnosis Date  . Atrial fibrillation ([place])    paroxysmal s/p PVI by JA 2010, 2014, 2017  . Diverticulosis    Colonoscopy 07/2013  . Herpes   . History of hepatitis B   . Hypercholesterolemia   . Varicose veins    Past Surgical History:  Procedure Laterality Date  . atrail fibrillation ablation  7/10, 1//17/14   PVI by JA  . ATRIAL FIBRILLATION ABLATION N/A 10/29/2012   Procedure: ATRIAL FIBRILLATION ABLATION;  Surgeon: Thompson Grayer, MD;  Location: [facility] CATH LAB;  Service: Cardiovascular;  Laterality: N/A;  . ATRIAL FIBRILLATION ABLATION N/A 03/24/2020   Procedure: ATRIAL FIBRILLATION ABLATION;  Surgeon: Thompson Grayer, MD;  Location: [place] CV LAB;  Service: Cardiovascular;  Laterality: N/A;  . CARDIOVERSION N/A 09/20/2015   Procedure: CARDIOVERSION;  Surgeon: Thayer Headings, MD;  Location: [facility] ENDOSCOPY;  Service: Cardiovascular;  Laterality: N/A;  . CARDIOVERSION N/A 09/28/2015   Procedure: CARDIOVERSION;  Surgeon: Lelon Perla, MD;  Location: [place];  Service: Cardiovascular;  Laterality: N/A;  . COLONOSCOPY  2004  . ELECTROPHYSIOLOGIC STUDY N/A 11/28/2015   3rd AF ablation by Maurice Rayann Heman  . implantable loop recorder placement  10/11/2019   Medtronic Reveal Linq model LNQ22 (SN RLB [id number]) by Maurice Rayann Heman for afib management post ablation  . SKIN BIOPSY Right 11/03/2018  . TEE WITHOUT CARDIOVERSION  10/29/2012   Procedure: TRANSESOPHAGEAL ECHOCARDIOGRAM (TEE);  Surgeon: Fay Records, MD;  Location:  [facility] ENDOSCOPY;  Service: Cardiovascular;  Laterality: N/A;  pre ablation  . TEE WITHOUT CARDIOVERSION N/A 09/20/2015   Procedure: TRANSESOPHAGEAL ECHOCARDIOGRAM (TEE);  Surgeon: Thayer Headings, MD;  Location: [facility] ENDOSCOPY;  Service: Cardiovascular;  Laterality: N/A;  . TONSILLECTOMY     when child    ROS- all systems are reviewed and negatives except as per HPI above  Current Outpatient Medications  Medication Sig Dispense Refill  . acyclovir (ZOVIRAX) 400 MG tablet TAKE 1 TABLET BY MOUTH THREE TIMES DAILY AS NEEDED AS DIRECTED 30 tablet 0  . atorvastatin (LIPITOR) 40 MG tablet TAKE 1 TABLET(40 MG) BY MOUTH DAILY 90 tablet 3  . diltiazem (CARDIZEM) 30 MG tablet TAKE 1 TABLET BY MOUTH EVERY 4 HOURS AS NEEDED FOR HR GREATER THAN 100 45 tablet 2  . finasteride (PROSCAR) 5 MG tablet TAKE 1 TABLET(5 MG) BY MOUTH DAILY 90 tablet 3  . meclizine (ANTIVERT) 25 MG tablet Take 25 mg by mouth 3 (three) times daily as needed for dizziness. Take as needed for lightheadedness/vertigo     . Multiple Vitamin (MULTI VITAMIN PO) Take 1 tablet by mouth daily.     Marland Kitchen omeprazole (PRILOSEC) 40 MG capsule Take 40 mg by mouth daily as needed (heartburn or acid reflux).     No current facility-administered medications for this visit.    Physical Exam: Vitals:   11/13/20 0830  BP: 122/72  Pulse: 60  SpO2: 97%  Weight: 180 lb 3.2 oz (81.7 kg)  Height: 5\' 9"  (1.753 m)  GEN- The patient is well appearing, alert and oriented x 3 today.   Head- normocephalic, atraumatic Eyes-  Sclera clear, conjunctiva pink Ears- hearing intact Oropharynx- clear Lungs- Clear to ausculation bilaterally, normal work of breathing Heart- Regular rate and rhythm, no murmurs, rubs or gallops, PMI not laterally displaced GI- soft, NT, ND, + BS Extremities- no clubbing, cyanosis, or edema  Wt Readings from Last 3 Encounters:  11/13/20 180 lb 3.2 oz (81.7 kg)  06/26/20 179 lb 3.2 oz (81.3 kg)  04/21/20 179 lb 6.4 oz (81.4 kg)     EKG tracing ordered today is personally reviewed and shows sinus  Assessment and Plan:  1. Paroxysmal atrial fibrillation Doing well s/p ablation afib burden by ILR is 2% chads2vasc score is 1.  He does not require [place]  2. HL He is on atorvastatin  Risks, benefits and potential toxicities for medications prescribed and/or refilled reviewed with patient today.   Thompson Grayer MD, [facility] 11/13/2020 8:53 AM

## 2020-11-16 NOTE — Progress Notes (Signed)
Carelink Summary Report / Loop Recorder 

## 2020-12-07 LAB — CUP PACEART REMOTE DEVICE CHECK
Date Time Interrogation Session: 20220222110802
Implantable Pulse Generator Implant Date: 20201228

## 2020-12-11 ENCOUNTER — Ambulatory Visit (INDEPENDENT_AMBULATORY_CARE_PROVIDER_SITE_OTHER): Payer: Medicare Other

## 2020-12-11 DIAGNOSIS — I480 Paroxysmal atrial fibrillation: Secondary | ICD-10-CM

## 2020-12-17 ENCOUNTER — Other Ambulatory Visit: Payer: Self-pay | Admitting: Family Medicine

## 2020-12-17 DIAGNOSIS — E785 Hyperlipidemia, unspecified: Secondary | ICD-10-CM

## 2020-12-17 DIAGNOSIS — L649 Androgenic alopecia, unspecified: Secondary | ICD-10-CM

## 2020-12-18 NOTE — Progress Notes (Signed)
Carelink Summary Report / Loop Recorder 

## 2020-12-22 ENCOUNTER — Other Ambulatory Visit (HOSPITAL_COMMUNITY): Payer: Self-pay | Admitting: Internal Medicine

## 2021-01-08 ENCOUNTER — Ambulatory Visit (INDEPENDENT_AMBULATORY_CARE_PROVIDER_SITE_OTHER): Payer: Medicare Other

## 2021-01-08 DIAGNOSIS — I480 Paroxysmal atrial fibrillation: Secondary | ICD-10-CM

## 2021-01-08 LAB — CUP PACEART REMOTE DEVICE CHECK
Date Time Interrogation Session: 20220328072517
Implantable Pulse Generator Implant Date: 20201228

## 2021-01-11 ENCOUNTER — Other Ambulatory Visit: Payer: Self-pay

## 2021-01-11 ENCOUNTER — Encounter: Payer: Self-pay | Admitting: Physician Assistant

## 2021-01-11 ENCOUNTER — Ambulatory Visit: Payer: Medicare Other | Admitting: Physician Assistant

## 2021-01-11 DIAGNOSIS — L814 Other melanin hyperpigmentation: Secondary | ICD-10-CM

## 2021-01-11 DIAGNOSIS — L570 Actinic keratosis: Secondary | ICD-10-CM | POA: Diagnosis not present

## 2021-01-11 DIAGNOSIS — Z1283 Encounter for screening for malignant neoplasm of skin: Secondary | ICD-10-CM | POA: Diagnosis not present

## 2021-01-11 DIAGNOSIS — L820 Inflamed seborrheic keratosis: Secondary | ICD-10-CM

## 2021-01-11 DIAGNOSIS — L578 Other skin changes due to chronic exposure to nonionizing radiation: Secondary | ICD-10-CM

## 2021-01-11 DIAGNOSIS — Z85828 Personal history of other malignant neoplasm of skin: Secondary | ICD-10-CM

## 2021-01-11 DIAGNOSIS — Z86007 Personal history of in-situ neoplasm of skin: Secondary | ICD-10-CM | POA: Diagnosis not present

## 2021-01-11 DIAGNOSIS — D1800 Hemangioma unspecified site: Secondary | ICD-10-CM

## 2021-01-11 NOTE — Progress Notes (Signed)
   Follow-Up Visit   Subjective  Maurice [name] is a 70 y.o. male who presents for the following: Skin Problem (Here for LN2- no real concerns).   The following portions of the chart were reviewed this encounter and updated as appropriate:      Objective  Well appearing patient in no apparent distress; mood and affect are within normal limits.  A full examination was performed including scalp, head, eyes, ears, nose, lips, neck, chest, axillae, abdomen, back, buttocks, bilateral upper extremities, bilateral lower extremities, hands, feet, fingers, toes, fingernails, and toenails. All findings within normal limits unless otherwise noted below.  Objective  Left Lower Back, Mid Back, Right Lower Back: Erythematous patches with gritty scale.  Objective  Chest - Medial (Center) (5), Left Lower Leg - Anterior (7), Left Thigh - Anterior, Right Lower Leg - Anterior (7), Right Thigh - Anterior (3): Erythematous stuck-on, waxy papule or plaque.   Objective  Head -to toe: No atypical nevi or signs of NMSC noted at the time of the visit.   Objective  Right Thumb: Clear scar  Objective  Right Nare: Scar clear   Assessment & Plan  AK (actinic keratosis) (3) Left Lower Back; Right Lower Back; Mid Back  Destruction of lesion - Left Lower Back, Mid Back, Right Lower Back Complexity: simple   Destruction method: cryotherapy   Informed consent: discussed and consent obtained   Timeout:  patient name, date of birth, surgical site, and procedure verified Lesion destroyed using liquid nitrogen: Yes   Cryotherapy cycles:  3 Outcome: patient tolerated procedure well with no complications   Post-procedure details: wound care instructions given    Inflamed seborrheic keratosis (23) Left Thigh - Anterior; Right Thigh - Anterior (3); Left Lower Leg - Anterior (7); Right Lower Leg - Anterior (7); Chest - Medial (Center) (5)  Destruction of lesion - Chest - Medial (Center), Left Lower Leg  - Anterior, Left Thigh - Anterior, Right Lower Leg - Anterior, Right Thigh - Anterior Complexity: simple   Destruction method: cryotherapy   Informed consent: discussed and consent obtained   Timeout:  patient name, date of birth, surgical site, and procedure verified Lesion destroyed using liquid nitrogen: Yes   Cryotherapy cycles:  3 Outcome: patient tolerated procedure well with no complications   Post-procedure details: wound care instructions given    Skin exam for malignant neoplasm Head -to toe  Yearly skin check  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  - Call for new or changing lesions.     I, Laquisha Northcraft, PA-C, have reviewed all documentation's for this visit.  The documentation on 01/11/21 for the exam, diagnosis, procedures and orders are all accurate and complete.

## 2021-01-17 NOTE — Progress Notes (Signed)
Carelink Summary Report / Loop Recorder 

## 2021-02-12 ENCOUNTER — Ambulatory Visit (INDEPENDENT_AMBULATORY_CARE_PROVIDER_SITE_OTHER): Payer: Medicare Other

## 2021-02-12 DIAGNOSIS — I480 Paroxysmal atrial fibrillation: Secondary | ICD-10-CM | POA: Diagnosis not present

## 2021-02-13 LAB — CUP PACEART REMOTE DEVICE CHECK
Date Time Interrogation Session: 20220503123359
Implantable Pulse Generator Implant Date: 20201228

## 2021-02-22 ENCOUNTER — Other Ambulatory Visit: Payer: Self-pay | Admitting: Family Medicine

## 2021-02-22 DIAGNOSIS — Z8619 Personal history of other infectious and parasitic diseases: Secondary | ICD-10-CM

## 2021-02-22 NOTE — Telephone Encounter (Signed)
[facility] is requesting to fill pt acyclovir. Please advise KH 

## 2021-03-02 NOTE — Progress Notes (Signed)
Carelink Summary Report / Loop Recorder 

## 2021-03-07 ENCOUNTER — Encounter: Payer: Self-pay | Admitting: Family Medicine

## 2021-03-07 ENCOUNTER — Ambulatory Visit (INDEPENDENT_AMBULATORY_CARE_PROVIDER_SITE_OTHER): Payer: Medicare Other | Admitting: Family Medicine

## 2021-03-07 ENCOUNTER — Other Ambulatory Visit: Payer: Self-pay

## 2021-03-07 VITALS — BP 128/80 | HR 61 | Temp 96.0°F | Ht 67.0 in | Wt 174.0 lb

## 2021-03-07 DIAGNOSIS — K219 Gastro-esophageal reflux disease without esophagitis: Secondary | ICD-10-CM

## 2021-03-07 DIAGNOSIS — E7800 Pure hypercholesterolemia, unspecified: Secondary | ICD-10-CM

## 2021-03-07 DIAGNOSIS — I480 Paroxysmal atrial fibrillation: Secondary | ICD-10-CM | POA: Diagnosis not present

## 2021-03-07 DIAGNOSIS — Z8042 Family history of malignant neoplasm of prostate: Secondary | ICD-10-CM

## 2021-03-07 DIAGNOSIS — D6869 Other thrombophilia: Secondary | ICD-10-CM | POA: Diagnosis not present

## 2021-03-07 DIAGNOSIS — I10 Essential (primary) hypertension: Secondary | ICD-10-CM | POA: Diagnosis not present

## 2021-03-07 DIAGNOSIS — Z0000 Encounter for general adult medical examination without abnormal findings: Secondary | ICD-10-CM

## 2021-03-07 DIAGNOSIS — Z8619 Personal history of other infectious and parasitic diseases: Secondary | ICD-10-CM

## 2021-03-07 DIAGNOSIS — L649 Androgenic alopecia, unspecified: Secondary | ICD-10-CM

## 2021-03-07 DIAGNOSIS — E785 Hyperlipidemia, unspecified: Secondary | ICD-10-CM

## 2021-03-07 DIAGNOSIS — Z125 Encounter for screening for malignant neoplasm of prostate: Secondary | ICD-10-CM

## 2021-03-07 DIAGNOSIS — Z86010 Personal history of colonic polyps: Secondary | ICD-10-CM

## 2021-03-07 MED ORDER — FINASTERIDE 5 MG PO TABS: ORAL_TABLET | ORAL | 3 refills | Status: DC

## 2021-03-07 MED ORDER — ATORVASTATIN CALCIUM 40 MG PO TABS: ORAL_TABLET | ORAL | 3 refills | Status: DC

## 2021-03-07 NOTE — Progress Notes (Signed)
   Subjective:    Patient ID: Maurice [name], male    DOB: 1951-04-07, 70 y.o.   MRN: [id number]  HPI He is here for complete examination.  He is followed regularly by cardiology for his PAF.  He has had 4 ablations and still has breakthroughs.  He does use Cardizem for that.  Presently he is not on chronic anticoagulation as his CHADS2 score is not high enough.  Continues on atorvastatin for his lipids.  Does occasionally have difficulty with herpes labialis this is Zovirax on an as-needed basis.  He does have reflux disease but rarely needs his Prilosec.  Does have a history of colonic polyps and is on a 5-year schedule.  There is also family history of prostate cancer so routine PSA is usually done.  He continues on finasteride for the alopecia.  Otherwise his family and social history as well as health maintenance and immunizations was reviewed.  He does keep himself physically active.   Review of Systems  All other systems reviewed and are negative.      Objective:   Physical Exam Alert and in no distress. Tympanic membranes and canals are normal. Pharyngeal area is normal. Neck is supple without adenopathy or thyromegaly. Cardiac exam shows a regular sinus rhythm without murmurs or gallops. Lungs are clear to auscultation. Abdominal exam shows no masses or tenderness with normal bowel sounds      Assessment & Plan:  Routine general medical examination at a health care facility - Plan: CBC with Differential/Platelet, Comprehensive metabolic panel, Lipid panel  Secondary hypercoagulable state ([place])  Paroxysmal atrial fibrillation ([place]) - Plan: CBC with Differential/Platelet, Comprehensive metabolic panel  Male pattern baldness - Plan: finasteride (PROSCAR) 5 MG tablet  Pure hypercholesterolemia - Plan: Lipid panel  Gastroesophageal reflux disease without esophagitis  History of colonic polyps  Essential hypertension, benign - Plan: CBC with Differential/Platelet,  Comprehensive metabolic panel  Family history of prostate cancer - Plan: PSA  Screening for prostate cancer - Plan: PSA  History of herpes labialis  Hyperlipidemia, unspecified hyperlipidemia type - Plan: atorvastatin (LIPITOR) 40 MG tablet Encouraged him to continue to take good care of himself.  When he needs a refill on his Zovirax, he will call me.  Continue to follow-up with cardiology and GI as regularly scheduled.

## 2021-03-08 LAB — COMPREHENSIVE METABOLIC PANEL
ALT: 28 IU/L (ref 0–44)
AST: 22 IU/L (ref 0–40)
Albumin/Globulin Ratio: 2.1 (ref 1.2–2.2)
Albumin: 4.7 g/dL (ref 3.8–4.8)
Alkaline Phosphatase: 85 IU/L (ref 44–121)
BUN/Creatinine Ratio: 18 (ref 10–24)
BUN: 17 mg/dL (ref 8–27)
Bilirubin Total: 1 mg/dL (ref 0.0–1.2)
CO2: 22 mmol/L (ref 20–29)
Calcium: 9.7 mg/dL (ref 8.6–10.2)
Chloride: 104 mmol/L (ref 96–106)
Creatinine, Ser: 0.93 mg/dL (ref 0.76–1.27)
Globulin, Total: 2.2 g/dL (ref 1.5–4.5)
Glucose: 95 mg/dL (ref 65–99)
Potassium: 4.5 mmol/L (ref 3.5–5.2)
Sodium: 141 mmol/L (ref 134–144)
Total Protein: 6.9 g/dL (ref 6.0–8.5)
eGFR: 89 mL/min/{1.73_m2} (ref >59)

## 2021-03-08 LAB — CBC WITH DIFFERENTIAL/PLATELET
Basophils Absolute: 0 10*3/uL (ref 0.0–0.2)
Basos: 0 %
EOS (ABSOLUTE): 0.1 10*3/uL (ref 0.0–0.4)
Eos: 2 %
Hematocrit: 49.3 % (ref 37.5–51.0)
Hemoglobin: 16.6 g/dL (ref 13.0–17.7)
Immature Grans (Abs): 0 10*3/uL (ref 0.0–0.1)
Immature Granulocytes: 0 %
Lymphocytes Absolute: 2 10*3/uL (ref 0.7–3.1)
Lymphs: 41 %
MCH: 29.4 pg (ref 26.6–33.0)
MCHC: 33.7 g/dL (ref 31.5–35.7)
MCV: 87 fL (ref 79–97)
Monocytes Absolute: 0.5 10*3/uL (ref 0.1–0.9)
Monocytes: 9 %
Neutrophils Absolute: 2.4 10*3/uL (ref 1.4–7.0)
Neutrophils: 48 %
Platelets: 199 10*3/uL (ref 150–450)
RBC: 5.65 x10E6/uL (ref 4.14–5.80)
RDW: 12.9 % (ref 11.6–15.4)
WBC: 5 10*3/uL (ref 3.4–10.8)

## 2021-03-08 LAB — LIPID PANEL
Chol/HDL Ratio: 4 ratio (ref 0.0–5.0)
Cholesterol, Total: 183 mg/dL (ref 100–199)
HDL: 46 mg/dL (ref >39)
LDL Chol Calc (NIH): 112 mg/dL — ABNORMAL HIGH (ref 0–99)
Triglycerides: 140 mg/dL (ref 0–149)
VLDL Cholesterol Cal: 25 mg/dL (ref 5–40)

## 2021-03-08 LAB — PSA: Prostate Specific Ag, Serum: 0.2 ng/mL (ref 0.0–4.0)

## 2021-03-15 ENCOUNTER — Encounter: Payer: Self-pay | Admitting: Family Medicine

## 2021-03-15 ENCOUNTER — Telehealth (INDEPENDENT_AMBULATORY_CARE_PROVIDER_SITE_OTHER): Payer: Medicare Other | Admitting: Family Medicine

## 2021-03-15 ENCOUNTER — Other Ambulatory Visit: Payer: Self-pay

## 2021-03-15 VITALS — BP 128/80 | Wt 174.0 lb

## 2021-03-15 DIAGNOSIS — E7800 Pure hypercholesterolemia, unspecified: Secondary | ICD-10-CM

## 2021-03-15 DIAGNOSIS — I480 Paroxysmal atrial fibrillation: Secondary | ICD-10-CM

## 2021-03-15 DIAGNOSIS — Z0000 Encounter for general adult medical examination without abnormal findings: Secondary | ICD-10-CM | POA: Diagnosis not present

## 2021-03-15 NOTE — Progress Notes (Addendum)
Maurice [name] is a 70 y.o. male who presents for annual wellness visit .  :  He follows up regularly with cardiology and presently is having no difficulties.  His medications were reviewed and he is staying on all of them and having no difficulty with that. Documentation for virtual audio and video telecommunications through [place] encounter: The patient was located at home. 2 patient identifiers used.  The provider was located in the office. The patient did consent to this visit and is aware of possible charges through their insurance for this visit. The other persons participating in this telemedicine service were none. Time spent on call was 10 minutes and in review of previous records >25 minutes total for counseling and coordination of care. This virtual service is not related to other E/M service within previous 7 days.  Immunizations and Health Maintenance Immunization History  Administered Date(s) Administered  . Fluad Quad(high Dose 65+) 07/15/2019  . Hepatitis A 02/15/2009  . IPV 10/14/1962  . Influenza Split 07/23/2013, 07/22/2015  . Influenza, High Dose Seasonal PF 07/25/2017, 09/25/2018  . Influenza-Unspecified 07/28/2014, 07/26/2016  . MMR 10/15/1959, 09/23/1988  . Meningococcal Polysaccharide 07/23/2013  . PFIZER(Purple Top)SARS-COV-2 Vaccination 11/18/2019, 12/09/2019, 05/08/2020, 02/22/2021  . Pneumococcal Conjugate-13 11/01/2016  . Pneumococcal Polysaccharide-23 11/13/2017  . Tdap 07/30/2007, 11/03/2016  . Zoster Recombinat (Shingrix) 03/17/2018, 05/19/2018  . Zoster, Live 05/18/2013   There are no preventive care reminders to display for this patient.  Last colonoscopy: 07/06/18 Q five years Last PSA: 03/07/21 Dentist: Q six months Ophtho: Q year Exercise: spin class three days a week   Other doctors caring for patient include:  Dr. Rayann Heman cardiology, [place] , [place] dermatology.   Advanced Directives: Does Patient Have a Medical Advance Directive?:  Yes Type of Advance Directive: Living will Does patient want to make changes to medical advance directive?: No - Patient declined  Depression screen:  See questionnaire below.     Depression screen [facility] 2/9 03/15/2021 03/07/2021 03/06/2020 02/26/2019 11/13/2017  Decreased Interest 0 0 0 0 0  Down, Depressed, Hopeless 0 0 0 0 0  PHQ - 2 Score 0 0 0 0 0    Fall Screen: See Questionaire below.   Fall Risk  03/15/2021 03/06/2020 02/26/2019 11/13/2017 11/01/2016  Falls in the past year? 0 0 0 No No  Number falls in past yr: 0 - - - -  Injury with Fall? 1 - - - -  Risk for fall due to : No Fall Risks - - - -  Risk for fall due to: Comment - - - - -  Follow up Falls evaluation completed - - - -    ADL screen:  See questionnaire below.  Functional Status Survey: Is the patient deaf or have difficulty hearing?: No Does the patient have difficulty seeing, even when wearing glasses/contacts?: No Does the patient have difficulty concentrating, remembering, or making decisions?: No Does the patient have difficulty walking or climbing stairs?: No Does the patient have difficulty dressing or bathing?: No Does the patient have difficulty doing errands alone such as visiting a doctor's office or shopping?: No   Review of Systems     PHYSICAL EXAM:  BP 128/80   Wt 174 lb (78.9 kg)   BMI 27.25 kg/m   General Appearance: Alert, cooperative, no distress, appears stated age t   Psych: Normal mood, affect, hygiene and grooming  ASSESSMENT/PLAN: Paroxysmal atrial fibrillation ([place])  Pure hypercholesterolemia     Discussed PSA screening (risks/benefits), recommended at least  30 minutes of aerobic activity at least 5 days/week;Immunization recommendations discussed.  Colonoscopy recommendations reviewed.   Medicare Attestation I have personally reviewed: The patient's medical and social history Their use of alcohol, tobacco or illicit drugs Their current medications and supplements The  patient's functional ability including ADLs,fall risks, home safety risks, cognitive, and hearing and visual impairment Diet and physical activities Evidence for depression or mood disorders  The patient's weight, height, and BMI have been recorded in the chart.  I have made referrals, counseling, and provided education to the patient based on review of the above and I have provided the patient with a written personalized care plan for preventive services.     [name], MD   03/15/2021

## 2021-03-15 NOTE — Patient Instructions (Signed)
  [name] , Thank you for taking time to come for your Medicare Wellness Visit. I appreciate your ongoing commitment to your health goals. Please review the following plan we discussed and let me know if I can assist you in the future.   These are the goals we discussed: Continue to take excellent care of yourself. This is a list of the screening recommended for you and due dates:  Health Maintenance  Topic Date Due  . Flu Shot  05/14/2021  . Tetanus Vaccine  11/03/2026  . Colon Cancer Screening  07/06/2028  . COVID-19 Vaccine  Completed  . Hepatitis C Screening: USPSTF Recommendation to screen - Ages 66-79 yo.  Completed  . Pneumonia vaccines  Completed  . Zoster (Shingles) Vaccine  Completed  . HPV Vaccine  Aged Out

## 2021-03-19 ENCOUNTER — Ambulatory Visit (INDEPENDENT_AMBULATORY_CARE_PROVIDER_SITE_OTHER): Payer: Medicare Other

## 2021-03-19 DIAGNOSIS — I480 Paroxysmal atrial fibrillation: Secondary | ICD-10-CM | POA: Diagnosis not present

## 2021-03-20 LAB — CUP PACEART REMOTE DEVICE CHECK
Date Time Interrogation Session: 20220606091443
Implantable Pulse Generator Implant Date: 20201228

## 2021-04-09 NOTE — Progress Notes (Signed)
Carelink Summary Report / Loop Recorder 

## 2021-04-23 ENCOUNTER — Ambulatory Visit (INDEPENDENT_AMBULATORY_CARE_PROVIDER_SITE_OTHER): Payer: Medicare Other

## 2021-04-23 DIAGNOSIS — I4891 Unspecified atrial fibrillation: Secondary | ICD-10-CM

## 2021-04-24 LAB — CUP PACEART REMOTE DEVICE CHECK
Date Time Interrogation Session: 20220705124925
Implantable Pulse Generator Implant Date: 20201228

## 2021-05-14 ENCOUNTER — Ambulatory Visit: Payer: Medicare Other | Admitting: Internal Medicine

## 2021-05-14 ENCOUNTER — Other Ambulatory Visit: Payer: Self-pay

## 2021-05-14 ENCOUNTER — Encounter: Payer: Self-pay | Admitting: Internal Medicine

## 2021-05-14 VITALS — BP 132/86 | HR 61 | Ht 69.0 in | Wt 177.2 lb

## 2021-05-14 DIAGNOSIS — E7800 Pure hypercholesterolemia, unspecified: Secondary | ICD-10-CM | POA: Diagnosis not present

## 2021-05-14 DIAGNOSIS — I480 Paroxysmal atrial fibrillation: Secondary | ICD-10-CM | POA: Diagnosis not present

## 2021-05-14 NOTE — Patient Instructions (Signed)
Medication Instructions:  Your physician recommends that you continue on your current medications as directed. Please refer to the Current Medication list given to you today.  Labwork: None ordered.  Testing/Procedures: None ordered.  Follow-Up: Your physician wants you to follow-up in: 6 month in the Afib Clinic. They will contact you to schedule.    Any Other Special Instructions Will Be Listed Below (If Applicable).  If you need a refill on your cardiac medications before your next appointment, please call your pharmacy.

## 2021-05-14 NOTE — Progress Notes (Signed)
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  Since last being seen in our clinic, the patient reports doing very well.  Today, he denies symptoms of palpitations, chest pain, shortness of breath,  lower extremity edema, dizziness, presyncope, or syncope.  The patient is otherwise without complaint today.   Past Medical History:  Diagnosis Date   Atrial fibrillation ([place])    paroxysmal s/p PVI by JA 2010, 2014, 2017   Diverticulosis    Colonoscopy 07/2013   Herpes    History of hepatitis B    Hypercholesterolemia    Varicose veins    Past Surgical History:  Procedure Laterality Date   atrail fibrillation ablation  7/10, 1//17/14   PVI by [place] N/A 10/29/2012   Procedure: ATRIAL FIBRILLATION ABLATION;  Surgeon: Thompson Grayer, MD;  Location: [facility] CATH LAB;  Service: Cardiovascular;  Laterality: N/A;   ATRIAL FIBRILLATION ABLATION N/A 03/24/2020   Procedure: ATRIAL FIBRILLATION ABLATION;  Surgeon: Thompson Grayer, MD;  Location: [place] CV LAB;  Service: Cardiovascular;  Laterality: N/A;   CARDIOVERSION N/A 09/20/2015   Procedure: CARDIOVERSION;  Surgeon: Thayer Headings, MD;  Location: [place];  Service: Cardiovascular;  Laterality: N/A;   CARDIOVERSION N/A 09/28/2015   Procedure: CARDIOVERSION;  Surgeon: Lelon Perla, MD;  Location: [place];  Service: Cardiovascular;  Laterality: N/A;   COLONOSCOPY  2004   ELECTROPHYSIOLOGIC STUDY N/A 11/28/2015   3rd AF ablation by Dr Rayann Heman   implantable loop recorder placement  10/11/2019   Medtronic Reveal [place] model LNQ22 (SN [id number]) by Dr Rayann Heman for afib management post ablation   SKIN BIOPSY Right 11/03/2018   TEE WITHOUT CARDIOVERSION  10/29/2012   Procedure: TRANSESOPHAGEAL ECHOCARDIOGRAM (TEE);  Surgeon: Fay Records, MD;  Location: [facility] ENDOSCOPY;  Service: Cardiovascular;  Laterality: N/A;  pre  ablation   TEE WITHOUT CARDIOVERSION N/A 09/20/2015   Procedure: TRANSESOPHAGEAL ECHOCARDIOGRAM (TEE);  Surgeon: Thayer Headings, MD;  Location: [facility] ENDOSCOPY;  Service: Cardiovascular;  Laterality: N/A;   TONSILLECTOMY     when child    ROS- all systems are reviewed and negatives except as per HPI above  Current Outpatient Medications  Medication Sig Dispense Refill   acyclovir (ZOVIRAX) 400 MG tablet TAKE 1 TABLET BY MOUTH THREE TIMES DAILY AS NEEDED AS DIRECTED 30 tablet 0   atorvastatin (LIPITOR) 40 MG tablet TAKE 1 TABLET(40 MG) BY MOUTH DAILY 90 tablet 3   diltiazem (CARDIZEM) 30 MG tablet TAKE 1 TABLET BY MOUTH E 4 HOURS AS NEEDED FOR HR GREATER THAN 100 45 tablet 3   finasteride (PROSCAR) 5 MG tablet TAKE 1 TABLET(5 MG) BY MOUTH DAILY 90 tablet 3   meclizine (ANTIVERT) 25 MG tablet Take 25 mg by mouth 3 (three) times daily as needed for dizziness. Take as needed for lightheadedness/vertigo     Multiple Vitamin (MULTI VITAMIN PO) Take 1 tablet by mouth daily.      omeprazole (PRILOSEC) 40 MG capsule Take 40 mg by mouth daily as needed (heartburn or acid reflux).     No current facility-administered medications for this visit.    Physical Exam: Vitals:   05/14/21 0850  BP: 132/86  Pulse: 61  SpO2: 96%  Weight: 177 lb 3.2 oz (80.4 kg)  Height: '5\' 9"'$  (1.753 m)    GEN- The patient is well appearing, alert and oriented x 3  today.   Head- normocephalic, atraumatic Eyes-  Sclera clear, conjunctiva pink Ears- hearing intact Oropharynx- clear Lungs- Clear to ausculation bilaterally, normal work of breathing Heart- Regular rate and rhythm, no murmurs, rubs or gallops, PMI not laterally displaced GI- soft, NT, ND, + BS Extremities- no clubbing, cyanosis, or edema  Wt Readings from Last 3 Encounters:  05/14/21 177 lb 3.2 oz (80.4 kg)  03/15/21 174 lb (78.9 kg)  03/07/21 174 lb (78.9 kg)    EKG tracing ordered today is personally reviewed and shows sinus  Assessment and  Plan:  Paroxysmal atrial fibrillation Burden 1.9% by ILR (2% last visit) Chads2vasc score is 1.  Does not require [place].  2. HL Continue atorvastatin '40mg'$  daily  Risks, benefits and potential toxicities for medications prescribed and/or refilled reviewed with patient today.   Return in 6 months to see AF clinic  Thompson Grayer MD, [facility] 05/14/2021 9:02 AM

## 2021-05-15 NOTE — Progress Notes (Signed)
Carelink Summary Report / Loop Recorder 

## 2021-05-28 ENCOUNTER — Ambulatory Visit (INDEPENDENT_AMBULATORY_CARE_PROVIDER_SITE_OTHER): Payer: Medicare Other

## 2021-05-28 DIAGNOSIS — I480 Paroxysmal atrial fibrillation: Secondary | ICD-10-CM | POA: Diagnosis not present

## 2021-05-28 LAB — CUP PACEART REMOTE DEVICE CHECK
Date Time Interrogation Session: 20220815110847
Implantable Pulse Generator Implant Date: 20201228

## 2021-06-15 NOTE — Progress Notes (Signed)
Carelink Summary Report / Loop Recorder 

## 2021-06-21 ENCOUNTER — Other Ambulatory Visit: Payer: Self-pay | Admitting: Family Medicine

## 2021-06-21 DIAGNOSIS — Z8619 Personal history of other infectious and parasitic diseases: Secondary | ICD-10-CM

## 2021-06-21 NOTE — Telephone Encounter (Signed)
Is this okay to refill? 

## 2021-06-25 ENCOUNTER — Ambulatory Visit (INDEPENDENT_AMBULATORY_CARE_PROVIDER_SITE_OTHER): Payer: Medicare Other

## 2021-06-25 DIAGNOSIS — I480 Paroxysmal atrial fibrillation: Secondary | ICD-10-CM | POA: Diagnosis not present

## 2021-06-26 LAB — CUP PACEART REMOTE DEVICE CHECK
Date Time Interrogation Session: 20220912104752
Implantable Pulse Generator Implant Date: 20201228

## 2021-06-29 ENCOUNTER — Telehealth (HOSPITAL_COMMUNITY): Payer: Self-pay | Admitting: *Deleted

## 2021-06-29 MED ORDER — DILTIAZEM HCL ER COATED BEADS 120 MG PO CP24: 120.0000 mg | ORAL_CAPSULE | Freq: Every day | ORAL | 3 refills | Status: DC

## 2021-06-29 MED ORDER — RIVAROXABAN 20 MG PO TABS: 20.0000 mg | ORAL_TABLET | Freq: Every day | ORAL | 3 refills | Status: DC

## 2021-06-29 NOTE — Progress Notes (Signed)
Carelink Summary Report / Loop Recorder 

## 2021-06-29 NOTE — Telephone Encounter (Signed)
Patient called in stating his apple watch has been notifying him for the last several days regarding elevated HRs up to the 160s. He has taken several doses of cardizem '30mg'$  without much effect. Positive for dizziness/fatigue. Pulled up linq report he has had uptick in burden since beginning of September pt states he would have to agree with that assessment. Discussed with Adline Peals PA will start Xarelto '20mg'$  once a day and cardizem '120mg'$  once a day follow up Monday. Pt in agreement.

## 2021-07-02 ENCOUNTER — Ambulatory Visit (HOSPITAL_COMMUNITY)
Admission: RE | Admit: 2021-07-02 | Discharge: 2021-07-02 | Disposition: A | Discharge status: Home / Self Care | Payer: Medicare Other | Source: Ambulatory Visit | Attending: Physician Assistant | Admitting: Physician Assistant

## 2021-07-02 ENCOUNTER — Encounter (HOSPITAL_COMMUNITY): Payer: Self-pay | Admitting: Physician Assistant

## 2021-07-02 ENCOUNTER — Other Ambulatory Visit: Payer: Self-pay

## 2021-07-02 VITALS — BP 132/72 | HR 59 | Ht 69.0 in | Wt 179.2 lb

## 2021-07-02 DIAGNOSIS — I480 Paroxysmal atrial fibrillation: Secondary | ICD-10-CM | POA: Diagnosis not present

## 2021-07-02 DIAGNOSIS — Z79899 Other long term (current) drug therapy: Secondary | ICD-10-CM | POA: Insufficient documentation

## 2021-07-02 DIAGNOSIS — Z7901 Long term (current) use of anticoagulants: Secondary | ICD-10-CM | POA: Diagnosis not present

## 2021-07-02 NOTE — Progress Notes (Signed)
Primary Care Physician: [name], MD Referring Physician: Dr. Wynell Balloon is a 70 y.o. male with a h/o paroxysmal afib that underwent afib ablation x 4. Patient noted elevated heart rates for the past two weeks. ILR shows an increased afib burden since the end of August. ? If the device is undersensing atrial flutter. He was started on diltiazem and Xarelto on 06/29/21 and his heart racing has resolved. There were no specific triggers that he could identify.    NSRToday, he denies symptoms of palpitations, chest pain, shortness of breath, orthopnea, PND, lower extremity edema, dizziness, presyncope, syncope, or neurologic sequela. The patient is tolerating medications without difficulties and is otherwise without complaint today.   Past Medical History:  Diagnosis Date   Atrial fibrillation ([place])    paroxysmal s/p PVI by JA 2010, 2014, 2017   Diverticulosis    Colonoscopy 07/2013   Herpes    History of hepatitis B    Hypercholesterolemia    Varicose veins    Past Surgical History:  Procedure Laterality Date   atrail fibrillation ablation  7/10, 1//17/14   PVI by [place] N/A 10/29/2012   Procedure: ATRIAL FIBRILLATION ABLATION;  Surgeon: Thompson Grayer, MD;  Location: [facility] CATH LAB;  Service: Cardiovascular;  Laterality: N/A;   ATRIAL FIBRILLATION ABLATION N/A 03/24/2020   Procedure: ATRIAL FIBRILLATION ABLATION;  Surgeon: Thompson Grayer, MD;  Location: [place] CV LAB;  Service: Cardiovascular;  Laterality: N/A;   CARDIOVERSION N/A 09/20/2015   Procedure: CARDIOVERSION;  Surgeon: Thayer Headings, MD;  Location: [place];  Service: Cardiovascular;  Laterality: N/A;   CARDIOVERSION N/A 09/28/2015   Procedure: CARDIOVERSION;  Surgeon: Lelon Perla, MD;  Location: [place];  Service: Cardiovascular;  Laterality: N/A;   COLONOSCOPY  2004   ELECTROPHYSIOLOGIC STUDY N/A 11/28/2015   3rd AF ablation by Dr Rayann Heman   implantable loop  recorder placement  10/11/2019   Medtronic Reveal [place] model LNQ22 (SN RLB [id number]) by Dr Rayann Heman for afib management post ablation   SKIN BIOPSY Right 11/03/2018   TEE WITHOUT CARDIOVERSION  10/29/2012   Procedure: TRANSESOPHAGEAL ECHOCARDIOGRAM (TEE);  Surgeon: Fay Records, MD;  Location: [facility] ENDOSCOPY;  Service: Cardiovascular;  Laterality: N/A;  pre ablation   TEE WITHOUT CARDIOVERSION N/A 09/20/2015   Procedure: TRANSESOPHAGEAL ECHOCARDIOGRAM (TEE);  Surgeon: Thayer Headings, MD;  Location: [facility] ENDOSCOPY;  Service: Cardiovascular;  Laterality: N/A;   TONSILLECTOMY     when child    Current Outpatient Medications  Medication Sig Dispense Refill   acyclovir (ZOVIRAX) 400 MG tablet TAKE 1 TABLET BY MOUTH THREE TIMES DAILY AS NEEDED AS DIRECTED 30 tablet 0   atorvastatin (LIPITOR) 40 MG tablet TAKE 1 TABLET(40 MG) BY MOUTH DAILY 90 tablet 3   diltiazem (CARDIZEM CD) 120 MG 24 hr capsule Take 1 capsule (120 mg total) by mouth daily. 30 capsule 3   diltiazem (CARDIZEM) 30 MG tablet TAKE 1 TABLET BY MOUTH E 4 HOURS AS NEEDED FOR HR GREATER THAN 100 45 tablet 3   finasteride (PROSCAR) 5 MG tablet TAKE 1 TABLET(5 MG) BY MOUTH DAILY 90 tablet 3   meclizine (ANTIVERT) 25 MG tablet Take 25 mg by mouth 3 (three) times daily as needed for dizziness. Take as needed for lightheadedness/vertigo     Multiple Vitamin (MULTI VITAMIN PO) Take 1 tablet by mouth daily.      omeprazole (PRILOSEC) 40 MG capsule Take 40 mg by mouth daily  as needed (heartburn or acid reflux).     rivaroxaban (XARELTO) 20 MG TABS tablet Take 1 tablet (20 mg total) by mouth daily with supper. 30 tablet 3   No current facility-administered medications for this encounter.    No Known Allergies  Social History   Socioeconomic History   Marital status: Single    Spouse name: Not on file   Number of children: Not on file   Years of education: Not on file   Highest education level: Not on file  Occupational History   Occupation:  owns [employer]    Employer: [employer]   Occupation: bookkeeper  Tobacco Use   Smoking status: Never   Smokeless tobacco: Never  Substance and Sexual Activity   Alcohol use: Yes    Alcohol/week: 3.0 standard drinks    Types: 3 Cans of beer per week   Drug use: No   Sexual activity: Yes  Other Topics Concern   Not on file  Social History Narrative   Not on file   Social Determinants of Health   Financial Resource Strain: Not on file  Food Insecurity: Not on file  Transportation Needs: Not on file  Physical Activity: Not on file  Stress: Not on file  Social Connections: Not on file  Intimate Partner Violence: Not on file    Family History  Problem Relation Age of Onset   Hypertension Father    Hypertension Brother    Hyperlipidemia Brother    Stroke Mother     ROS- All systems are reviewed and negative except as per the HPI above  Physical Exam: Vitals:   07/02/21 1038  BP: 132/72  Pulse: (!) 59  Weight: 81.3 kg  Height: '5\' 9"'$  (1.753 m)    Wt Readings from Last 3 Encounters:  07/02/21 81.3 kg  05/14/21 80.4 kg  03/15/21 78.9 kg    Labs: Lab Results  Component Value Date   NA 141 03/07/2021   K 4.5 03/07/2021   CL 104 03/07/2021   CO2 22 03/07/2021   GLUCOSE 95 03/07/2021   BUN 17 03/07/2021   CREATININE 0.93 03/07/2021   CALCIUM 9.7 03/07/2021   MG 2.5 10/10/2008   Lab Results  Component Value Date   INR 2.3 08/09/2009   Lab Results  Component Value Date   CHOL 183 03/07/2021   HDL 46 03/07/2021   LDLCALC 112 (H) 03/07/2021   TRIG 140 03/07/2021    GEN- The patient is a well appearing male, alert and oriented x 3 today.   HEENT-head normocephalic, atraumatic, sclera clear, conjunctiva pink, hearing intact, trachea midline. Lungs- Clear to ausculation bilaterally, normal work of breathing Heart- Regular rate and rhythm, no murmurs, rubs or gallops  GI- soft, NT, ND, + BS Extremities- no clubbing, cyanosis, or edema MS- no  significant deformity or atrophy Skin- no rash or lesion Psych- euthymic mood, full affect Neuro- strength and sensation are intact   EKG- SB Vent. rate 59 BPM PR interval 168 ms QRS duration 82 ms QT/QTcB 422/417 ms    Assessment and Plan: 1. Paroxysmal afib  S/p 4th ablation  His heart racing has resolved with diltiazem. Continue diltiazem 120 mg daily with 30 mg PRN for heart racing.  If AAD is needed, could consider Multaq, propafenone, or dofetilide. Recall he did not tolerate flecainide.  Continue Linq monitoring  Continue Xarelto 20 mg daily for one month post chemical conversion.     Follow up with Roderic Palau in one month.  [facility] [address] [place], [place] [address] [phone]

## 2021-07-10 ENCOUNTER — Ambulatory Visit (HOSPITAL_COMMUNITY)
Admission: RE | Admit: 2021-07-10 | Discharge: 2021-07-10 | Disposition: A | Discharge status: Home / Self Care | Payer: Medicare Other | Source: Ambulatory Visit | Attending: Nurse Practitioner | Admitting: Nurse Practitioner

## 2021-07-10 ENCOUNTER — Encounter (HOSPITAL_COMMUNITY): Payer: Self-pay | Admitting: Nurse Practitioner

## 2021-07-10 ENCOUNTER — Other Ambulatory Visit: Payer: Self-pay

## 2021-07-10 VITALS — BP 114/90 | HR 156 | Ht 69.0 in | Wt 181.4 lb

## 2021-07-10 DIAGNOSIS — Z7901 Long term (current) use of anticoagulants: Secondary | ICD-10-CM | POA: Diagnosis not present

## 2021-07-10 DIAGNOSIS — D6869 Other thrombophilia: Secondary | ICD-10-CM

## 2021-07-10 DIAGNOSIS — I4892 Unspecified atrial flutter: Secondary | ICD-10-CM

## 2021-07-10 DIAGNOSIS — Z79899 Other long term (current) drug therapy: Secondary | ICD-10-CM | POA: Insufficient documentation

## 2021-07-10 DIAGNOSIS — I4819 Other persistent atrial fibrillation: Secondary | ICD-10-CM | POA: Insufficient documentation

## 2021-07-10 DIAGNOSIS — I480 Paroxysmal atrial fibrillation: Secondary | ICD-10-CM | POA: Diagnosis present

## 2021-07-10 LAB — COMPREHENSIVE METABOLIC PANEL
ALT: 25 U/L (ref 0–44)
AST: 18 U/L (ref 15–41)
Albumin: 3.7 g/dL (ref 3.5–5.0)
Alkaline Phosphatase: 71 U/L (ref 38–126)
Anion gap: 8 (ref 5–15)
BUN: 19 mg/dL (ref 8–23)
CO2: 22 mmol/L (ref 22–32)
Calcium: 9.2 mg/dL (ref 8.9–10.3)
Chloride: 108 mmol/L (ref 98–111)
Creatinine, Ser: 0.95 mg/dL (ref 0.61–1.24)
GFR, Estimated: >60 mL/min (ref >60)
Glucose, Bld: 83 mg/dL (ref 70–99)
Potassium: 3.9 mmol/L (ref 3.5–5.1)
Sodium: 138 mmol/L (ref 135–145)
Total Bilirubin: 0.9 mg/dL (ref 0.3–1.2)
Total Protein: 6.5 g/dL (ref 6.5–8.1)

## 2021-07-10 LAB — CBC
HCT: 48.5 % (ref 39.0–52.0)
Hemoglobin: 15.9 g/dL (ref 13.0–17.0)
MCH: 29.5 pg (ref 26.0–34.0)
MCHC: 32.8 g/dL (ref 30.0–36.0)
MCV: 90 fL (ref 80.0–100.0)
Platelets: 205 10*3/uL (ref 150–400)
RBC: 5.39 MIL/uL (ref 4.22–5.81)
RDW: 13.3 % (ref 11.5–15.5)
WBC: 8.3 10*3/uL (ref 4.0–10.5)
nRBC: 0 % (ref 0.0–0.2)

## 2021-07-10 LAB — TSH: TSH: 2.302 u[IU]/mL (ref 0.350–4.500)

## 2021-07-10 LAB — MAGNESIUM: Magnesium: 2.3 mg/dL (ref 1.7–2.4)

## 2021-07-10 NOTE — H&P (View-Only) (Signed)
Primary Care Physician: [name], MD Referring Physician: Dr. Wynell Balloon is a 70 y.o. male with a h/o paroxysmal afib that underwent afib ablation x 4, 2 in 2014, one in 2017 and last ablation in 2021.  Patient noted elevated heart rates for the past two weeks. ILR shows an increased afib burden since the end of August. ? If the device is undersensing atrial flutter. He was started on diltiazem and Xarelto on 06/29/21 and his heart racing  resolved with last visit. He then went into persistent atrial flutter with RVR, rate today 156 bpm since 9/22. He feels weak and miserable. He takes 120 mg cadizem daily and has been taking prn cardizem on a regular basis since in rapid flutter, but has touched his rate.  He has only been back on anticoagulation x 6 days so will require TEE/CV so sending hin to ED for urgent cardioversion is not an option.  There were no specific triggers that he could identify. CHA2DS2VASc  score is 2 so he really should stay on anticoagulation long term by guidelinies. BP adequate at 114/90.   NSRToday, he denies symptoms of palpitations, chest pain, shortness of breath, orthopnea, PND, lower extremity edema, dizziness, presyncope, syncope, or neurologic sequela. The patient is tolerating medications without difficulties and is otherwise without complaint today.   Past Medical History:  Diagnosis Date   Atrial fibrillation ([place])    paroxysmal s/p PVI by JA 2010, 2014, 2017   Diverticulosis    Colonoscopy 07/2013   Herpes    History of hepatitis B    Hypercholesterolemia    Varicose veins    Past Surgical History:  Procedure Laterality Date   atrail fibrillation ablation  7/10, 1//17/14   PVI by [place] N/A 10/29/2012   Procedure: ATRIAL FIBRILLATION ABLATION;  Surgeon: Thompson Grayer, MD;  Location: [facility] CATH LAB;  Service: Cardiovascular;  Laterality: N/A;   ATRIAL FIBRILLATION ABLATION N/A 03/24/2020   Procedure:  ATRIAL FIBRILLATION ABLATION;  Surgeon: Thompson Grayer, MD;  Location: [place] CV LAB;  Service: Cardiovascular;  Laterality: N/A;   CARDIOVERSION N/A 09/20/2015   Procedure: CARDIOVERSION;  Surgeon: Thayer Headings, MD;  Location: [place];  Service: Cardiovascular;  Laterality: N/A;   CARDIOVERSION N/A 09/28/2015   Procedure: CARDIOVERSION;  Surgeon: Lelon Perla, MD;  Location: [place];  Service: Cardiovascular;  Laterality: N/A;   COLONOSCOPY  2004   ELECTROPHYSIOLOGIC STUDY N/A 11/28/2015   3rd AF ablation by Dr Rayann Heman   implantable loop recorder placement  10/11/2019   Medtronic Reveal [place] model LNQ22 (SN [id number]) by Dr Rayann Heman for afib management post ablation   SKIN BIOPSY Right 11/03/2018   TEE WITHOUT CARDIOVERSION  10/29/2012   Procedure: TRANSESOPHAGEAL ECHOCARDIOGRAM (TEE);  Surgeon: Fay Records, MD;  Location: [facility] ENDOSCOPY;  Service: Cardiovascular;  Laterality: N/A;  pre ablation   TEE WITHOUT CARDIOVERSION N/A 09/20/2015   Procedure: TRANSESOPHAGEAL ECHOCARDIOGRAM (TEE);  Surgeon: Thayer Headings, MD;  Location: [name] ENDOSCOPY;  Service: Cardiovascular;  Laterality: N/A;   TONSILLECTOMY     when child    Current Outpatient Medications  Medication Sig Dispense Refill   acyclovir (ZOVIRAX) 400 MG tablet TAKE 1 TABLET BY MOUTH THREE TIMES DAILY AS NEEDED AS DIRECTED 30 tablet 0   atorvastatin (LIPITOR) 40 MG tablet TAKE 1 TABLET(40 MG) BY MOUTH DAILY 90 tablet 3   diltiazem (CARDIZEM CD) 120 MG 24 hr capsule Take 1 capsule (  120 mg total) by mouth daily. 30 capsule 3   diltiazem (CARDIZEM) 30 MG tablet TAKE 1 TABLET BY MOUTH E 4 HOURS AS NEEDED FOR HR GREATER THAN 100 45 tablet 3   finasteride (PROSCAR) 5 MG tablet TAKE 1 TABLET(5 MG) BY MOUTH DAILY 90 tablet 3   meclizine (ANTIVERT) 25 MG tablet Take 25 mg by mouth 3 (three) times daily as needed for dizziness. Take as needed for lightheadedness/vertigo     Multiple Vitamin (MULTI VITAMIN PO) Take 1 tablet by  mouth daily.      omeprazole (PRILOSEC) 40 MG capsule Take 40 mg by mouth daily as needed (heartburn or acid reflux).     rivaroxaban (XARELTO) 20 MG TABS tablet Take 1 tablet (20 mg total) by mouth daily with supper. 30 tablet 3   No current facility-administered medications for this encounter.    No Known Allergies  Social History   Socioeconomic History   Marital status: Single    Spouse name: Not on file   Number of children: Not on file   Years of education: Not on file   Highest education level: Not on file  Occupational History   Occupation: owns yoga studio    Employer: [employer]   Occupation: bookkeeper  Tobacco Use   Smoking status: Never   Smokeless tobacco: Never  Substance and Sexual Activity   Alcohol use: Yes    Alcohol/week: 3.0 standard drinks    Types: 3 Cans of beer per week   Drug use: No   Sexual activity: Yes  Other Topics Concern   Not on file  Social History Narrative   Not on file   Social Determinants of Health   Financial Resource Strain: Not on file  Food Insecurity: Not on file  Transportation Needs: Not on file  Physical Activity: Not on file  Stress: Not on file  Social Connections: Not on file  Intimate Partner Violence: Not on file    Family History  Problem Relation Age of Onset   Hypertension Father    Hypertension Brother    Hyperlipidemia Brother    Stroke Mother     ROS- All systems are reviewed and negative except as per the HPI above  Physical Exam: Vitals:   07/10/21 1056  BP: 114/90  Pulse: (!) 156  Weight: 82.3 kg  Height: 5\' 9"  (1.753 m)    Wt Readings from Last 3 Encounters:  07/10/21 82.3 kg  07/02/21 81.3 kg  05/14/21 80.4 kg    Labs: Lab Results  Component Value Date   NA 141 03/07/2021   K 4.5 03/07/2021   CL 104 03/07/2021   CO2 22 03/07/2021   GLUCOSE 95 03/07/2021   BUN 17 03/07/2021   CREATININE 0.93 03/07/2021   CALCIUM 9.7 03/07/2021   MG 2.5 10/10/2008   Lab Results   Component Value Date   INR 2.3 08/09/2009   Lab Results  Component Value Date   CHOL 183 03/07/2021   HDL 46 03/07/2021   LDLCALC 112 (H) 03/07/2021   TRIG 140 03/07/2021    GEN- The patient is a well appearing male, alert and oriented x 3 today.   HEENT-head normocephalic, atraumatic, sclera clear, conjunctiva pink, hearing intact, trachea midline. Lungs- Clear to ausculation bilaterally, normal work of breathing Heart- Rapid irregular rate and rhythm, no murmurs, rubs or gallops  GI- soft, NT, ND, + BS Extremities- no clubbing, cyanosis, or edema MS- no significant deformity or atrophy Skin- no rash or lesion Psych-  euthymic mood, full affect Neuro- strength and sensation are intact   EKG-  atypical atrial flutter at 156 bpm QRS duration 92 ms QT/QTcB 326/525 ms    Assessment and Plan: 1. Paroxysmal afib /persistent atrial flutter since 9/22 Very symptomatic with RVR H/o afib ablations x 4  Extra Cardizem is not touching his rapid rates  He will have to have TEE guided Cardioversion as only on anticoagulation x 6 days  Appreciate endo's efforts to find him a spot tomorrow  for CV to keep him out of hospital, not eligible for CV in ER as he has not been on anticoagulation but for 6 days  Continue diltiazem 120 mg daily with 30 mg PRN for heart racing.  If AAD is needed, could consider Multaq, propafenone, or dofetilide. Recall he did not tolerate flecainide.  On f/u I will discuss placing him on antiarrythmic  as he has had increasing afib burden since end of August  Continue Linq monitoring   Follow up  in afib clinic next week   Butch Penny C. Nicodemus Denk, [facility] [address] [place], [place] [address] [phone]

## 2021-07-10 NOTE — Patient Instructions (Signed)
Cardioversion scheduled for Wednesday, September 28th  - Arrive at the [facility] and go to admitting at 930AM  - Do not eat or drink anything after midnight the night prior to your procedure.  - Take all your morning medication (except diabetic medications) with a sip of water prior to arrival.  - You will not be able to drive home after your procedure.  - Do NOT miss any doses of your blood thinner - if you should miss a dose please notify our office immediately.  - If you feel as if you go back into normal rhythm prior to scheduled cardioversion, please notify our office immediately. If your procedure is canceled in the cardioversion suite you will be charged a cancellation fee.  Patients will be asked to: to mask in public and hand hygiene (no longer quarantine) in the 3 days prior to surgery, to report if any COVID-19-like illness or household contacts to COVID-19 to determine need for testing

## 2021-07-10 NOTE — Progress Notes (Signed)
Primary Care Physician: [name], MD Referring Physician: Dr. Wynell Balloon is a 70 y.o. male with a h/o paroxysmal afib that underwent afib ablation x 4, 2 in 2014, one in 2017 and last ablation in 2021.  Patient noted elevated heart rates for the past two weeks. ILR shows an increased afib burden since the end of August. ? If the device is undersensing atrial flutter. He was started on diltiazem and Xarelto on 06/29/21 and his heart racing  resolved with last visit. He then went into persistent atrial flutter with RVR, rate today 156 bpm since 9/22. He feels weak and miserable. He takes 120 mg cadizem daily and has been taking prn cardizem on a regular basis since in rapid flutter, but has touched his rate.  He has only been back on anticoagulation x 6 days so will require TEE/CV so sending hin to ED for urgent cardioversion is not an option.  There were no specific triggers that he could identify. CHA2DS2VASc  score is 2 so he really should stay on anticoagulation long term by guidelinies. BP adequate at 114/90.   NSRToday, he denies symptoms of palpitations, chest pain, shortness of breath, orthopnea, PND, lower extremity edema, dizziness, presyncope, syncope, or neurologic sequela. The patient is tolerating medications without difficulties and is otherwise without complaint today.   Past Medical History:  Diagnosis Date   Atrial fibrillation ([place])    paroxysmal s/p PVI by JA 2010, 2014, 2017   Diverticulosis    Colonoscopy 07/2013   Herpes    History of hepatitis B    Hypercholesterolemia    Varicose veins    Past Surgical History:  Procedure Laterality Date   atrail fibrillation ablation  7/10, 1//17/14   PVI by [place] N/A 10/29/2012   Procedure: ATRIAL FIBRILLATION ABLATION;  Surgeon: Thompson Grayer, MD;  Location: [facility] CATH LAB;  Service: Cardiovascular;  Laterality: N/A;   ATRIAL FIBRILLATION ABLATION N/A 03/24/2020   Procedure:  ATRIAL FIBRILLATION ABLATION;  Surgeon: Thompson Grayer, MD;  Location: [place] CV LAB;  Service: Cardiovascular;  Laterality: N/A;   CARDIOVERSION N/A 09/20/2015   Procedure: CARDIOVERSION;  Surgeon: Thayer Headings, MD;  Location: [place];  Service: Cardiovascular;  Laterality: N/A;   CARDIOVERSION N/A 09/28/2015   Procedure: CARDIOVERSION;  Surgeon: Lelon Perla, MD;  Location: [place];  Service: Cardiovascular;  Laterality: N/A;   COLONOSCOPY  2004   ELECTROPHYSIOLOGIC STUDY N/A 11/28/2015   3rd AF ablation by Dr Rayann Heman   implantable loop recorder placement  10/11/2019   Medtronic Reveal [place] model LNQ22 (SN RLB [id number]) by Dr Rayann Heman for afib management post ablation   SKIN BIOPSY Right 11/03/2018   TEE WITHOUT CARDIOVERSION  10/29/2012   Procedure: TRANSESOPHAGEAL ECHOCARDIOGRAM (TEE);  Surgeon: Fay Records, MD;  Location: [facility] ENDOSCOPY;  Service: Cardiovascular;  Laterality: N/A;  pre ablation   TEE WITHOUT CARDIOVERSION N/A 09/20/2015   Procedure: TRANSESOPHAGEAL ECHOCARDIOGRAM (TEE);  Surgeon: Thayer Headings, MD;  Location: [facility] ENDOSCOPY;  Service: Cardiovascular;  Laterality: N/A;   TONSILLECTOMY     when child    Current Outpatient Medications  Medication Sig Dispense Refill   acyclovir (ZOVIRAX) 400 MG tablet TAKE 1 TABLET BY MOUTH THREE TIMES DAILY AS NEEDED AS DIRECTED 30 tablet 0   atorvastatin (LIPITOR) 40 MG tablet TAKE 1 TABLET(40 MG) BY MOUTH DAILY 90 tablet 3   diltiazem (CARDIZEM CD) 120 MG 24 hr capsule Take 1 capsule (  120 mg total) by mouth daily. 30 capsule 3   diltiazem (CARDIZEM) 30 MG tablet TAKE 1 TABLET BY MOUTH E 4 HOURS AS NEEDED FOR HR GREATER THAN 100 45 tablet 3   finasteride (PROSCAR) 5 MG tablet TAKE 1 TABLET(5 MG) BY MOUTH DAILY 90 tablet 3   meclizine (ANTIVERT) 25 MG tablet Take 25 mg by mouth 3 (three) times daily as needed for dizziness. Take as needed for lightheadedness/vertigo     Multiple Vitamin (MULTI VITAMIN PO) Take 1 tablet by  mouth daily.      omeprazole (PRILOSEC) 40 MG capsule Take 40 mg by mouth daily as needed (heartburn or acid reflux).     rivaroxaban (XARELTO) 20 MG TABS tablet Take 1 tablet (20 mg total) by mouth daily with supper. 30 tablet 3   No current facility-administered medications for this encounter.    No Known Allergies  Social History   Socioeconomic History   Marital status: Single    Spouse name: Not on file   Number of children: Not on file   Years of education: Not on file   Highest education level: Not on file  Occupational History   Occupation: owns yoga studio    Employer: [employer]   Occupation: bookkeeper  Tobacco Use   Smoking status: Never   Smokeless tobacco: Never  Substance and Sexual Activity   Alcohol use: Yes    Alcohol/week: 3.0 standard drinks    Types: 3 Cans of beer per week   Drug use: No   Sexual activity: Yes  Other Topics Concern   Not on file  Social History Narrative   Not on file   Social Determinants of Health   Financial Resource Strain: Not on file  Food Insecurity: Not on file  Transportation Needs: Not on file  Physical Activity: Not on file  Stress: Not on file  Social Connections: Not on file  Intimate Partner Violence: Not on file    Family History  Problem Relation Age of Onset   Hypertension Father    Hypertension Brother    Hyperlipidemia Brother    Stroke Mother     ROS- All systems are reviewed and negative except as per the HPI above  Physical Exam: Vitals:   07/10/21 1056  BP: 114/90  Pulse: (!) 156  Weight: 82.3 kg  Height: 5\' 9"  (1.753 m)    Wt Readings from Last 3 Encounters:  07/10/21 82.3 kg  07/02/21 81.3 kg  05/14/21 80.4 kg    Labs: Lab Results  Component Value Date   NA 141 03/07/2021   K 4.5 03/07/2021   CL 104 03/07/2021   CO2 22 03/07/2021   GLUCOSE 95 03/07/2021   BUN 17 03/07/2021   CREATININE 0.93 03/07/2021   CALCIUM 9.7 03/07/2021   MG 2.5 10/10/2008   Lab Results   Component Value Date   INR 2.3 08/09/2009   Lab Results  Component Value Date   CHOL 183 03/07/2021   HDL 46 03/07/2021   LDLCALC 112 (H) 03/07/2021   TRIG 140 03/07/2021    GEN- The patient is a well appearing male, alert and oriented x 3 today.   HEENT-head normocephalic, atraumatic, sclera clear, conjunctiva pink, hearing intact, trachea midline. Lungs- Clear to ausculation bilaterally, normal work of breathing Heart- Rapid irregular rate and rhythm, no murmurs, rubs or gallops  GI- soft, NT, ND, + BS Extremities- no clubbing, cyanosis, or edema MS- no significant deformity or atrophy Skin- no rash or lesion Psych-  euthymic mood, full affect Neuro- strength and sensation are intact   EKG-  atypical atrial flutter at 156 bpm QRS duration 92 ms QT/QTcB 326/525 ms    Assessment and Plan: 1. Paroxysmal afib /persistent atrial flutter since 9/22 Very symptomatic with RVR H/o afib ablations x 4  Extra Cardizem is not touching his rapid rates  He will have to have TEE guided Cardioversion as only on anticoagulation x 6 days  Appreciate endo's efforts to find him a spot tomorrow  for CV to keep him out of hospital, not eligible for CV in ER as he has not been on anticoagulation but for 6 days  Continue diltiazem 120 mg daily with 30 mg PRN for heart racing.  If AAD is needed, could consider Multaq, propafenone, or dofetilide. Recall he did not tolerate flecainide.  On f/u I will discuss placing him on antiarrythmic  as he has had increasing afib burden since end of August  Continue Linq monitoring   Follow up  in afib clinic next week   Butch Penny C. Raaga Maeder, [facility] [address] [place], [place] [address] [phone]

## 2021-07-11 ENCOUNTER — Encounter (HOSPITAL_COMMUNITY): Payer: Self-pay | Admitting: Cardiovascular Disease

## 2021-07-11 ENCOUNTER — Encounter (HOSPITAL_COMMUNITY)
Admission: RE | Disposition: A | Discharge status: Home / Self Care | Payer: Self-pay | Source: Home / Self Care | Attending: Cardiovascular Disease

## 2021-07-11 ENCOUNTER — Ambulatory Visit (HOSPITAL_BASED_OUTPATIENT_CLINIC_OR_DEPARTMENT_OTHER)
Admission: RE | Admit: 2021-07-11 | Discharge: 2021-07-11 | Disposition: A | Discharge status: Home / Self Care | Payer: Medicare Other | Source: Home / Self Care | Attending: Nurse Practitioner | Admitting: Nurse Practitioner

## 2021-07-11 ENCOUNTER — Ambulatory Visit (HOSPITAL_COMMUNITY): Payer: Medicare Other | Admitting: Anesthesiology

## 2021-07-11 ENCOUNTER — Other Ambulatory Visit: Payer: Self-pay

## 2021-07-11 ENCOUNTER — Ambulatory Visit (HOSPITAL_COMMUNITY)
Admission: RE | Admit: 2021-07-11 | Discharge: 2021-07-11 | Disposition: A | Discharge status: Home / Self Care | Payer: Medicare Other | Attending: Cardiovascular Disease | Admitting: Cardiovascular Disease

## 2021-07-11 DIAGNOSIS — I10 Essential (primary) hypertension: Secondary | ICD-10-CM | POA: Diagnosis not present

## 2021-07-11 DIAGNOSIS — I4892 Unspecified atrial flutter: Secondary | ICD-10-CM

## 2021-07-11 DIAGNOSIS — I340 Nonrheumatic mitral (valve) insufficiency: Secondary | ICD-10-CM | POA: Diagnosis not present

## 2021-07-11 DIAGNOSIS — I480 Paroxysmal atrial fibrillation: Secondary | ICD-10-CM | POA: Diagnosis not present

## 2021-07-11 DIAGNOSIS — Z79899 Other long term (current) drug therapy: Secondary | ICD-10-CM | POA: Insufficient documentation

## 2021-07-11 DIAGNOSIS — I4819 Other persistent atrial fibrillation: Secondary | ICD-10-CM | POA: Diagnosis not present

## 2021-07-11 DIAGNOSIS — Z7901 Long term (current) use of anticoagulants: Secondary | ICD-10-CM | POA: Insufficient documentation

## 2021-07-11 DIAGNOSIS — I700 Atherosclerosis of aorta: Secondary | ICD-10-CM | POA: Insufficient documentation

## 2021-07-11 DIAGNOSIS — I080 Rheumatic disorders of both mitral and aortic valves: Secondary | ICD-10-CM | POA: Diagnosis not present

## 2021-07-11 DIAGNOSIS — E7800 Pure hypercholesterolemia, unspecified: Secondary | ICD-10-CM | POA: Diagnosis not present

## 2021-07-11 HISTORY — PX: CARDIOVERSION: SHX1299

## 2021-07-11 HISTORY — PX: TEE WITHOUT CARDIOVERSION: SHX5443

## 2021-07-11 SURGERY — ECHOCARDIOGRAM, TRANSESOPHAGEAL
Anesthesia: Monitor Anesthesia Care

## 2021-07-11 MED ORDER — PROPOFOL 500 MG/50ML IV EMUL
INTRAVENOUS | Status: DC | PRN
  Administered 2021-07-11: 100 ug/kg/min via INTRAVENOUS

## 2021-07-11 MED ORDER — PROPOFOL 10 MG/ML IV BOLUS
INTRAVENOUS | Status: DC | PRN
  Administered 2021-07-11: 20 mg via INTRAVENOUS

## 2021-07-11 MED ORDER — SODIUM CHLORIDE 0.9 % IV SOLN: INTRAVENOUS | Status: DC

## 2021-07-11 MED ORDER — LIDOCAINE 2% (20 MG/ML) 5 ML SYRINGE
INTRAMUSCULAR | Status: DC | PRN
  Administered 2021-07-11: 80 mg via INTRAVENOUS

## 2021-07-11 NOTE — CV Procedure (Signed)
   TRANSESOPHAGEAL ECHOCARDIOGRAM GUIDED DIRECT CURRENT CARDIOVERSION  NAME:  Maurice [name]    MRN: [id number] DOB:  30-Jul-1951    ADMIT DATE: 07/11/2021  INDICATIONS: Symptomatic atrial flutter  PROCEDURE:   Informed consent was obtained prior to the procedure. The risks, benefits and alternatives for the procedure were discussed and the patient comprehended these risks.  Risks include, but are not limited to, cough, sore throat, vomiting, nausea, somnolence, esophageal and stomach trauma or perforation, bleeding, low blood pressure, aspiration, pneumonia, infection, trauma to the teeth and death.    After a procedural time-out, the oropharynx was anesthetized and the patient was sedated by the anesthesia service. The transesophageal probe was inserted in the esophagus and stomach without difficulty and multiple views were obtained. Anesthesia was monitored by Dr. Annye Asa.   COMPLICATIONS:    Complications: No complications Patient tolerated procedure well.  KEY FINDINGS:  No LAA thrombus.  Normal LV/RV function.  Full Report to follow.   CARDIOVERSION:     Indications:  Symptomatic Atrial Flutter  Procedure Details:  Once the TEE was complete, the patient had the defibrillator pads placed in the anterior and posterior position. Once an appropriate level of sedation was confirmed, the patient was cardioverted x 1 with 120J of biphasic synchronized energy.  The patient converted to NSR.  There were no apparent complications.  The patient had normal neuro status and respiratory status post procedure with vitals stable as recorded elsewhere.  Adequate airway was maintained throughout and vital signs monitored per protocol.  Lake Bells T. Audie Box, [place]  [address] [place], [place] [address] [phone]  12:05 PM

## 2021-07-11 NOTE — Interval H&P Note (Signed)
History and Physical Interval Note:  07/11/2021 9:42 AM  Maurice [name]  has presented today for surgery, with the diagnosis of afib.  The various methods of treatment have been discussed with the patient and family. After consideration of risks, benefits and other options for treatment, the patient has consented to  Procedure(s): TRANSESOPHAGEAL ECHOCARDIOGRAM (TEE) (N/A) CARDIOVERSION (N/A) as a surgical intervention.  The patient's history has been reviewed, patient examined, no change in status, stable for surgery.  I have reviewed the patient's chart and labs.  Questions were answered to the patient's satisfaction.     NPO for TEE/DCCV for Afib.   Shared Decision Making/Informed Consent The risks [stroke, cardiac arrhythmias rarely resulting in the need for a temporary or permanent pacemaker, skin irritation or burns, esophageal damage, perforation (1:10,000 risk), bleeding, pharyngeal hematoma as well as other potential complications associated with conscious sedation including aspiration, arrhythmia, respiratory failure and death], benefits (treatment guidance, restoration of normal sinus rhythm, diagnostic support) and alternatives of a transesophageal echocardiogram guided cardioversion were discussed in detail with [name] and he is willing to proceed.  [place]ake Bells T. Audie Box, MD, [place]  [address] [place], [place] [address] [phone]  9:42 AM

## 2021-07-11 NOTE — Anesthesia Preprocedure Evaluation (Signed)
Anesthesia Evaluation  Patient identified by MRN, date of birth, ID band Patient awake    Reviewed: Allergy & Precautions, H&P , NPO status , Patient's Chart, lab work & pertinent test results  Airway Mallampati: II   Neck ROM: full    Dental   Pulmonary neg pulmonary ROS,    breath sounds clear to auscultation       Cardiovascular hypertension, + dysrhythmias Atrial Fibrillation  Rhythm:irregular Rate:Normal     Neuro/Psych    GI/Hepatic GERD  ,  Endo/Other    Renal/GU      Musculoskeletal   Abdominal   Peds  Hematology   Anesthesia Other Findings   Reproductive/Obstetrics                             Anesthesia Physical Anesthesia Plan  ASA: 3  Anesthesia Plan: MAC   Post-op Pain Management:    Induction: Intravenous  PONV Risk Score and Plan: 1 and Propofol infusion and Treatment may vary due to age or medical condition  Airway Management Planned: Nasal Cannula  Additional Equipment:   Intra-op Plan:   Post-operative Plan:   Informed Consent: I have reviewed the patients History and Physical, chart, labs and discussed the procedure including the risks, benefits and alternatives for the proposed anesthesia with the patient or authorized representative who has indicated his/her understanding and acceptance.     Dental advisory given  Plan Discussed with: CRNA, Anesthesiologist and Surgeon  Anesthesia Plan Comments:         Anesthesia Quick Evaluation

## 2021-07-11 NOTE — Anesthesia Postprocedure Evaluation (Signed)
Anesthesia Post Note  Patient: Maurice [name]  Procedure(s) Performed: TRANSESOPHAGEAL ECHOCARDIOGRAM (TEE) CARDIOVERSION     Patient location during evaluation: Endoscopy Anesthesia Type: MAC Level of consciousness: awake and alert Pain management: pain level controlled Vital Signs Assessment: post-procedure vital signs reviewed and stable Respiratory status: spontaneous breathing, nonlabored ventilation, respiratory function stable and patient connected to nasal cannula oxygen Cardiovascular status: stable and blood pressure returned to baseline Postop Assessment: no apparent nausea or vomiting Anesthetic complications: no   No notable events documented.  Last Vitals:  Vitals:   07/11/21 1223 07/11/21 1233  BP: 107/69 101/75  Pulse: 64 64  Resp: 18 18  Temp:    SpO2: 94% 97%    Last Pain:  Vitals:   07/11/21 1233  TempSrc:   PainSc: 0-No pain                 Mekiah Cambridge S

## 2021-07-11 NOTE — Anesthesia Procedure Notes (Signed)
Procedure Name: General with mask airway Date/Time: 07/11/2021 11:46 AM Performed by: Dorthea Cove, CRNA Pre-anesthesia Checklist: Patient identified, Emergency Drugs available, Suction available, Patient being monitored and Timeout performed Patient Re-evaluated:Patient Re-evaluated prior to induction Oxygen Delivery Method: Nasal cannula Preoxygenation: Pre-oxygenation with 100% oxygen Induction Type: IV induction Placement Confirmation: positive ETCO2 and CO2 detector Dental Injury: Teeth and Oropharynx as per pre-operative assessment

## 2021-07-11 NOTE — Transfer of Care (Signed)
Immediate Anesthesia Transfer of Care Note  Patient: Maurice [name]  Procedure(s) Performed: TRANSESOPHAGEAL ECHOCARDIOGRAM (TEE) CARDIOVERSION  Patient Location: Endoscopy Unit  Anesthesia Type:General  Level of Consciousness: awake, alert  and oriented  Airway & Oxygen Therapy: Patient Spontanous Breathing  Post-op Assessment: Report given to RN and Post -op Vital signs reviewed and stable  Post vital signs: Reviewed and stable  Last Vitals:  Vitals Value Taken Time  BP 87/65 07/11/21 1215  Temp 36.1 C 07/11/21 1214  Pulse 62 07/11/21 1218  Resp 14 07/11/21 1218  SpO2 91 % 07/11/21 1218  Vitals shown include unvalidated device data.  Last Pain:  Vitals:   07/11/21 1214  TempSrc: Temporal  PainSc: 0-No pain         Complications: No notable events documented.

## 2021-07-13 ENCOUNTER — Encounter (HOSPITAL_COMMUNITY): Payer: Self-pay | Admitting: Cardiovascular Disease

## 2021-07-16 ENCOUNTER — Telehealth: Payer: Self-pay

## 2021-07-16 NOTE — Telephone Encounter (Signed)
[facility] alert-Pt. back in AF 10/3 @ 02:06 ongoing Events show HR's 95-176, counters have still not reset after DCCV 9/28 Xarelto, Diltiazem, and prn Diltiazem prescribed  Spoke with pt, he was not aware of being out of rhythm.  He reports he has f/u appt at AF clinic tomorrow.    Pt indicates he has been compliant with meds as ordered and will take a PRN Diltiazem now.

## 2021-07-17 ENCOUNTER — Other Ambulatory Visit: Payer: Self-pay

## 2021-07-17 ENCOUNTER — Ambulatory Visit (HOSPITAL_COMMUNITY)
Admission: RE | Admit: 2021-07-17 | Discharge: 2021-07-17 | Disposition: A | Discharge status: Home / Self Care | Payer: Medicare Other | Source: Ambulatory Visit | Attending: Nurse Practitioner | Admitting: Nurse Practitioner

## 2021-07-17 ENCOUNTER — Encounter (HOSPITAL_COMMUNITY): Payer: Self-pay | Admitting: Nurse Practitioner

## 2021-07-17 ENCOUNTER — Ambulatory Visit (HOSPITAL_COMMUNITY): Payer: Medicare Other | Admitting: Nurse Practitioner

## 2021-07-17 VITALS — BP 116/86 | HR 150 | Ht 69.0 in | Wt 180.6 lb

## 2021-07-17 DIAGNOSIS — Z79899 Other long term (current) drug therapy: Secondary | ICD-10-CM | POA: Insufficient documentation

## 2021-07-17 DIAGNOSIS — I480 Paroxysmal atrial fibrillation: Secondary | ICD-10-CM | POA: Diagnosis not present

## 2021-07-17 DIAGNOSIS — D6869 Other thrombophilia: Secondary | ICD-10-CM

## 2021-07-17 DIAGNOSIS — I4892 Unspecified atrial flutter: Secondary | ICD-10-CM | POA: Diagnosis not present

## 2021-07-17 DIAGNOSIS — Z8249 Family history of ischemic heart disease and other diseases of the circulatory system: Secondary | ICD-10-CM | POA: Insufficient documentation

## 2021-07-17 DIAGNOSIS — Z7901 Long term (current) use of anticoagulants: Secondary | ICD-10-CM | POA: Insufficient documentation

## 2021-07-17 MED ORDER — PROPAFENONE HCL ER 225 MG PO CP12: 225.0000 mg | ORAL_CAPSULE | Freq: Two times a day (BID) | ORAL | 1 refills | Status: DC

## 2021-07-17 MED ORDER — PROPAFENONE HCL 300 MG PO TABS: 600.0000 mg | ORAL_TABLET | Freq: Once | ORAL | 0 refills | Status: DC

## 2021-07-17 NOTE — Progress Notes (Signed)
Primary Care Physician: [name], MD Referring Physician: Dr. Wynell Balloon is a 70 y.o. male with a h/o paroxysmal afib that underwent afib ablation x 4, 2 in 2014, one in 2017 and last ablation in 2021.  Patient noted elevated heart rates for the past two weeks. ILR shows an increased afib burden since the end of August. ? If the device is undersensing atrial flutter. He was started on diltiazem and Xarelto on 06/29/21 and his heart racing  resolved with last visit. He then went into persistent atrial flutter with RVR, rate today 156 bpm since 9/22. He feels weak and miserable. He takes 120 mg cadizem daily and has been taking prn cardizem on a regular basis since in rapid flutter, but has touched his rate.  He has only been back on anticoagulation x 6 days so will require TEE/CV so sending hin to ED for urgent cardioversion is not an option.  There were no specific triggers that he could identify. CHA2DS2VASc  score is 2 so he really should stay on anticoagulation long term by guidelinies. BP adequate at 114/90.   Pt returns  today, 07/17/21 for return of atrial flutter at 150 bpm. Cardioversion lasted 4 days. Right now he seems to be tolerating. I am afraid to increase daily Cardizem as it it dropped BP last week when out of rhythm. I discussed with Dr. Rayann Heman how to approach. He suggested 600 mg propafenone today and then 225 mg bid and return to clinic for ekg on Thursday. If this fails then possibly tikosyn,  but Dr. Rayann Heman  is not in favor of another ablation. Pt is on young  side for amiodarone.   NSRToday, he denies symptoms of palpitations, chest pain, shortness of breath, orthopnea, PND, lower extremity edema, dizziness, presyncope, syncope, or neurologic sequela. The patient is tolerating medications without difficulties and is otherwise without complaint today.   Past Medical History:  Diagnosis Date   Atrial fibrillation ([place])    paroxysmal s/p PVI by JA 2010,  2014, 2017   Diverticulosis    Colonoscopy 07/2013   Herpes    History of hepatitis B    Hypercholesterolemia    Varicose veins    Past Surgical History:  Procedure Laterality Date   atrail fibrillation ablation  7/10, 1//17/14   PVI by [place] N/A 10/29/2012   Procedure: ATRIAL FIBRILLATION ABLATION;  Surgeon: Thompson Grayer, MD;  Location: [facility] CATH LAB;  Service: Cardiovascular;  Laterality: N/A;   ATRIAL FIBRILLATION ABLATION N/A 03/24/2020   Procedure: ATRIAL FIBRILLATION ABLATION;  Surgeon: Thompson Grayer, MD;  Location: [place] CV LAB;  Service: Cardiovascular;  Laterality: N/A;   CARDIOVERSION N/A 09/20/2015   Procedure: CARDIOVERSION;  Surgeon: Thayer Headings, MD;  Location: [place];  Service: Cardiovascular;  Laterality: N/A;   CARDIOVERSION N/A 09/28/2015   Procedure: CARDIOVERSION;  Surgeon: Lelon Perla, MD;  Location: [facility] ENDOSCOPY;  Service: Cardiovascular;  Laterality: N/A;   CARDIOVERSION N/A 07/11/2021   Procedure: CARDIOVERSION;  Surgeon: Geralynn Rile, MD;  Location: [place];  Service: Cardiovascular;  Laterality: N/A;   COLONOSCOPY  2004   ELECTROPHYSIOLOGIC STUDY N/A 11/28/2015   3rd AF ablation by Dr Rayann Heman   implantable loop recorder placement  10/11/2019   Medtronic Reveal [place] model LNQ22 (SN [id number]) by Dr Rayann Heman for afib management post ablation   SKIN BIOPSY Right 11/03/2018   TEE WITHOUT CARDIOVERSION  10/29/2012   Procedure: TRANSESOPHAGEAL  ECHOCARDIOGRAM (TEE);  Surgeon: Fay Records, MD;  Location[facility] ENDOSCOPY;  Service: Cardiovascular;  Laterality: N/A;  pre ablation   TEE WITHOUT CARDIOVERSION N/A 09/20/2015   Procedure: TRANSESOPHAGEAL ECHOCARDIOGRAM (TEE);  Surgeon: Thayer Headings, MD;  Location: [place];  Service: Cardiovascular;  Laterality: N/A;   TEE WITHOUT CARDIOVERSION N/A 07/11/2021   Procedure: TRANSESOPHAGEAL ECHOCARDIOGRAM (TEE);  Surgeon: Geralynn Rile, MD;  Location: [place];  Service: Cardiovascular;  Laterality: N/A;   TONSILLECTOMY     when child    Current Outpatient Medications  Medication Sig Dispense Refill   acyclovir (ZOVIRAX) 400 MG tablet TAKE 1 TABLET BY MOUTH THREE TIMES DAILY AS NEEDED AS DIRECTED (Patient taking differently: Take 400 mg by mouth daily as needed (fever blister).) 30 tablet 0   atorvastatin (LIPITOR) 40 MG tablet TAKE 1 TABLET(40 MG) BY MOUTH DAILY 90 tablet 3   diltiazem (CARDIZEM CD) 120 MG 24 hr capsule Take 1 capsule (120 mg total) by mouth daily. 30 capsule 3   diltiazem (CARDIZEM) 30 MG tablet TAKE 1 TABLET BY MOUTH E 4 HOURS AS NEEDED FOR HR GREATER THAN 100 45 tablet 3   finasteride (PROSCAR) 5 MG tablet TAKE 1 TABLET(5 MG) BY MOUTH DAILY 90 tablet 3   Multiple Vitamin (MULTI VITAMIN PO) Take 1 tablet by mouth daily.      omeprazole (PRILOSEC) 40 MG capsule Take 40 mg by mouth daily as needed (heartburn or acid reflux).     propafenone (RYTHMOL SR) 225 MG 12 hr capsule Take 1 capsule (225 mg total) by mouth 2 (two) times daily. Start 07/18/21 60 capsule 1   propafenone (RYTHMOL) 300 MG tablet Take 2 tablets (600 mg total) by mouth once for 1 dose. 2 tablet 0   rivaroxaban (XARELTO) 20 MG TABS tablet Take 1 tablet (20 mg total) by mouth daily with supper. 30 tablet 3   No current facility-administered medications for this encounter.    No Known Allergies  Social History   Socioeconomic History   Marital status: Single    Spouse name: Not on file   Number of children: Not on file   Years of education: Not on file   Highest education level: Not on file  Occupational History   Occupation: owns yoga studio    Employer: [employer]   Occupation: bookkeeper  Tobacco Use   Smoking status: Never   Smokeless tobacco: Never  Substance and Sexual Activity   Alcohol use: Yes    Alcohol/week: 3.0 standard drinks    Types: 3 Cans of beer per week   Drug use: No   Sexual activity: Yes  Other Topics Concern    Not on file  Social History Narrative   Not on file   Social Determinants of Health   Financial Resource Strain: Not on file  Food Insecurity: Not on file  Transportation Needs: Not on file  Physical Activity: Not on file  Stress: Not on file  Social Connections: Not on file  Intimate Partner Violence: Not on file    Family History  Problem Relation Age of Onset   Hypertension Father    Hypertension Brother    Hyperlipidemia Brother    Stroke Mother     ROS- All systems are reviewed and negative except as per the HPI above  Physical Exam: Vitals:   07/17/21 1353  BP: 116/86  Pulse: (!) 150  Weight: 81.9 kg  Height: 5\' 9"  (1.753 m)    Wt Readings from Last  3 Encounters:  07/17/21 81.9 kg  07/11/21 81.2 kg  07/10/21 82.3 kg    Labs: Lab Results  Component Value Date   NA 138 07/10/2021   K 3.9 07/10/2021   CL 108 07/10/2021   CO2 22 07/10/2021   GLUCOSE 83 07/10/2021   BUN 19 07/10/2021   CREATININE 0.95 07/10/2021   CALCIUM 9.2 07/10/2021   MG 2.3 07/10/2021   Lab Results  Component Value Date   INR 2.3 08/09/2009   Lab Results  Component Value Date   CHOL 183 03/07/2021   HDL 46 03/07/2021   LDLCALC 112 (H) 03/07/2021   TRIG 140 03/07/2021    GEN- The patient is a well appearing male, alert and oriented x 3 today.   HEENT-head normocephalic, atraumatic, sclera clear, conjunctiva pink, hearing intact, trachea midline. Lungs- Clear to ausculation bilaterally, normal work of breathing Heart- Rapid irregular rate and rhythm, no murmurs, rubs or gallops  GI- soft, NT, ND, + BS Extremities- no clubbing, cyanosis, or edema MS- no significant deformity or atrophy Skin- no rash or lesion Psych- euthymic mood, full affect Neuro- strength and sensation are intact   EKG-  atypical atrial flutter at 150 bpm QRS duration 94 ms QT/QTc 508 ms     Assessment and Plan: 1. H/o Paroxysmal afib /Persistent atrial flutter since last cardioversion 6  days ago   CV was successful x 4 days  H/o afib ablations x 4  Extra Cardizem is not touching his rapid rates  He had aTEE guided Cardioversion as only on anticoagulation x 6 days prior to cardioversion 9/28  Discussed with Dr. Rayann Heman and he suggested Rythmol 600 mg today and then 225 mg bid  Return to the office on Thursday for EKG  Continue diltiazem 120 mg daily with 30 mg PRN for heart racing.  He  did not tolerate flecainide in the past vrs it did not work to convert to [place], pt is unsure  Continue Linq monitoring   Follow up  in Thursday   Canna Nickelson C. Rossie Scarfone, [facility] [address] [place], [place] [address] [phone]

## 2021-07-19 ENCOUNTER — Ambulatory Visit (HOSPITAL_COMMUNITY)
Admission: RE | Admit: 2021-07-19 | Discharge: 2021-07-19 | Disposition: A | Discharge status: Home / Self Care | Payer: Medicare Other | Source: Ambulatory Visit | Attending: Nurse Practitioner | Admitting: Nurse Practitioner

## 2021-07-19 ENCOUNTER — Other Ambulatory Visit: Payer: Self-pay

## 2021-07-19 ENCOUNTER — Encounter (HOSPITAL_COMMUNITY): Payer: Self-pay | Admitting: Nurse Practitioner

## 2021-07-19 VITALS — BP 120/70 | HR 63 | Ht 69.0 in | Wt 181.8 lb

## 2021-07-19 DIAGNOSIS — I480 Paroxysmal atrial fibrillation: Secondary | ICD-10-CM | POA: Diagnosis not present

## 2021-07-19 DIAGNOSIS — D6869 Other thrombophilia: Secondary | ICD-10-CM

## 2021-07-19 DIAGNOSIS — Z8249 Family history of ischemic heart disease and other diseases of the circulatory system: Secondary | ICD-10-CM | POA: Insufficient documentation

## 2021-07-19 DIAGNOSIS — I4892 Unspecified atrial flutter: Secondary | ICD-10-CM | POA: Diagnosis not present

## 2021-07-19 DIAGNOSIS — Z79899 Other long term (current) drug therapy: Secondary | ICD-10-CM | POA: Insufficient documentation

## 2021-07-19 DIAGNOSIS — Z7901 Long term (current) use of anticoagulants: Secondary | ICD-10-CM | POA: Insufficient documentation

## 2021-07-19 NOTE — Progress Notes (Signed)
Primary Care Physician: [name], MD Referring Physician: Dr. Wynell Balloon is a 70 y.o. male with a h/o paroxysmal afib that underwent afib ablation x 4, 2 in 2014, one in 2017 and last ablation in 2021.  Patient noted elevated heart rates for the past two weeks. ILR shows an increased afib burden since the end of August. ? If the device is undersensing atrial flutter. He was started on diltiazem and Xarelto on 06/29/21 and his heart racing  resolved with last visit. He then went into persistent atrial flutter with RVR, rate today 156 bpm since 9/22. He feels weak and miserable. He takes 120 mg cadizem daily and has been taking prn cardizem on a regular basis since in rapid flutter, but has touched his rate.  He has only been back on anticoagulation x 6 days so will require TEE/CV so sending hin to ED for urgent cardioversion is not an option.  There were no specific triggers that he could identify. CHA2DS2VASc  score is 2 so he really should stay on anticoagulation long term by guidelinies. BP adequate at 114/90.   Pt returns  today, 07/17/21 for return of atrial flutter at 150 bpm. Cardioversion lasted 4 days. Right now he seems to be tolerating. I am afraid to increase daily Cardizem as it it dropped BP last week when out of rhythm. I discussed with Dr. Rayann Heman how to approach. He suggested 600 mg propafenone today and then 225 mg bid and return to clinic for ekg on Thursday. If this fails then possibly tikosyn,  but Dr. Rayann Heman  is not in favor of another ablation. Pt is on young  side for amiodarone.   F/u in afib clinic for start of Rythmol, 07/19/21.The original plan was to take 300 mg of Rythmol and then start 225 mg ER bid the next day. The pharmacy did not have either dose but could get the 225 mg of Rythmol before he could get the 600 mg dose, would not be for several days. So he just started the 225 mg dose and went back into SR this am, after dose last pm and this am  dose. He is feeling improved, but a little lightheaded  this pm.   NSRToday, he denies symptoms of palpitations, chest pain, shortness of breath, orthopnea, PND, lower extremity edema, dizziness, presyncope, syncope, or neurologic sequela. The patient is tolerating medications without difficulties and is otherwise without complaint today.   Past Medical History:  Diagnosis Date   Atrial fibrillation ([place])    paroxysmal s/p PVI by JA 2010, 2014, 2017   Diverticulosis    Colonoscopy 07/2013   Herpes    History of hepatitis B    Hypercholesterolemia    Varicose veins    Past Surgical History:  Procedure Laterality Date   atrail fibrillation ablation  7/10, 1//17/14   PVI by [place] N/A 10/29/2012   Procedure: ATRIAL FIBRILLATION ABLATION;  Surgeon: Thompson Grayer, MD;  Location: [facility] CATH LAB;  Service: Cardiovascular;  Laterality: N/A;   ATRIAL FIBRILLATION ABLATION N/A 03/24/2020   Procedure: ATRIAL FIBRILLATION ABLATION;  Surgeon: Thompson Grayer, MD;  Location: [place] CV LAB;  Service: Cardiovascular;  Laterality: N/A;   CARDIOVERSION N/A 09/20/2015   Procedure: CARDIOVERSION;  Surgeon: Thayer Headings, MD;  Location: [place];  Service: Cardiovascular;  Laterality: N/A;   CARDIOVERSION N/A 09/28/2015   Procedure: CARDIOVERSION;  Surgeon: Lelon Perla, MD;  Location: [place];  Service: Cardiovascular;  Laterality: N/A;   CARDIOVERSION N/A 07/11/2021   Procedure: CARDIOVERSION;  Surgeon: Geralynn Rile, MD;  Location: [place];  Service: Cardiovascular;  Laterality: N/A;   COLONOSCOPY  2004   ELECTROPHYSIOLOGIC STUDY N/A 11/28/2015   3rd AF ablation by Dr Rayann Heman   implantable loop recorder placement  10/11/2019   Medtronic Reveal [place] model LNQ22 (SN [id number]) by Dr Rayann Heman for afib management post ablation   SKIN BIOPSY Right 11/03/2018   TEE WITHOUT CARDIOVERSION  10/29/2012   Procedure: TRANSESOPHAGEAL ECHOCARDIOGRAM (TEE);  Surgeon:  Fay Records, MD;  Location: [facility] ENDOSCOPY;  Service: Cardiovascular;  Laterality: N/A;  pre ablation   TEE WITHOUT CARDIOVERSION N/A 09/20/2015   Procedure: TRANSESOPHAGEAL ECHOCARDIOGRAM (TEE);  Surgeon: Thayer Headings, MD;  Location: [place];  Service: Cardiovascular;  Laterality: N/A;   TEE WITHOUT CARDIOVERSION N/A 07/11/2021   Procedure: TRANSESOPHAGEAL ECHOCARDIOGRAM (TEE);  Surgeon: Geralynn Rile, MD;  Location: [place];  Service: Cardiovascular;  Laterality: N/A;   TONSILLECTOMY     when child    Current Outpatient Medications  Medication Sig Dispense Refill   acyclovir (ZOVIRAX) 400 MG tablet TAKE 1 TABLET BY MOUTH THREE TIMES DAILY AS NEEDED AS DIRECTED (Patient taking differently: Take 400 mg by mouth daily as needed (fever blister).) 30 tablet 0   atorvastatin (LIPITOR) 40 MG tablet TAKE 1 TABLET(40 MG) BY MOUTH DAILY 90 tablet 3   diltiazem (CARDIZEM CD) 120 MG 24 hr capsule Take 1 capsule (120 mg total) by mouth daily. 30 capsule 3   diltiazem (CARDIZEM) 30 MG tablet TAKE 1 TABLET BY MOUTH E 4 HOURS AS NEEDED FOR HR GREATER THAN 100 45 tablet 3   finasteride (PROSCAR) 5 MG tablet TAKE 1 TABLET(5 MG) BY MOUTH DAILY 90 tablet 3   Multiple Vitamin (MULTI VITAMIN PO) Take 1 tablet by mouth daily.      omeprazole (PRILOSEC) 40 MG capsule Take 40 mg by mouth daily as needed (heartburn or acid reflux).     propafenone (RYTHMOL SR) 225 MG 12 hr capsule Take 1 capsule (225 mg total) by mouth 2 (two) times daily. Start 07/18/21 60 capsule 1   propafenone (RYTHMOL) 300 MG tablet Take 2 tablets (600 mg total) by mouth once for 1 dose. 2 tablet 0   rivaroxaban (XARELTO) 20 MG TABS tablet Take 1 tablet (20 mg total) by mouth daily with supper. 30 tablet 3   No current facility-administered medications for this encounter.    No Known Allergies  Social History   Socioeconomic History   Marital status: Single    Spouse name: Not on file   Number of children: Not on file    Years of education: Not on file   Highest education level: Not on file  Occupational History   Occupation: owns yoga studio    Employer: [employer]   Occupation: bookkeeper  Tobacco Use   Smoking status: Never   Smokeless tobacco: Never  Substance and Sexual Activity   Alcohol use: Yes    Alcohol/week: 3.0 standard drinks    Types: 3 Cans of beer per week   Drug use: No   Sexual activity: Yes  Other Topics Concern   Not on file  Social History Narrative   Not on file   Social Determinants of Health   Financial Resource Strain: Not on file  Food Insecurity: Not on file  Transportation Needs: Not on file  Physical Activity: Not on file  Stress: Not on file  Social Connections: Not on file  Intimate Partner Violence: Not on file    Family History  Problem Relation Age of Onset   Hypertension Father    Hypertension Brother    Hyperlipidemia Brother    Stroke Mother     ROS- All systems are reviewed and negative except as per the HPI above  Physical Exam: Vitals:   07/19/21 1358  Height: 5\' 9"  (1.753 m)    Wt Readings from Last 3 Encounters:  07/17/21 81.9 kg  07/11/21 81.2 kg  07/10/21 82.3 kg    Labs: Lab Results  Component Value Date   NA 138 07/10/2021   K 3.9 07/10/2021   CL 108 07/10/2021   CO2 22 07/10/2021   GLUCOSE 83 07/10/2021   BUN 19 07/10/2021   CREATININE 0.95 07/10/2021   CALCIUM 9.2 07/10/2021   MG 2.3 07/10/2021   Lab Results  Component Value Date   INR 2.3 08/09/2009   Lab Results  Component Value Date   CHOL 183 03/07/2021   HDL 46 03/07/2021   LDLCALC 112 (H) 03/07/2021   TRIG 140 03/07/2021    GEN- The patient is a well appearing male, alert and oriented x 3 today.   HEENT-head normocephalic, atraumatic, sclera clear, conjunctiva pink, hearing intact, trachea midline. Lungs- Clear to ausculation bilaterally, normal work of breathing Heart-  regular rate and rhythm, no murmurs, rubs or gallops  GI- soft, NT,  ND, + BS Extremities- no clubbing, cyanosis, or edema MS- no significant deformity or atrophy Skin- no rash or lesion Psych- euthymic mood, full affect Neuro- strength and sensation are intact   EKG-  NSR at 63 bpm, pr int 178 ms, qrs int 82 ms, qtc 421 ms  QRS duration 94 ms QT/QTc 508 ms    Assessment and Plan: 1. H/o Paroxysmal afib /Persistent atrial flutter since last cardioversion 6 days ago   CV was successful x 4 days  H/o afib ablations x 4  Extra Cardizem is not touching his rapid rates  He had aTEE guided Cardioversion as only on anticoagulation x 6 days prior to cardioversion 9/28  Discussed with Dr. Rayann Heman and he suggested Rythmol 600 mg today and then 225 mg bid  Pt could not get the short acting 600 mg as pharmacy would have to order and it would take several days, could not locate it in other pharmacies as well Therefore he started the 225 mg dose last night and this am, returned to SR an hour after second dose  Continue diltiazem 120 mg daily   He  did not tolerate flecainide in the past vrs it did not work to convert to [place],  unsure  Continue Linq monitoring   Follow up in one week for EKG   Agripina Guyette C. Annelisa Ryback, [facility] [address] [place], [place] [address] [phone]

## 2021-07-25 ENCOUNTER — Telehealth: Payer: Self-pay | Admitting: Pharmacist

## 2021-07-25 ENCOUNTER — Encounter (HOSPITAL_COMMUNITY): Payer: Self-pay | Admitting: Nurse Practitioner

## 2021-07-25 ENCOUNTER — Ambulatory Visit (HOSPITAL_COMMUNITY)
Admission: RE | Admit: 2021-07-25 | Discharge: 2021-07-25 | Disposition: A | Discharge status: Home / Self Care | Payer: Medicare Other | Source: Ambulatory Visit | Attending: Nurse Practitioner | Admitting: Nurse Practitioner

## 2021-07-25 ENCOUNTER — Other Ambulatory Visit: Payer: Self-pay

## 2021-07-25 VITALS — BP 136/110 | HR 122 | Ht 69.0 in | Wt 180.4 lb

## 2021-07-25 DIAGNOSIS — Z79899 Other long term (current) drug therapy: Secondary | ICD-10-CM | POA: Diagnosis not present

## 2021-07-25 DIAGNOSIS — I4892 Unspecified atrial flutter: Secondary | ICD-10-CM | POA: Diagnosis not present

## 2021-07-25 DIAGNOSIS — D6869 Other thrombophilia: Secondary | ICD-10-CM

## 2021-07-25 DIAGNOSIS — I480 Paroxysmal atrial fibrillation: Secondary | ICD-10-CM | POA: Diagnosis not present

## 2021-07-25 NOTE — Patient Instructions (Signed)
Stop propafenone after tomorrow

## 2021-07-25 NOTE — Progress Notes (Signed)
Primary Care Physician: [name], MD Referring Physician: Dr. Wynell Balloon is a 70 y.o. male with a h/o paroxysmal afib that underwent afib ablation x 4, 2 in 2014, one in 2017 and last ablation in 2021.  Patient noted elevated heart rates for the past two weeks. ILR shows an increased afib burden since the end of August. ? If the device is undersensing atrial flutter. He was started on diltiazem and Xarelto on 06/29/21 and his heart racing  resolved with last visit. He then went into persistent atrial flutter with RVR, rate today 156 bpm since 9/22. He feels weak and miserable. He takes 120 mg cadizem daily and has been taking prn cardizem on a regular basis since in rapid flutter, but has touched his rate.  He has only been back on anticoagulation x 6 days so will require TEE/CV so sending hin to ED for urgent cardioversion is not an option.  There were no specific triggers that he could identify. CHA2DS2VASc  score is 2 so he really should stay on anticoagulation long term by guidelinies. BP adequate at 114/90.   Pt returns  today, 07/17/21 for return of atrial flutter at 150 bpm. Cardioversion lasted 4 days. Right now he seems to be tolerating. I am afraid to increase daily Cardizem as it it dropped BP last week when out of rhythm. I discussed with Dr. Rayann Heman how to approach. He suggested 600 mg propafenone today and then 225 mg bid and return to clinic for ekg on Thursday. If this fails then possibly tikosyn,  but Dr. Rayann Heman  is not in favor of another ablation. Pt is on young  side for amiodarone.   F/u in afib clinic for start of Rythmol, 07/19/21.The original plan was to take 300 mg of Rythmol and then start 225 mg ER bid the next day. The pharmacy did not have either dose but could get the 225 mg of Rythmol before he could get the 600 mg dose, would not be for several days. So he just started the 225 mg dose and went back into SR this am, after dose last pm and this am  dose. He is feeling improved, but a little lightheaded  this pm.   Pt being seen in the afib clinic, 10/13,  as he reported going back out of rhythm on Monday, confirmed with Linq and EKG shows rapid aflutter. He is on extended release propafenone 225 mg bid . I discussed with Dr. Rayann Heman who suggested to try to increase to 325 mg bid and bring in on Friday for EKG. Unfortunately, the 2 drugstores where the drug was least expensive, would take 2-3 days to get the drug in. Alsp, he feels very lightheaded on drug, even in SR and I was afraid that he would not tolerate the higher dose. Therefore, will washout drug x 3 days, none after tomorrow and will plan for tikosyn admit next Monday. No benadryl use, no missed anticoagulation and qt in SR was 421 ms.   NSRToday, he denies symptoms of palpitations, chest pain, shortness of breath, orthopnea, PND, lower extremity edema, dizziness, presyncope, syncope, or neurologic sequela. The patient is tolerating medications without difficulties and is otherwise without complaint today.   Past Medical History:  Diagnosis Date   Atrial fibrillation ([place])    paroxysmal s/p PVI by JA 2010, 2014, 2017   Diverticulosis    Colonoscopy 07/2013   Herpes    History of hepatitis B  Hypercholesterolemia    Varicose veins    Past Surgical History:  Procedure Laterality Date   atrail fibrillation ablation  7/10, 1//17/14   PVI by [place] N/A 10/29/2012   Procedure: ATRIAL FIBRILLATION ABLATION;  Surgeon: Thompson Grayer, MD;  Location: [facility] CATH LAB;  Service: Cardiovascular;  Laterality: N/A;   ATRIAL FIBRILLATION ABLATION N/A 03/24/2020   Procedure: ATRIAL FIBRILLATION ABLATION;  Surgeon: Thompson Grayer, MD;  Location: [place] CV LAB;  Service: Cardiovascular;  Laterality: N/A;   CARDIOVERSION N/A 09/20/2015   Procedure: CARDIOVERSION;  Surgeon: Thayer Headings, MD;  Location: [place];  Service: Cardiovascular;  Laterality: N/A;    CARDIOVERSION N/A 09/28/2015   Procedure: CARDIOVERSION;  Surgeon: Lelon Perla, MD;  Location: [facility] ENDOSCOPY;  Service: Cardiovascular;  Laterality: N/A;   CARDIOVERSION N/A 07/11/2021   Procedure: CARDIOVERSION;  Surgeon: Geralynn Rile, MD;  Location: [place];  Service: Cardiovascular;  Laterality: N/A;   COLONOSCOPY  2004   ELECTROPHYSIOLOGIC STUDY N/A 11/28/2015   3rd AF ablation by Dr Rayann Heman   implantable loop recorder placement  10/11/2019   Medtronic Reveal [place] model LNQ22 (SN [id number]) by Dr Rayann Heman for afib management post ablation   SKIN BIOPSY Right 11/03/2018   TEE WITHOUT CARDIOVERSION  10/29/2012   Procedure: TRANSESOPHAGEAL ECHOCARDIOGRAM (TEE);  Surgeon: Fay Records, MD;  Location: [facility] ENDOSCOPY;  Service: Cardiovascular;  Laterality: N/A;  pre ablation   TEE WITHOUT CARDIOVERSION N/A 09/20/2015   Procedure: TRANSESOPHAGEAL ECHOCARDIOGRAM (TEE);  Surgeon: Thayer Headings, MD;  Location: [place];  Service: Cardiovascular;  Laterality: N/A;   TEE WITHOUT CARDIOVERSION N/A 07/11/2021   Procedure: TRANSESOPHAGEAL ECHOCARDIOGRAM (TEE);  Surgeon: Geralynn Rile, MD;  Location: [place];  Service: Cardiovascular;  Laterality: N/A;   TONSILLECTOMY     when child    Current Outpatient Medications  Medication Sig Dispense Refill   acyclovir (ZOVIRAX) 400 MG tablet TAKE 1 TABLET BY MOUTH THREE TIMES DAILY AS NEEDED AS DIRECTED (Patient taking differently: Take 400 mg by mouth daily as needed (fever blister).) 30 tablet 0   atorvastatin (LIPITOR) 40 MG tablet TAKE 1 TABLET(40 MG) BY MOUTH DAILY 90 tablet 3   diltiazem (CARDIZEM CD) 120 MG 24 hr capsule Take 1 capsule (120 mg total) by mouth daily. 30 capsule 3   diltiazem (CARDIZEM) 30 MG tablet TAKE 1 TABLET BY MOUTH E 4 HOURS AS NEEDED FOR HR GREATER THAN 100 45 tablet 3   finasteride (PROSCAR) 5 MG tablet TAKE 1 TABLET(5 MG) BY MOUTH DAILY 90 tablet 3   Multiple Vitamin (MULTI VITAMIN PO) Take 1 tablet  by mouth daily.      omeprazole (PRILOSEC) 40 MG capsule Take 40 mg by mouth daily as needed (heartburn or acid reflux).     propafenone (RYTHMOL SR) 225 MG 12 hr capsule Take 1 capsule (225 mg total) by mouth 2 (two) times daily. Start 07/18/21 60 capsule 1   rivaroxaban (XARELTO) 20 MG TABS tablet Take 1 tablet (20 mg total) by mouth daily with supper. 30 tablet 3   No current facility-administered medications for this encounter.    No Known Allergies  Social History   Socioeconomic History   Marital status: Single    Spouse name: Not on file   Number of children: Not on file   Years of education: Not on file   Highest education level: Not on file  Occupational History   Occupation: owns yoga studio    Employer:  [employer]   Occupation: bookkeeper  Tobacco Use   Smoking status: Never   Smokeless tobacco: Never  Substance and Sexual Activity   Alcohol use: Yes    Alcohol/week: 3.0 standard drinks    Types: 3 Cans of beer per week   Drug use: No   Sexual activity: Yes  Other Topics Concern   Not on file  Social History Narrative   Not on file   Social Determinants of Health   Financial Resource Strain: Not on file  Food Insecurity: Not on file  Transportation Needs: Not on file  Physical Activity: Not on file  Stress: Not on file  Social Connections: Not on file  Intimate Partner Violence: Not on file    Family History  Problem Relation Age of Onset   Hypertension Father    Hypertension Brother    Hyperlipidemia Brother    Stroke Mother     ROS- All systems are reviewed and negative except as per the HPI above  Physical Exam: Vitals:   07/25/21 1502  BP: (!) 136/110  Pulse: (!) 122  Weight: 81.8 kg  Height: 5\' 9"  (1.753 m)    Wt Readings from Last 3 Encounters:  07/25/21 81.8 kg  07/19/21 82.5 kg  07/17/21 81.9 kg    Labs: Lab Results  Component Value Date   NA 138 07/10/2021   K 3.9 07/10/2021   CL 108 07/10/2021   CO2 22 07/10/2021    GLUCOSE 83 07/10/2021   BUN 19 07/10/2021   CREATININE 0.95 07/10/2021   CALCIUM 9.2 07/10/2021   MG 2.3 07/10/2021   Lab Results  Component Value Date   INR 2.3 08/09/2009   Lab Results  Component Value Date   CHOL 183 03/07/2021   HDL 46 03/07/2021   LDLCALC 112 (H) 03/07/2021   TRIG 140 03/07/2021    GEN- The patient is a well appearing male, alert and oriented x 3 today.   HEENT-head normocephalic, atraumatic, sclera clear, conjunctiva pink, hearing intact, trachea midline. Lungs- Clear to ausculation bilaterally, normal work of breathing Heart- rapid  regular rate and rhythm, no murmurs, rubs or gallops  GI- soft, NT, ND, + BS Extremities- no clubbing, cyanosis, or edema MS- no significant deformity or atrophy Skin- no rash or lesion Psych- euthymic mood, full affect Neuro- strength and sensation are intact   EKG-  atrial flutter at 122 bpm QRS duration 94 ms QT/QTc 518 ms    Assessment and Plan: 1. H/o Paroxysmal afib /Persistent atrial flutter since last cardioversion 6 days ago   CV was successful x 4 days  H/o afib ablations x 4  Extra Cardizem is not touching his rapid rates  He had aTEE guided Cardioversion as only on anticoagulation x 6 days prior to cardioversion 9/28  Discussed with Dr. Rayann Heman and he suggested Rythmol  225 mg bid  Pt converted x 2 days, but then returned to atrial flutter with RVR 1//10 Drugstore unable to get higher dose of Rythmol for several days and pt feels dizzy on drug, so will stop on Friday and will admit for tikosyn on Monday Dr. Rayann Heman is aware   Continue diltiazem 120 mg daily , use 30 mg Cardizem to control v rates until Monday  He  did not tolerate flecainide in the past vrs it did not work to convert to No  missed anticoagulation, conitnue xarelto 20 mg daily for a CHA2DS2VASc  of 2  Continue Linq monitoring   Follow up here on Monday  for [employer] admit   Geroge Baseman. Tylicia Sherman, [facility] [address] [place], [place] [address] [phone]

## 2021-07-25 NOTE — Telephone Encounter (Signed)
Medication list reviewed in anticipation of upcoming Tikosyn initiation. Patient is not taking any contraindicated or QTc prolonging medications. He was advised to stop propafenone after tomorrow.Admission is 10/17 which is enough time for a washout.  Patient is anticoagulated on Xarelto on the appropriate dose. Please ensure that patient has not missed any anticoagulation doses in the 3 weeks prior to Tikosyn initiation.   Patient will need to be counseled to avoid use of Benadryl while on Tikosyn and in the 2-3 days prior to Tikosyn initiation.

## 2021-07-27 ENCOUNTER — Other Ambulatory Visit: Payer: Self-pay | Admitting: Diagnostic Radiology

## 2021-07-28 LAB — SARS CORONAVIRUS 2 (TAT 6-24 HRS): SARS Coronavirus 2: NEGATIVE

## 2021-07-30 ENCOUNTER — Other Ambulatory Visit (HOSPITAL_COMMUNITY): Payer: Self-pay

## 2021-07-30 ENCOUNTER — Inpatient Hospital Stay (HOSPITAL_COMMUNITY)
Admission: RE | Admit: 2021-07-30 | Discharge: 2021-08-02 | DRG: 310 | Disposition: A | Discharge status: Home / Self Care | Payer: Medicare Other | Source: Ambulatory Visit | Attending: Internal Medicine | Admitting: Internal Medicine

## 2021-07-30 ENCOUNTER — Encounter (HOSPITAL_COMMUNITY): Payer: Self-pay | Admitting: Internal Medicine

## 2021-07-30 ENCOUNTER — Other Ambulatory Visit: Payer: Self-pay

## 2021-07-30 ENCOUNTER — Ambulatory Visit (INDEPENDENT_AMBULATORY_CARE_PROVIDER_SITE_OTHER): Payer: Medicare Other

## 2021-07-30 ENCOUNTER — Encounter (HOSPITAL_COMMUNITY): Payer: Self-pay | Admitting: Physician Assistant

## 2021-07-30 ENCOUNTER — Ambulatory Visit (HOSPITAL_COMMUNITY)
Admission: RE | Admit: 2021-07-30 | Discharge: 2021-07-30 | Disposition: A | Discharge status: Home / Self Care | Payer: Medicare Other | Source: Ambulatory Visit | Attending: Nurse Practitioner | Admitting: Nurse Practitioner

## 2021-07-30 VITALS — BP 122/68 | HR 62 | Ht 69.0 in | Wt 183.4 lb

## 2021-07-30 DIAGNOSIS — I4819 Other persistent atrial fibrillation: Secondary | ICD-10-CM | POA: Diagnosis not present

## 2021-07-30 DIAGNOSIS — Z23 Encounter for immunization: Secondary | ICD-10-CM

## 2021-07-30 DIAGNOSIS — I4892 Unspecified atrial flutter: Secondary | ICD-10-CM | POA: Diagnosis not present

## 2021-07-30 DIAGNOSIS — Z8619 Personal history of other infectious and parasitic diseases: Secondary | ICD-10-CM | POA: Diagnosis not present

## 2021-07-30 DIAGNOSIS — Z823 Family history of stroke: Secondary | ICD-10-CM | POA: Diagnosis not present

## 2021-07-30 DIAGNOSIS — Z79899 Other long term (current) drug therapy: Secondary | ICD-10-CM | POA: Diagnosis not present

## 2021-07-30 DIAGNOSIS — I480 Paroxysmal atrial fibrillation: Secondary | ICD-10-CM | POA: Diagnosis present

## 2021-07-30 DIAGNOSIS — Z20822 Contact with and (suspected) exposure to covid-19: Secondary | ICD-10-CM | POA: Diagnosis not present

## 2021-07-30 DIAGNOSIS — E7800 Pure hypercholesterolemia, unspecified: Secondary | ICD-10-CM | POA: Diagnosis not present

## 2021-07-30 DIAGNOSIS — Z8249 Family history of ischemic heart disease and other diseases of the circulatory system: Secondary | ICD-10-CM | POA: Diagnosis not present

## 2021-07-30 DIAGNOSIS — Z83438 Family history of other disorder of lipoprotein metabolism and other lipidemia: Secondary | ICD-10-CM | POA: Diagnosis not present

## 2021-07-30 DIAGNOSIS — Z7901 Long term (current) use of anticoagulants: Secondary | ICD-10-CM | POA: Diagnosis not present

## 2021-07-30 DIAGNOSIS — D6869 Other thrombophilia: Secondary | ICD-10-CM | POA: Insufficient documentation

## 2021-07-30 LAB — BASIC METABOLIC PANEL
Anion gap: 6 (ref 5–15)
Anion gap: 7 (ref 5–15)
BUN: 13 mg/dL (ref 8–23)
BUN: 16 mg/dL (ref 8–23)
CO2: 25 mmol/L (ref 22–32)
CO2: 22 mmol/L (ref 22–32)
Calcium: 9 mg/dL (ref 8.9–10.3)
Calcium: 8.9 mg/dL (ref 8.9–10.3)
Chloride: 109 mmol/L (ref 98–111)
Chloride: 110 mmol/L (ref 98–111)
Creatinine, Ser: 0.99 mg/dL (ref 0.61–1.24)
Creatinine, Ser: 0.95 mg/dL (ref 0.61–1.24)
GFR, Estimated: >60 mL/min (ref >60)
GFR, Estimated: >60 mL/min (ref >60)
Glucose, Bld: 125 mg/dL — ABNORMAL HIGH (ref 70–99)
Glucose, Bld: 125 mg/dL — ABNORMAL HIGH (ref 70–99)
Potassium: 3.7 mmol/L (ref 3.5–5.1)
Potassium: 4.1 mmol/L (ref 3.5–5.1)
Sodium: 140 mmol/L (ref 135–145)
Sodium: 139 mmol/L (ref 135–145)

## 2021-07-30 LAB — MAGNESIUM: Magnesium: 2.3 mg/dL (ref 1.7–2.4)

## 2021-07-30 LAB — HIV ANTIBODY (ROUTINE TESTING W REFLEX): HIV Screen 4th Generation wRfx: NONREACTIVE

## 2021-07-30 MED ORDER — POTASSIUM CHLORIDE CRYS ER 20 MEQ PO TBCR
20.0000 meq | EXTENDED_RELEASE_TABLET | Freq: Once | ORAL | Status: AC
  Administered 2021-07-30: 20 meq via ORAL
  Filled 2021-07-30: qty 1

## 2021-07-30 MED ORDER — INFLUENZA VAC A&B SA ADJ QUAD 0.5 ML IM PRSY
0.5000 mL | PREFILLED_SYRINGE | INTRAMUSCULAR | Status: AC
  Administered 2021-08-02: 0.5 mL via INTRAMUSCULAR
  Filled 2021-07-30: qty 0.5

## 2021-07-30 MED ORDER — FINASTERIDE 5 MG PO TABS
5.0000 mg | ORAL_TABLET | Freq: Every day | ORAL | Status: DC
  Administered 2021-07-31 – 2021-08-02 (×3): 5 mg via ORAL
  Filled 2021-07-30 (×3): qty 1

## 2021-07-30 MED ORDER — SODIUM CHLORIDE 0.9% FLUSH
3.0000 mL | Freq: Two times a day (BID) | INTRAVENOUS | Status: DC
  Administered 2021-07-31 – 2021-08-01 (×4): 3 mL via INTRAVENOUS

## 2021-07-30 MED ORDER — SODIUM CHLORIDE 0.9 % IV SOLN: 250.0000 mL | INTRAVENOUS | Status: DC | PRN

## 2021-07-30 MED ORDER — POTASSIUM CHLORIDE CRYS ER 20 MEQ PO TBCR
40.0000 meq | EXTENDED_RELEASE_TABLET | Freq: Once | ORAL | Status: AC
  Administered 2021-07-30: 40 meq via ORAL
  Filled 2021-07-30: qty 2

## 2021-07-30 MED ORDER — DILTIAZEM HCL ER COATED BEADS 120 MG PO CP24
120.0000 mg | ORAL_CAPSULE | Freq: Every day | ORAL | Status: DC
  Administered 2021-07-31 – 2021-08-02 (×3): 120 mg via ORAL
  Filled 2021-07-30 (×3): qty 1

## 2021-07-30 MED ORDER — ATORVASTATIN CALCIUM 40 MG PO TABS
40.0000 mg | ORAL_TABLET | Freq: Every day | ORAL | Status: DC
  Administered 2021-07-31 – 2021-08-02 (×3): 40 mg via ORAL
  Filled 2021-07-30 (×3): qty 1

## 2021-07-30 MED ORDER — PANTOPRAZOLE SODIUM 40 MG PO TBEC: 80.0000 mg | DELAYED_RELEASE_TABLET | Freq: Every day | ORAL | Status: DC | PRN

## 2021-07-30 MED ORDER — DILTIAZEM HCL 60 MG PO TABS: 30.0000 mg | ORAL_TABLET | ORAL | Status: DC | PRN

## 2021-07-30 MED ORDER — RIVAROXABAN 20 MG PO TABS
20.0000 mg | ORAL_TABLET | Freq: Every day | ORAL | Status: DC
  Administered 2021-07-30 – 2021-08-01 (×3): 20 mg via ORAL
  Filled 2021-07-30 (×3): qty 1

## 2021-07-30 MED ORDER — DOFETILIDE 500 MCG PO CAPS
500.0000 ug | ORAL_CAPSULE | Freq: Two times a day (BID) | ORAL | Status: DC
  Administered 2021-07-30 – 2021-07-31 (×2): 500 ug via ORAL
  Filled 2021-07-30 (×2): qty 1

## 2021-07-30 MED ORDER — SODIUM CHLORIDE 0.9% FLUSH: 3.0000 mL | INTRAVENOUS | Status: DC | PRN

## 2021-07-30 NOTE — H&P (Addendum)
Electrophysiology H&P  Note   Primary Care Physician: [name], MD Referring Physician: Dr. Wynell Balloon is a 70 y.o. male with a h/o paroxysmal afib that underwent afib ablation x 4, 2 in 2014, one in 2017 and last ablation in 2021.  Patient noted elevated heart rates for the past two weeks. ILR shows an increased afib burden since the end of August. ? If the device is undersensing atrial flutter. He was started on diltiazem and Xarelto on 06/29/21 and his heart racing  resolved with last visit. He then went into persistent atrial flutter with RVR, rate today 156 bpm since 9/22. He feels weak and miserable. He takes 120 mg cadizem daily and has been taking prn cardizem on a regular basis since in rapid flutter, but has touched his rate.  He has only been back on anticoagulation x 6 days so will require TEE/CV so sending hin to ED for urgent cardioversion is not an option.  There were no specific triggers that he could identify. CHA2DS2VASc  score is 2 so he really should stay on anticoagulation long term by guidelinies. BP adequate at 114/90.    Pt returns  today, 07/17/21 for return of atrial flutter at 150 bpm. Cardioversion lasted 4 days. Right now he seems to be tolerating. I am afraid to increase daily Cardizem as it it dropped BP last week when out of rhythm. I discussed with Dr. Rayann Heman how to approach. He suggested 600 mg propafenone today and then 225 mg bid and return to clinic for ekg on Thursday. If this fails then possibly tikosyn,  but Dr. Rayann Heman  is not in favor of another ablation. Pt is on young  side for amiodarone.    F/u in afib clinic for start of Rythmol, 07/19/21.The original plan was to take 300 mg of Rythmol and then start 225 mg ER bid the next day. The pharmacy did not have either dose but could get the 225 mg of Rythmol before he could get the 600 mg dose, would not be for several days. So he just started the 225 mg dose and went back into SR this am, after  dose last pm and this am dose. He is feeling improved, but a little lightheaded  this pm.    Pt being seen in the afib clinic, 10/13,  as he reported going back out of rhythm on Monday, confirmed with Linq and EKG shows rapid aflutter. He is on extended release propafenone 225 mg bid . I discussed with Dr. Rayann Heman who suggested to try to increase to 325 mg bid and bring in on Friday for EKG. Unfortunately, the 2 drugstores where the drug was least expensive, would take 2-3 days to get the drug in. Alsp, he feels very lightheaded on drug, even in SR and I was afraid that he would not tolerate the higher dose. Therefore, will washout drug x 3 days, none after tomorrow and will plan for tikosyn admit next Monday. No benadryl use, no missed anticoagulation and qt in SR was 421 ms.    Followed up in the AF clinic today, presenting for dofetilide admission. He stopped propafenone on 07/26/21. He has not missed any doses of anticoagulation in the last 3 weeks. He converted to SR 07/28/21 and feels well today.    Currently, He denies symptoms of palpitations, chest pain, shortness of breath, orthopnea, PND, lower extremity edema, claudication, dizziness, presyncope, syncope, bleeding, or neurologic sequela. The patient is tolerating medications without difficulties.  Past Medical History:  Diagnosis Date   Atrial fibrillation ([place])      paroxysmal s/p PVI by JA 2010, 2014, 2017   Diverticulosis      Colonoscopy 07/2013   Herpes     History of hepatitis B     Hypercholesterolemia     Varicose veins           Past Surgical History:  Procedure Laterality Date   atrail fibrillation ablation   7/10, 1//17/14    PVI by [place] N/A 10/29/2012    Procedure: ATRIAL FIBRILLATION ABLATION;  Surgeon: Thompson Grayer, MD;  Location: [facility] CATH LAB;  Service: Cardiovascular;  Laterality: N/A;   ATRIAL FIBRILLATION ABLATION N/A 03/24/2020    Procedure: ATRIAL FIBRILLATION ABLATION;   Surgeon: Thompson Grayer, MD;  Location: [place] CV LAB;  Service: Cardiovascular;  Laterality: N/A;   CARDIOVERSION N/A 09/20/2015    Procedure: CARDIOVERSION;  Surgeon: Thayer Headings, MD;  Location: [place];  Service: Cardiovascular;  Laterality: N/A;   CARDIOVERSION N/A 09/28/2015    Procedure: CARDIOVERSION;  Surgeon: Lelon Perla, MD;  Location: [facility] ENDOSCOPY;  Service: Cardiovascular;  Laterality: N/A;   CARDIOVERSION N/A 07/11/2021    Procedure: CARDIOVERSION;  Surgeon: Geralynn Rile, MD;  Location: [place];  Service: Cardiovascular;  Laterality: N/A;   COLONOSCOPY   2004   ELECTROPHYSIOLOGIC STUDY N/A 11/28/2015    3rd AF ablation by Dr Rayann Heman   implantable loop recorder placement   10/11/2019    Medtronic Reveal [place] model LNQ22 (SN [id number]) by Dr Rayann Heman for afib management post ablation   SKIN BIOPSY Right 11/03/2018   TEE WITHOUT CARDIOVERSION   10/29/2012    Procedure: TRANSESOPHAGEAL ECHOCARDIOGRAM (TEE);  Surgeon: Fay Records, MD;  Location: [facility] ENDOSCOPY;  Service: Cardiovascular;  Laterality: N/A;  pre ablation   TEE WITHOUT CARDIOVERSION N/A 09/20/2015    Procedure: TRANSESOPHAGEAL ECHOCARDIOGRAM (TEE);  Surgeon: Thayer Headings, MD;  Location: [place];  Service: Cardiovascular;  Laterality: N/A;   TEE WITHOUT CARDIOVERSION N/A 07/11/2021    Procedure: TRANSESOPHAGEAL ECHOCARDIOGRAM (TEE);  Surgeon: Geralynn Rile, MD;  Location: [place];  Service: Cardiovascular;  Laterality: N/A;   TONSILLECTOMY        when child            Current Outpatient Medications  Medication Sig Dispense Refill   acyclovir (ZOVIRAX) 400 MG tablet TAKE 1 TABLET BY MOUTH THREE TIMES DAILY AS NEEDED AS DIRECTED 30 tablet 0   atorvastatin (LIPITOR) 40 MG tablet TAKE 1 TABLET(40 MG) BY MOUTH DAILY 90 tablet 3   diltiazem (CARDIZEM CD) 120 MG 24 hr capsule Take 1 capsule (120 mg total) by mouth daily. 30 capsule 3   diltiazem (CARDIZEM) 30 MG tablet TAKE 1  TABLET BY MOUTH E 4 HOURS AS NEEDED FOR HR GREATER THAN 100 45 tablet 3   finasteride (PROSCAR) 5 MG tablet TAKE 1 TABLET(5 MG) BY MOUTH DAILY 90 tablet 3   Multiple Vitamin (MULTI VITAMIN PO) Take 1 tablet by mouth daily.        omeprazole (PRILOSEC) 40 MG capsule Take 40 mg by mouth daily as needed (heartburn or acid reflux).       rivaroxaban (XARELTO) 20 MG TABS tablet Take 1 tablet (20 mg total) by mouth daily with supper. 30 tablet 3    No current facility-administered medications for this encounter.      No Known Allergies   Social History  Socioeconomic History   Marital status: Single      Spouse name: Not on file   Number of children: Not on file   Years of education: Not on file   Highest education level: Not on file  Occupational History   Occupation: owns yoga studio      Employer: [employer]   Occupation: bookkeeper  Tobacco Use   Smoking status: Never   Smokeless tobacco: Never  Substance and Sexual Activity   Alcohol use: Yes      Alcohol/week: 3.0 standard drinks      Types: 3 Cans of beer per week   Drug use: No   Sexual activity: Yes  Other Topics Concern   Not on file  Social History Narrative   Not on file    Social Determinants of Health    Financial Resource Strain: Not on file  Food Insecurity: Not on file  Transportation Needs: Not on file  Physical Activity: Not on file  Stress: Not on file  Social Connections: Not on file  Intimate Partner Violence: Not on file           Family History  Problem Relation Age of Onset   Hypertension Father     Hypertension Brother     Hyperlipidemia Brother     Stroke Mother        ROS- All systems are reviewed and negative except as per the HPI above   Physical Exam:    Vitals:    07/30/21 1136  BP: 122/68  Pulse: 62  Weight: 83.2 kg  Height: 5\' 9"  (1.753 m)           Wt Readings from Last 3 Encounters:  07/30/21 83.2 kg  07/25/21 81.8 kg  07/19/21 82.5 kg       Labs: Recent Labs       Lab Results  Component Value Date    NA 138 07/10/2021    K 3.9 07/10/2021    CL 108 07/10/2021    CO2 22 07/10/2021    GLUCOSE 83 07/10/2021    BUN 19 07/10/2021    CREATININE 0.95 07/10/2021    CALCIUM 9.2 07/10/2021    MG 2.3 07/10/2021      Recent Labs       Lab Results  Component Value Date    INR 2.3 08/09/2009      Recent Labs       Lab Results  Component Value Date    CHOL 183 03/07/2021    HDL 46 03/07/2021    LDLCALC 112 (H) 03/07/2021    TRIG 140 03/07/2021        General:  Well appearing. No resp difficulty. HEENT: Normal Neck: Supple. JVP 5-6. Carotids 2+ bilat; no bruits. No thyromegaly or nodule noted. Cor: PMI nondisplaced. RRR, No M/G/R noted Lungs: CTAB, normal effort. Abdomen: Soft, non-tender, non-distended, no HSM. No bruits or masses. +BS   Extremities: No cyanosis, clubbing, or rash. R and LLE no edema.  Neuro: Alert & orientedx3, cranial nerves grossly intact. moves all 4 extremities w/o difficulty. Affect pleasant      EKG-  SR Vent. rate 62 BPM PR interval 178 ms QRS duration 82 ms QT/QTcB 406/412 ms     Assessment and Plan: 1. Paroxysmal afib/Persistent atrial flutter  H/o afib ablations x 4  Patient presents for dofetilide admission  Patient aware of price of dofetilide. Continue Xarelto 20 mg daily, states no missed doses in the last 3 weeks. No recent benadryl  use PharmD has screened medications, propafenone discontinued.  Labs today show creatinine at 0.99, K+ 3.7 and mag 2.3, CrCl calculated at 69.4. Continue diltiazem 120 mg daily , use 30 mg Cardizem PRN for heart racing. Continue xarelto 20 mg daily for a CHA2DS2VASc  of 2  Continue Linq monitoring    He presents to 6e for tikosyn loading. He is in NSR currently, but has been off propafenone for several days.  He will get first dose [facility].    [address]" [place], [place]  07/30/2021 12:50 PM  [facility] [address] [place], [place] [address]   EP Attending  Patient seen and examined. Agree with the findings as noted above by Oda Kilts, PA-C. The patient presents for initiation of dofetilide. He has had a long h/o PAF and multiple ablation. He has stopped his propafenone about 4 days ago and his QT is ok. He will start dofetilide. He has spontaneously reverted back to NSR and we will remain in the hospital for 3.5 days for initiation.   Carleene Overlie Nara Paternoster,MD

## 2021-07-30 NOTE — TOC Benefit Eligibility Note (Signed)
Patient Teacher, English as a foreign language completed.    The patient is currently admitted and upon discharge could be taking dofetilide (Tikosyn) 500 mcg capsule.  The current 30 day co-pay is, $90.00.   The patient is insured through [employer] [place] Medicare Part D     Lyndel Safe, [place] Patient Advocate Specialist [place] Team Direct Number: [phone]  Fax: [phone]

## 2021-07-30 NOTE — Progress Notes (Signed)
Qtc 2 hours post Tikosyn dose 449. Jessie Foot, RN

## 2021-07-30 NOTE — Progress Notes (Addendum)
Pharmacy: Dofetilide (Tikosyn) - Initial Consult Assessment and Electrolyte Replacement  Pharmacy consulted to assist in monitoring and replacing electrolytes in this 70 y.o. male admitted on 07/30/2021 undergoing dofetilide initiation. First dofetilide dose: 10/17  Assessment:  Patient Exclusion Criteria: If any screening criteria checked as "Yes", then  patient  should NOT receive dofetilide until criteria item is corrected.  If "Yes" please indicate correction plan.  YES  NO Patient  Exclusion Criteria Correction Plan   []   [x]   Baseline QTc interval is less than or equal to 440 msec. IF above YES box checked dofetilide contraindicated unless patient has ICD; then may proceed if QTc 500-550 msec or with known ventricular conduction abnormalities may proceed with QTc 550-600 msec. QTc = 414    []   [x]   Patient is known or suspected to have a digoxin level greater than 2 ng/ml: No results found for: DIGOXIN     []   [x]   Creatinine clearance less than 20 ml/min (calculated using Cockcroft-Gault, actual body weight and serum creatinine): Estimated Creatinine Clearance: 69.4 mL/min (by C-G formula based on SCr of 0.99 mg/dL).     []   [x]  Patient has received drugs known to prolong the QT intervals within the last 48 hours (phenothiazines, tricyclics or tetracyclic antidepressants, erythromycin, H-1 antihistamines, cisapride, fluoroquinolones, azithromycin, ondansetron).   Updated information on QT prolonging agents is available to be searched on the following database:QT prolonging agents     []   [x]   Patient received a dose of hydrochlorothiazide (Oretic) alone or in any combination including triamterene (Dyazide, Maxzide) in the last 48 hours.    []   [x]  Patient received a medication known to increase dofetilide plasma concentrations prior to initial dofetilide dose:  Trimethoprim (Primsol, Proloprim) in the last 36 hours Verapamil (Calan, Verelan) in the last 36 hours or a  sustained release dose in the last 72 hours Megestrol (Megace) in the last 5 days  Cimetidine (Tagamet) in the last 6 hours Ketoconazole (Nizoral) in the last 24 hours Itraconazole (Sporanox) in the last 48 hours  Prochlorperazine (Compazine) in the last 36 hours     []   [x]   Patient is known to have a history of torsades de pointes; congenital or acquired long QT syndromes.    []   [x]   Patient has received a Class 1 antiarrhythmic with less than 2 half-lives since last dose. (Disopyramide, Quinidine, Procainamide, Lidocaine, Mexiletine, Flecainide, Propafenone) Propafenone was stopped 07/26/21   []   [x]   Patient has received amiodarone therapy in the past 3 months or amiodarone level is greater than 0.3 ng/ml.    Patient has been appropriately anticoagulated with Xarelto.  Labs:    Component Value Date/Time   K 3.7 07/30/2021 1142   MG 2.3 07/30/2021 1142     Plan: Potassium: -Kdur 53meq given per EP -Will given an additional 31meq Kdur and recheck a BMET at 6pm  Magnesium: Mg >2: Appropriate to initiate Tikosyn, no replacement needed     Thank you for allowing pharmacy to participate in this patient's care   Hildred Laser, PharmD Clinical Pharmacist **Pharmacist phone directory can now be found on [email or web address] ([email or web address]).  Listed under [place].

## 2021-07-30 NOTE — Progress Notes (Signed)
Primary Care Physician: [name], MD Referring Physician: Dr. Wynell Balloon is a 69 y.o. male with a h/o paroxysmal afib that underwent afib ablation x 4, 2 in 2014, one in 2017 and last ablation in 2021.  Patient noted elevated heart rates for the past two weeks. ILR shows an increased afib burden since the end of August. ? If the device is undersensing atrial flutter. He was started on diltiazem and Xarelto on 06/29/21 and his heart racing  resolved with last visit. He then went into persistent atrial flutter with RVR, rate today 156 bpm since 9/22. He feels weak and miserable. He takes 120 mg cadizem daily and has been taking prn cardizem on a regular basis since in rapid flutter, but has touched his rate.  He has only been back on anticoagulation x 6 days so will require TEE/CV so sending hin to ED for urgent cardioversion is not an option.  There were no specific triggers that he could identify. CHA2DS2VASc  score is 2 so he really should stay on anticoagulation long term by guidelinies. BP adequate at 114/90.   Pt returns  today, 07/17/21 for return of atrial flutter at 150 bpm. Cardioversion lasted 4 days. Right now he seems to be tolerating. I am afraid to increase daily Cardizem as it it dropped BP last week when out of rhythm. I discussed with Dr. Rayann Heman how to approach. He suggested 600 mg propafenone today and then 225 mg bid and return to clinic for ekg on Thursday. If this fails then possibly tikosyn,  but Dr. Rayann Heman  is not in favor of another ablation. Pt is on young  side for amiodarone.   F/u in afib clinic for start of Rythmol, 07/19/21.The original plan was to take 300 mg of Rythmol and then start 225 mg ER bid the next day. The pharmacy did not have either dose but could get the 225 mg of Rythmol before he could get the 600 mg dose, would not be for several days. So he just started the 225 mg dose and went back into SR this am, after dose last pm and this am  dose. He is feeling improved, but a little lightheaded  this pm.   Pt being seen in the afib clinic, 10/13,  as he reported going back out of rhythm on Monday, confirmed with Linq and EKG shows rapid aflutter. He is on extended release propafenone 225 mg bid . I discussed with Dr. Rayann Heman who suggested to try to increase to 325 mg bid and bring in on Friday for EKG. Unfortunately, the 2 drugstores where the drug was least expensive, would take 2-3 days to get the drug in. Alsp, he feels very lightheaded on drug, even in SR and I was afraid that he would not tolerate the higher dose. Therefore, will washout drug x 3 days, none after tomorrow and will plan for tikosyn admit next Monday. No benadryl use, no missed anticoagulation and qt in SR was 421 ms.   Follow up in the AF clinic 07/30/21. Patient presents for dofetilide admission. He stopped propafenone on 07/26/21. He has not missed any doses of anticoagulation in the last 3 weeks. He actually converted to SR 07/28/21 and feels well today.   Today, he denies symptoms of palpitations, chest pain, shortness of breath, orthopnea, PND, lower extremity edema, dizziness, presyncope, syncope, or neurologic sequela. The patient is tolerating medications without difficulties and is otherwise without complaint today.  Past Medical History:  Diagnosis Date   Atrial fibrillation ([place])    paroxysmal s/p PVI by JA 2010, 2014, 2017   Diverticulosis    Colonoscopy 07/2013   Herpes    History of hepatitis B    Hypercholesterolemia    Varicose veins    Past Surgical History:  Procedure Laterality Date   atrail fibrillation ablation  7/10, 1//17/14   PVI by [place] N/A 10/29/2012   Procedure: ATRIAL FIBRILLATION ABLATION;  Surgeon: Thompson Grayer, MD;  Location: [facility] CATH LAB;  Service: Cardiovascular;  Laterality: N/A;   ATRIAL FIBRILLATION ABLATION N/A 03/24/2020   Procedure: ATRIAL FIBRILLATION ABLATION;  Surgeon: Thompson Grayer, MD;   Location: [facility] CV LAB;  Service: Cardiovascular;  Laterality: N/A;   CARDIOVERSION N/A 09/20/2015   Procedure: CARDIOVERSION;  Surgeon: Thayer Headings, MD;  Location: [facility];  Service: Cardiovascular;  Laterality: N/A;   CARDIOVERSION N/A 09/28/2015   Procedure: CARDIOVERSION;  Surgeon: Lelon Perla, MD;  Location: [facility] ENDOSCOPY;  Service: Cardiovascular;  Laterality: N/A;   CARDIOVERSION N/A 07/11/2021   Procedure: CARDIOVERSION;  Surgeon: Geralynn Rile, MD;  Location: [place];  Service: Cardiovascular;  Laterality: N/A;   COLONOSCOPY  2004   ELECTROPHYSIOLOGIC STUDY N/A 11/28/2015   3rd AF ablation by Dr Rayann Heman   implantable loop recorder placement  10/11/2019   Medtronic Reveal [place] model LNQ22 (SN [id number]) by Dr Rayann Heman for afib management post ablation   SKIN BIOPSY Right 11/03/2018   TEE WITHOUT CARDIOVERSION  10/29/2012   Procedure: TRANSESOPHAGEAL ECHOCARDIOGRAM (TEE);  Surgeon: Fay Records, MD;  Location: [facility] ENDOSCOPY;  Service: Cardiovascular;  Laterality: N/A;  pre ablation   TEE WITHOUT CARDIOVERSION N/A 09/20/2015   Procedure: TRANSESOPHAGEAL ECHOCARDIOGRAM (TEE);  Surgeon: Thayer Headings, MD;  Location: [place];  Service: Cardiovascular;  Laterality: N/A;   TEE WITHOUT CARDIOVERSION N/A 07/11/2021   Procedure: TRANSESOPHAGEAL ECHOCARDIOGRAM (TEE);  Surgeon: Geralynn Rile, MD;  Location: [place];  Service: Cardiovascular;  Laterality: N/A;   TONSILLECTOMY     when child    Current Outpatient Medications  Medication Sig Dispense Refill   acyclovir (ZOVIRAX) 400 MG tablet TAKE 1 TABLET BY MOUTH THREE TIMES DAILY AS NEEDED AS DIRECTED 30 tablet 0   atorvastatin (LIPITOR) 40 MG tablet TAKE 1 TABLET(40 MG) BY MOUTH DAILY 90 tablet 3   diltiazem (CARDIZEM CD) 120 MG 24 hr capsule Take 1 capsule (120 mg total) by mouth daily. 30 capsule 3   diltiazem (CARDIZEM) 30 MG tablet TAKE 1 TABLET BY MOUTH E 4 HOURS AS NEEDED FOR HR GREATER THAN  100 45 tablet 3   finasteride (PROSCAR) 5 MG tablet TAKE 1 TABLET(5 MG) BY MOUTH DAILY 90 tablet 3   Multiple Vitamin (MULTI VITAMIN PO) Take 1 tablet by mouth daily.      omeprazole (PRILOSEC) 40 MG capsule Take 40 mg by mouth daily as needed (heartburn or acid reflux).     rivaroxaban (XARELTO) 20 MG TABS tablet Take 1 tablet (20 mg total) by mouth daily with supper. 30 tablet 3   No current facility-administered medications for this encounter.    No Known Allergies  Social History   Socioeconomic History   Marital status: Single    Spouse name: Not on file   Number of children: Not on file   Years of education: Not on file   Highest education level: Not on file  Occupational History   Occupation: owns yoga studio  Employer: [employer]   Occupation: bookkeeper  Tobacco Use   Smoking status: Never   Smokeless tobacco: Never  Substance and Sexual Activity   Alcohol use: Yes    Alcohol/week: 3.0 standard drinks    Types: 3 Cans of beer per week   Drug use: No   Sexual activity: Yes  Other Topics Concern   Not on file  Social History Narrative   Not on file   Social Determinants of Health   Financial Resource Strain: Not on file  Food Insecurity: Not on file  Transportation Needs: Not on file  Physical Activity: Not on file  Stress: Not on file  Social Connections: Not on file  Intimate Partner Violence: Not on file    Family History  Problem Relation Age of Onset   Hypertension Father    Hypertension Brother    Hyperlipidemia Brother    Stroke Mother     ROS- All systems are reviewed and negative except as per the HPI above  Physical Exam: Vitals:   07/30/21 1136  BP: 122/68  Pulse: 62  Weight: 83.2 kg  Height: 5\' 9"  (1.753 m)     Wt Readings from Last 3 Encounters:  07/30/21 83.2 kg  07/25/21 81.8 kg  07/19/21 82.5 kg    Labs: Lab Results  Component Value Date   NA 138 07/10/2021   K 3.9 07/10/2021   CL 108 07/10/2021   CO2 22  07/10/2021   GLUCOSE 83 07/10/2021   BUN 19 07/10/2021   CREATININE 0.95 07/10/2021   CALCIUM 9.2 07/10/2021   MG 2.3 07/10/2021   Lab Results  Component Value Date   INR 2.3 08/09/2009   Lab Results  Component Value Date   CHOL 183 03/07/2021   HDL 46 03/07/2021   LDLCALC 112 (H) 03/07/2021   TRIG 140 03/07/2021    GEN- The patient is a well appearing male, alert and oriented x 3 today.   HEENT-head normocephalic, atraumatic, sclera clear, conjunctiva pink, hearing intact, trachea midline. Lungs- Clear to ausculation bilaterally, normal work of breathing Heart- Regular rate and rhythm, no murmurs, rubs or gallops  GI- soft, NT, ND, + BS Extremities- no clubbing, cyanosis, or edema MS- no significant deformity or atrophy Skin- no rash or lesion Psych- euthymic mood, full affect Neuro- strength and sensation are intact   EKG-  SR Vent. rate 62 BPM PR interval 178 ms QRS duration 82 ms QT/QTcB 406/412 ms   Assessment and Plan: 1. Paroxysmal afib/Persistent atrial flutter  H/o afib ablations x 4  Patient presents for dofetilide admission  Patient aware of price of dofetilide. Continue Xarelto 20 mg daily, states no missed doses in the last 3 weeks. No recent benadryl use PharmD has screened medications, propafenone discontinued.  Labs today show creatinine at 0.99, K+ 3.7 and mag 2.3, CrCl calculated at 82 mL/min Continue diltiazem 120 mg daily , use 30 mg Cardizem PRN for heart racing. Continue xarelto 20 mg daily for a CHA2DS2VASc  of 2  Continue Linq monitoring    To be admitted later today once a bed becomes available.    [facility] [address] [place], [place] [address] [phone]

## 2021-07-30 NOTE — Care Management (Signed)
1432 07-30-21 Patient presented for Tikosyn Load. Case Manager will speak with patient in regards to pharmacy of choice and cost.

## 2021-07-31 ENCOUNTER — Ambulatory Visit (HOSPITAL_COMMUNITY): Payer: Medicare Other | Admitting: Nurse Practitioner

## 2021-07-31 DIAGNOSIS — I4819 Other persistent atrial fibrillation: Secondary | ICD-10-CM | POA: Diagnosis not present

## 2021-07-31 LAB — BASIC METABOLIC PANEL
Anion gap: 7 (ref 5–15)
BUN: 16 mg/dL (ref 8–23)
CO2: 24 mmol/L (ref 22–32)
Calcium: 8.8 mg/dL — ABNORMAL LOW (ref 8.9–10.3)
Chloride: 108 mmol/L (ref 98–111)
Creatinine, Ser: 1.03 mg/dL (ref 0.61–1.24)
GFR, Estimated: >60 mL/min (ref >60)
Glucose, Bld: 85 mg/dL (ref 70–99)
Potassium: 4.2 mmol/L (ref 3.5–5.1)
Sodium: 139 mmol/L (ref 135–145)

## 2021-07-31 LAB — GLUCOSE, CAPILLARY: Glucose-Capillary: 100 mg/dL — ABNORMAL HIGH (ref 70–99)

## 2021-07-31 LAB — MAGNESIUM: Magnesium: 2.1 mg/dL (ref 1.7–2.4)

## 2021-07-31 MED ORDER — DOFETILIDE 250 MCG PO CAPS
250.0000 ug | ORAL_CAPSULE | Freq: Two times a day (BID) | ORAL | Status: DC
  Administered 2021-07-31: 250 ug via ORAL
  Filled 2021-07-31: qty 1

## 2021-07-31 NOTE — Progress Notes (Addendum)
Morning EKG reviewed  Shows remains in NSR at 51 bpm with borderline QTc at ~490+ ms when measured manually and corrected for rate  Continue Tikosyn 500 mcg BID for now, will confirm evening dose with Dr. Lovena Le   Pt will not require DCCV as he is currently maintaining NSR   [name]  Pager: [phone]  07/31/2021 11:09 AM   Reviewed ECG with Dr. Lovena Le. Given QTc prolongation from first dose and prolongation over the borderline of 500 ms in some leads, will decrease dose to 250 mcg BID.   Legrand Como [address]" [place], PA-C  07/31/2021 12:10 PM

## 2021-07-31 NOTE — Progress Notes (Signed)
Pharmacy: Dofetilide (Tikosyn) - Follow Up Assessment and Electrolyte Replacement  Pharmacy consulted to assist in monitoring and replacing electrolytes in this 70 y.o. male admitted on 07/30/2021 undergoing dofetilide initiation. First dofetilide dose: 07/30/21  Labs:    Component Value Date/Time   K 4.2 07/31/2021 0238   MG 2.1 07/31/2021 0238     Plan: Potassium: K >/= 4: No additional supplementation needed  Magnesium: Mg > 2: No additional supplementation needed    Thank you for allowing pharmacy to participate in this patient's care   Hildred Laser, PharmD Clinical Pharmacist **Pharmacist phone directory can now be found on [email or web address] ([email or web address]).  Listed under [place].

## 2021-07-31 NOTE — Care Management (Signed)
7124 07-31-21 Case Manager spoke with the patient regarding Tikosyn cost; patient feels that $90.00 is expensive and has opted to use the Good Rx. Patient wants his initial Rx to be escribed to the [facility] and refills to be sent to [facility] on [address] in [place]. No further needs from Case Manager at this time.

## 2021-07-31 NOTE — Progress Notes (Addendum)
Electrophysiology Rounding Note  Patient Name: Maurice [name] Date of Encounter: 07/31/2021  Primary Cardiologist: None  Electrophysiologist: Dr. Rayann Heman   Subjective   Pt  remains in NSR  on Tikosyn 500 mcg BID   QTc from EKG last pm auto-read as stable QTc at ~440-450, but appears more prolonged manually, closer to ~490.  The patient is doing well today.  At this time, the patient denies chest pain, shortness of breath, or any new concerns.  Inpatient Medications    Scheduled Meds:  atorvastatin  40 mg Oral Daily   diltiazem  120 mg Oral Daily   dofetilide  500 mcg Oral BID   finasteride  5 mg Oral Daily   influenza vaccine adjuvanted  0.5 mL Intramuscular Tomorrow-1000   rivaroxaban  20 mg Oral Q supper   sodium chloride flush  3 mL Intravenous Q12H   Continuous Infusions:  sodium chloride     PRN Meds: sodium chloride, diltiazem, pantoprazole, sodium chloride flush   Vital Signs    Vitals:   07/30/21 1236 07/30/21 2034 07/31/21 0500  BP: 135/75  115/70  Pulse: 74 62 (!) 58  Resp: 16 16 16   Temp: 98.4 F (36.9 C) 98.7 F (37.1 C) 98 F (36.7 C)  TempSrc: Oral Oral Oral  SpO2: 96%  96%  Weight: 83 kg    Height: 5\' 9"  (1.753 m)     No intake or output data in the 24 hours ending 07/31/21 0704 Filed Weights   07/30/21 1236  Weight: 83 kg    Physical Exam    GEN- The patient is well appearing, alert and oriented x 3 today.   Head- normocephalic, atraumatic Eyes-  Sclera clear, conjunctiva pink Ears- hearing intact Oropharynx- clear Neck- supple Lungs- Clear to ausculation bilaterally, normal work of breathing Heart- Regular rate and rhythm, no murmurs, rubs or gallops GI- soft, NT, ND, + BS Extremities- no clubbing, cyanosis, or edema Skin- no rash or lesion Psych- euthymic mood, full affect Neuro- strength and sensation are intact  Labs    CBC No results for input(s): WBC, NEUTROABS, HGB, HCT, MCV, PLT in the last 72 hours. Basic  Metabolic Panel Recent Labs    07/30/21 1142 07/30/21 1752 07/31/21 0238  NA 140 139 139  K 3.7 4.1 4.2  CL 109 110 108  CO2 25 22 24   GLUCOSE 125* 125* 85  BUN 13 16 16   CREATININE 0.99 0.95 1.03  CALCIUM 9.0 8.9 8.8*  MG 2.3  --  2.1    Potassium  Date/Time Value Ref Range Status  07/31/2021 02:38 AM 4.2 3.5 - 5.1 mmol/L Final   Magnesium  Date/Time Value Ref Range Status  07/31/2021 02:38 AM 2.1 1.7 - 2.4 mg/dL Final    Comment:    Performed at [facility], [place] [address] [place], [place] [address]    Telemetry    Sinus bradycardia/NSR 50-60s (personally reviewed)  Radiology    No results found.  Patient Profile     Maurice [name] is a 70 y.o. male with a past medical history significant for persistent atrial fibrillation.  They were admitted for tikosyn load.   Assessment & Plan    Persistent atrial fibrillation/atrial flutter Pt  remains in NSR  on Tikosyn 500 mcg BID. QTc borderline. Follow closely for dose reduction. Continue Xarelto Electrolytes stable. K 4.2, Mg 2.1 CHA2DS2VASC is at least 2.  Will not likely require [facility] but will follow through today.   For  questions or updates, please contact [place] Please consult [email or web address] for contact info under Cardiology/STEMI.  Signed, [name], PA-C  07/31/2021, 7:04 AM   EP Attending  Patient seen and examined. Agree with above. The patient's QT interval is out a bit. We will follow and if his QT increases further we will need to reduce his dose of dofetilide.   Carleene Overlie Mikell Camp,MD

## 2021-08-01 ENCOUNTER — Ambulatory Visit (HOSPITAL_COMMUNITY): Payer: Medicare Other | Admitting: Nurse Practitioner

## 2021-08-01 DIAGNOSIS — I4819 Other persistent atrial fibrillation: Secondary | ICD-10-CM | POA: Diagnosis not present

## 2021-08-01 LAB — BASIC METABOLIC PANEL
Anion gap: 7 (ref 5–15)
BUN: 19 mg/dL (ref 8–23)
CO2: 22 mmol/L (ref 22–32)
Calcium: 8.4 mg/dL — ABNORMAL LOW (ref 8.9–10.3)
Chloride: 102 mmol/L (ref 98–111)
Creatinine, Ser: 1.01 mg/dL (ref 0.61–1.24)
GFR, Estimated: >60 mL/min (ref >60)
Glucose, Bld: 106 mg/dL — ABNORMAL HIGH (ref 70–99)
Potassium: 4 mmol/L (ref 3.5–5.1)
Sodium: 131 mmol/L — ABNORMAL LOW (ref 135–145)

## 2021-08-01 LAB — CUP PACEART REMOTE DEVICE CHECK
Date Time Interrogation Session: 20221019143131
Implantable Pulse Generator Implant Date: 20201228

## 2021-08-01 LAB — MAGNESIUM: Magnesium: 2.2 mg/dL (ref 1.7–2.4)

## 2021-08-01 MED ORDER — DOFETILIDE 250 MCG PO CAPS
250.0000 ug | ORAL_CAPSULE | Freq: Two times a day (BID) | ORAL | Status: DC
Start: 2021-08-01 — End: 2021-08-02
  Administered 2021-08-01 – 2021-08-02 (×3): 250 ug via ORAL
  Filled 2021-08-01 (×3): qty 1

## 2021-08-01 MED ORDER — DOFETILIDE 125 MCG PO CAPS
125.0000 ug | ORAL_CAPSULE | Freq: Two times a day (BID) | ORAL | Status: DC
Start: 2021-08-01 — End: 2021-08-01
  Filled 2021-08-01: qty 1

## 2021-08-01 NOTE — Progress Notes (Addendum)
Electrophysiology Rounding Note  Patient Name: Maurice [name] Date of Encounter: 08/01/2021  Primary Cardiologist: None  Electrophysiologist: Dr. Rayann Heman   Subjective   Pt  remains in NSR  on Tikosyn 250 mcg BID   QTc from EKG last pm shows  borderline to prolonged QTc  right at ~500 ms.  The patient is doing well today.  At this time, the patient denies chest pain, shortness of breath, or any new concerns.  Inpatient Medications    Scheduled Meds:  atorvastatin  40 mg Oral Daily   diltiazem  120 mg Oral Daily   dofetilide  250 mcg Oral BID   finasteride  5 mg Oral Daily   influenza vaccine adjuvanted  0.5 mL Intramuscular Tomorrow-1000   rivaroxaban  20 mg Oral Q supper   sodium chloride flush  3 mL Intravenous Q12H   Continuous Infusions:  sodium chloride     PRN Meds: sodium chloride, diltiazem, pantoprazole, sodium chloride flush   Vital Signs    Vitals:   07/31/21 1448 07/31/21 1623 07/31/21 1913 08/01/21 0533  BP: 112/60 121/74 113/69 112/73  Pulse: (!) 58 61 (!) 57 67  Resp: 17 17 18 15   Temp: 97.9 F (36.6 C) 97.9 F (36.6 C) 98.2 F (36.8 C) (!) 97.2 F (36.2 C)  TempSrc: Oral Oral Oral Oral  SpO2: 98% 97% 99%   Weight:      Height:        Intake/Output Summary (Last 24 hours) at 08/01/2021 0714 Last data filed at 07/31/2021 2048 Gross per 24 hour  Intake 6 ml  Output --  Net 6 ml   Filed Weights   07/30/21 1236  Weight: 83 kg    Physical Exam    GEN- The patient is well appearing, alert and oriented x 3 today.   Head- normocephalic, atraumatic Eyes-  Sclera clear, conjunctiva pink Ears- hearing intact Oropharynx- clear Neck- supple Lungs- Clear to ausculation bilaterally, normal work of breathing Heart-  Slow, regular  rate and rhythm, no murmurs, rubs or gallops GI- soft, NT, ND, + BS Extremities- no clubbing, cyanosis, or edema Skin- no rash or lesion Psych- euthymic mood, full affect Neuro- strength and sensation are  intact  Labs    CBC No results for input(s): WBC, NEUTROABS, HGB, HCT, MCV, PLT in the last 72 hours. Basic Metabolic Panel Recent Labs    07/31/21 0238 08/01/21 0452  NA 139 131*  K 4.2 4.0  CL 108 102  CO2 24 22  GLUCOSE 85 106*  BUN 16 19  CREATININE 1.03 1.01  CALCIUM 8.8* 8.4*  MG 2.1 2.2    Potassium  Date/Time Value Ref Range Status  08/01/2021 04:52 AM 4.0 3.5 - 5.1 mmol/L Final   Magnesium  Date/Time Value Ref Range Status  08/01/2021 04:52 AM 2.2 1.7 - 2.4 mg/dL Final    Comment:    Performed at [facility], [place] [address] [place], [place] [address]    Telemetry    Sinus bradycardia/NSR 50-60s (personally reviewed)  Radiology    No results found.   Patient Profile     Maurice [name] is a 70 y.o. male with a past medical history significant for persistent atrial fibrillation.  They were admitted for tikosyn load.   Assessment & Plan    Persistent atrial fibrillation Pt  remains in NSR  on Tikosyn 250 mcg BID  Continue Xarelto Electrolytes stable. K 4.0, Mg 2.2 CHA2DS2VASC is at least 2. QTc is  borderline prolonged. May either need dose reduction or may be able to try one more dose of 250 to let his am dose of 500 wear off. Will review with Dr. Rayann Heman.   ADDENDUM QTc tele this am ~450 ms.  By EKG ~440-450. Will proceed with dose of 250 mcg dose this am and continue to follow closely.  For questions or updates, please contact [place] Please consult [email or web address] for contact info under Cardiology/STEMI.  Signed, [name], PA-C  08/01/2021, 7:14 AM    I have seen, examined the patient, and reviewed the above assessment and plan.  Changes to above are made where necessary.  On exam, RRR.  Qt has improved with reduced tikosyn.  We will continue tikosyn 250 mcg BID and follow QT closely.  I discussed at length with the patient today.  Co Sign: Thompson Grayer, MD 08/01/2021 12:30 PM

## 2021-08-01 NOTE — Progress Notes (Signed)
Pharmacy: Dofetilide (Tikosyn) - Follow Up Assessment and Electrolyte Replacement  Pharmacy consulted to assist in monitoring and replacing electrolytes in this 70 y.o. male admitted on 07/30/2021 undergoing dofetilide initiation. First dofetilide dose: 07/30/21  Labs:    Component Value Date/Time   K 4.0 08/01/2021 0452   MG 2.2 08/01/2021 0452     Plan: Potassium: K >/= 4: No additional supplementation needed  Magnesium: Mg > 2: No additional supplementation needed    Thank you for allowing pharmacy to participate in this patient's care   Hildred Laser, PharmD Clinical Pharmacist **Pharmacist phone directory can now be found on [email or web address] (PW [email or web address]).  Listed under [place].

## 2021-08-01 NOTE — Progress Notes (Signed)
Morning EKG reviewed  Shows remains in NSR at 58 bpm with QTc that has stabilized ~ 450-460 ms.  Continue Tikosyn 250 mcg BID.   Plan home tomorrow if QTc remains stable.     [name], PA-C  Pager: [phone]  08/01/2021 11:31 AM

## 2021-08-02 ENCOUNTER — Other Ambulatory Visit (HOSPITAL_COMMUNITY): Payer: Self-pay

## 2021-08-02 DIAGNOSIS — I4819 Other persistent atrial fibrillation: Secondary | ICD-10-CM | POA: Diagnosis not present

## 2021-08-02 LAB — BASIC METABOLIC PANEL
Anion gap: 8 (ref 5–15)
BUN: 21 mg/dL (ref 8–23)
CO2: 23 mmol/L (ref 22–32)
Calcium: 8.9 mg/dL (ref 8.9–10.3)
Chloride: 106 mmol/L (ref 98–111)
Creatinine, Ser: 0.95 mg/dL (ref 0.61–1.24)
GFR, Estimated: >60 mL/min (ref >60)
Glucose, Bld: 99 mg/dL (ref 70–99)
Potassium: 4 mmol/L (ref 3.5–5.1)
Sodium: 137 mmol/L (ref 135–145)

## 2021-08-02 LAB — MAGNESIUM: Magnesium: 2.3 mg/dL (ref 1.7–2.4)

## 2021-08-02 MED ORDER — DOFETILIDE 250 MCG PO CAPS
250.0000 ug | ORAL_CAPSULE | Freq: Two times a day (BID) | ORAL | 6 refills | Status: DC
  Filled 2021-08-02: qty 60, 30d supply, fill #0

## 2021-08-02 MED ORDER — COVID-19MRNA BIVAL VACC PFIZER 30 MCG/0.3ML IM SUSP
0.3000 mL | Freq: Once | INTRAMUSCULAR | Status: AC
  Administered 2021-08-02: 0.3 mL via INTRAMUSCULAR
  Filled 2021-08-02: qty 0.3

## 2021-08-02 NOTE — Progress Notes (Signed)
EKG from yesterday evening 10/19 reviewed    Shows remains in NSR at 59 bpm with stable QTc at ~450-460 ms.  Continue Tikosyn 250 mcg BID.   Plan for home today if QTc remains stable after am dose.     [name], PA-C  Pager: [phone]  08/02/2021 7:25 AM

## 2021-08-02 NOTE — Progress Notes (Signed)
Pharmacy: Dofetilide (Tikosyn) - Follow Up Assessment and Electrolyte Replacement  Pharmacy consulted to assist in monitoring and replacing electrolytes in this 70 y.o. male admitted on 07/30/2021 undergoing dofetilide initiation. First dofetilide dose: 10/17  Labs:    Component Value Date/Time   K 4.0 08/02/2021 0303   MG 2.3 08/02/2021 0303     Plan: Potassium: K >/= 4: No additional supplementation needed  Magnesium: Mg > 2: No additional supplementation needed   Thank you for allowing pharmacy to participate in this patient's care    Hildred Laser, PharmD Clinical Pharmacist **Pharmacist phone directory can now be found on [email or web address] (PW [email or web address]).  Listed under [place].

## 2021-08-02 NOTE — Care Management Important Message (Signed)
Important Message  Patient Details  Name: Maurice [name] MRN: [id number] Date of Birth: July 30, 1951   Medicare Important Message Given:  Yes     Shelda Altes 08/02/2021, 9:36 AM

## 2021-08-02 NOTE — Discharge Summary (Addendum)
ELECTROPHYSIOLOGY PROCEDURE DISCHARGE SUMMARY    Patient ID: Maurice [name],  MRN: [id number], DOB/AGE: [birth year] 70 y.o.  Admit date: 07/30/2021 Discharge date: 08/02/2021  Primary Care Physician: [name], MD  Primary Cardiologist: None  Electrophysiologist: Dr. Rayann Heman  Primary Discharge Diagnosis:  1.  Persistent atrial fibrillation status post Tikosyn loading this admission  No Known Allergies   Procedures This Admission:  1.  Tikosyn loading  Brief HPI: Maurice [name] is a 70 y.o. male with a past medical history as noted above.  They were referred to EP in the outpatient setting for treatment options of atrial fibrillation.  Risks, benefits, and alternatives to Tikosyn were reviewed with the patient who wished to proceed.    Hospital Course:  The patient was admitted and Tikosyn was initiated.  Renal function and electrolytes were followed during the hospitalization.  He had QT prolongation on the 500 mcg BID dose, which improved on the 250 mcg dose. He presented in NSR/sinus brady and did NOT require cardioversion. They were monitored until discharge on telemetry which demonstrated sinus brady/NSR.  On the day of discharge, they were examined by Dr. Rayann Heman  who considered them stable for discharge to home.  Follow-up has been arranged with the Atrial Fibrillation clinic in approximately 1 week and with  EP APP  in 4 weeks.   Physical Exam: Vitals:   08/01/21 1334 08/01/21 2204 08/02/21 0600 08/02/21 0811  BP: 118/76 120/73 97/63 123/82  Pulse: 60 60 60   Resp: 18 17 18    Temp: 98 F (36.7 C) 98 F (36.7 C) (!) 97.3 F (36.3 C)   TempSrc: Oral Oral Oral   SpO2:      Weight:      Height:        GEN- The patient is well appearing, alert and oriented x 3 today.   HEENT: normocephalic, atraumatic; sclera clear, conjunctiva pink; hearing intact; oropharynx clear; neck supple, no JVP Lymph- no cervical lymphadenopathy Lungs- Clear to ausculation  bilaterally, normal work of breathing.  No wheezes, rales, rhonchi Heart- Regular rate and rhythm, no murmurs, rubs or gallops, PMI not laterally displaced GI- soft, non-tender, non-distended, bowel sounds present, no hepatosplenomegaly Extremities- no clubbing, cyanosis, or edema; DP/PT/radial pulses 2+ bilaterally MS- no significant deformity or atrophy Skin- warm and dry, no rash or lesion Psych- euthymic mood, full affect Neuro- strength and sensation are intact   Labs:   Lab Results  Component Value Date   WBC 8.3 07/10/2021   HGB 15.9 07/10/2021   HCT 48.5 07/10/2021   MCV 90.0 07/10/2021   PLT 205 07/10/2021    Recent Labs  Lab 08/02/21 0303  NA 137  K 4.0  CL 106  CO2 23  BUN 21  CREATININE 0.95  CALCIUM 8.9  GLUCOSE 99     Discharge Medications:  Allergies as of 08/02/2021   No Known Allergies      Medication List     TAKE these medications    acyclovir 400 MG tablet Commonly known as: ZOVIRAX TAKE 1 TABLET BY MOUTH THREE TIMES DAILY AS NEEDED AS DIRECTED What changed: See the new instructions.   atorvastatin 40 MG tablet Commonly known as: LIPITOR TAKE 1 TABLET(40 MG) BY MOUTH DAILY What changed:  how much to take how to take this when to take this additional instructions   diltiazem 120 MG 24 hr capsule Commonly known as: Cardizem CD Take 1 capsule (120 mg total) by mouth daily.  diltiazem 30 MG tablet Commonly known as: CARDIZEM TAKE 1 TABLET BY MOUTH E 4 HOURS AS NEEDED FOR HR GREATER THAN 100 What changed:  how much to take how to take this when to take this reasons to take this additional instructions   dofetilide 250 MCG capsule Commonly known as: TIKOSYN Take 1 capsule (250 mcg total) by mouth 2 (two) times daily.   finasteride 5 MG tablet Commonly known as: PROSCAR TAKE 1 TABLET(5 MG) BY MOUTH DAILY What changed:  how much to take how to take this when to take this additional instructions   MULTI VITAMIN PO Take  1 tablet by mouth daily.   omeprazole 40 MG capsule Commonly known as: PRILOSEC Take 40 mg by mouth daily as needed (heartburn or acid reflux).   rivaroxaban 20 MG Tabs tablet Commonly known as: XARELTO Take 1 tablet (20 mg total) by mouth daily with supper.        Disposition:    Follow-up Information     [facility] Follow up.   Specialty: Cardiology Why: on 1027 at 1130 for post hospital tikosyn visit Contact information: [address] [id number] [place] [phone]                Duration of Discharge Encounter: Greater than 30 minutes including physician time.  Signed, [name], PA-C  08/02/2021 11:26 AM    I have seen, examined the patient, and reviewed the above assessment and plan.  Changes to above are made where necessary.  On exam, RRR.  QT is stable s/p tikosyn load.  DC to home with close follow-up in the AF clinic.  Co Sign: Thompson Grayer, MD 08/02/2021 1:04 PM

## 2021-08-02 NOTE — Plan of Care (Signed)

## 2021-08-03 ENCOUNTER — Telehealth: Payer: Self-pay

## 2021-08-03 NOTE — Telephone Encounter (Signed)
Transition Care Management Follow-up Telephone Call Date of discharge and from where: [facility] 08/02/21 How have you been since you were released from the hospital? [place]  Any questions or concerns? No  Items Reviewed: Did the pt receive and understand the discharge instructions provided? Yes  Medications obtained and verified? Yes  Other? No  Any new allergies since your discharge? No  Dietary orders reviewed? No Do you have support at home? Yes   Home Care and Equipment/Supplies: Were home health services ordered? no  Functional Questionnaire: (I = Independent and D = Dependent) ADLs: I  Bathing/Dressing- I  Meal Prep- I  Eating- I  Maintaining continence- I  Transferring/Ambulation- I  Managing Meds- I  Follow up appointments reviewed:  PCP Hospital f/u appt confirmed? No   Specialist Hospital f/u appt confirmed? Yes  Scheduled to see A-fib clinic  on 08/09/21 @ 11:30. Are transportation arrangements needed? No  If their condition worsens, is the pt aware to call PCP or go to the Emergency Dept.? Yes Was the patient provided with contact information for the PCP's office or ED? Yes Was to pt encouraged to call back with questions or concerns? Yes

## 2021-08-07 NOTE — Progress Notes (Signed)
Carelink Summary Report / Loop Recorder 

## 2021-08-09 ENCOUNTER — Other Ambulatory Visit (HOSPITAL_COMMUNITY): Payer: Self-pay | Admitting: *Deleted

## 2021-08-09 ENCOUNTER — Ambulatory Visit (HOSPITAL_COMMUNITY)
Admission: RE | Admit: 2021-08-09 | Discharge: 2021-08-09 | Disposition: A | Discharge status: Home / Self Care | Payer: Medicare Other | Source: Ambulatory Visit | Attending: Nurse Practitioner | Admitting: Nurse Practitioner

## 2021-08-09 ENCOUNTER — Other Ambulatory Visit: Payer: Self-pay

## 2021-08-09 VITALS — BP 134/76 | HR 49 | Ht 69.0 in | Wt 182.4 lb

## 2021-08-09 DIAGNOSIS — Z7901 Long term (current) use of anticoagulants: Secondary | ICD-10-CM | POA: Diagnosis not present

## 2021-08-09 DIAGNOSIS — I4892 Unspecified atrial flutter: Secondary | ICD-10-CM | POA: Insufficient documentation

## 2021-08-09 DIAGNOSIS — I480 Paroxysmal atrial fibrillation: Secondary | ICD-10-CM | POA: Diagnosis not present

## 2021-08-09 DIAGNOSIS — Z8249 Family history of ischemic heart disease and other diseases of the circulatory system: Secondary | ICD-10-CM | POA: Diagnosis not present

## 2021-08-09 DIAGNOSIS — I4819 Other persistent atrial fibrillation: Secondary | ICD-10-CM

## 2021-08-09 DIAGNOSIS — Z79899 Other long term (current) drug therapy: Secondary | ICD-10-CM | POA: Diagnosis not present

## 2021-08-09 DIAGNOSIS — D6869 Other thrombophilia: Secondary | ICD-10-CM | POA: Diagnosis not present

## 2021-08-09 LAB — BASIC METABOLIC PANEL
Anion gap: 6 (ref 5–15)
BUN: 17 mg/dL (ref 8–23)
CO2: 26 mmol/L (ref 22–32)
Calcium: 9.2 mg/dL (ref 8.9–10.3)
Chloride: 105 mmol/L (ref 98–111)
Creatinine, Ser: 0.98 mg/dL (ref 0.61–1.24)
GFR, Estimated: >60 mL/min (ref >60)
Glucose, Bld: 79 mg/dL (ref 70–99)
Potassium: 3.9 mmol/L (ref 3.5–5.1)
Sodium: 137 mmol/L (ref 135–145)

## 2021-08-09 LAB — MAGNESIUM: Magnesium: 2.2 mg/dL (ref 1.7–2.4)

## 2021-08-09 MED ORDER — DOFETILIDE 250 MCG PO CAPS: 250.0000 ug | ORAL_CAPSULE | Freq: Two times a day (BID) | ORAL | 1 refills | Status: DC

## 2021-08-09 MED ORDER — POTASSIUM CHLORIDE ER 10 MEQ PO TBCR: 10.0000 meq | EXTENDED_RELEASE_TABLET | Freq: Every day | ORAL | 3 refills | Status: DC

## 2021-08-09 NOTE — Progress Notes (Signed)
Primary Care Physician: Maurice Lung, MD Referring Physician: Dr. Wynell [name] is a 70 y.o. male with a h/o paroxysmal afib that underwent afib ablation x 4, 2 in 2014, one in 2017 and last ablation in 2021.  Patient noted elevated heart rates for the past two weeks. ILR shows an increased afib burden since the end of August. ? If the device is undersensing atrial flutter. He was started on diltiazem and Xarelto on 06/29/21 and his heart racing  resolved with last visit. He then went into persistent atrial flutter with RVR, rate today 156 bpm since 9/22. He feels weak and miserable. He takes 120 mg cadizem daily and has been taking prn cardizem on a regular basis since in rapid flutter, but has touched his rate.  He has only been back on anticoagulation x 6 days so will require TEE/CV so sending hin to ED for urgent cardioversion is not an option.  There were no specific triggers that he could identify. CHA2DS2VASc  score is 2 so he really should stay on anticoagulation long term by guidelinies. BP adequate at 114/90.   Pt returns  today, 07/17/21 for return of atrial flutter at 150 bpm. Cardioversion lasted 4 days. Right now he seems to be tolerating. I am afraid to increase daily Cardizem as it it dropped BP last week when out of rhythm. I discussed with Dr. Rayann Heman how to approach. He suggested 600 mg propafenone today and then 225 mg bid and return to clinic for ekg on Thursday. If this fails then possibly tikosyn,  but Dr. Rayann Heman  is not in favor of another ablation. Pt is on young  side for amiodarone.   F/u in afib clinic for start of Rythmol, 07/19/21.The original plan was to take 300 mg of Rythmol and then start 225 mg ER bid the next day. The pharmacy did not have either dose but could get the 225 mg of Rythmol before he could get the 600 mg dose, would not be for several days. So he just started the 225 mg dose and went back into SR this am, after dose last pm and this am  dose. He is feeling improved, but a little lightheaded  this pm.   Pt being seen in the afib clinic, 10/13,  as he reported going back out of rhythm on Monday, confirmed with Linq and EKG shows rapid aflutter. He is on extended release propafenone 225 mg bid . I discussed with Dr. Rayann Heman who suggested to try to increase to 325 mg bid and bring in on Friday for EKG. Unfortunately, the 2 drugstores where the drug was least expensive, would take 2-3 days to get the drug in. Alsp, he feels very lightheaded on drug, even in SR and I was afraid that he would not tolerate the higher dose. Therefore, will washout drug x 3 days, none after tomorrow and will plan for tikosyn admit next Monday. No benadryl use, no missed anticoagulation and qt in SR was 421 ms.   Follow up in the AF clinic 07/30/21. Patient presents for dofetilide admission. He stopped propafenone on 07/26/21. He has not missed any doses of anticoagulation in the last 3 weeks. He actually converted to SR 07/28/21 and feels well today.   F/u in afib clinic, 08/09/21. He is now on tikosyn 250 mcg bid. He is in sinus brady running slightly lower today in the 40's. At home via his apple watch, he has been in the 50's  bpm range. He noted some afib over the weekend. He is being complaint with tikosyn.   Today, he denies symptoms of palpitations, chest pain, shortness of breath, orthopnea, PND, lower extremity edema, dizziness, presyncope, syncope, or neurologic sequela. The patient is tolerating medications without difficulties and is otherwise without complaint today.   Past Medical History:  Diagnosis Date   Atrial fibrillation ([place])    paroxysmal s/p PVI by JA 2010, 2014, 2017   Diverticulosis    Colonoscopy 07/2013   Herpes    History of hepatitis B    Hypercholesterolemia    Varicose veins    Past Surgical History:  Procedure Laterality Date   atrail fibrillation ablation  7/10, 1//17/14   PVI by [facility] N/A  10/29/2012   Procedure: ATRIAL FIBRILLATION ABLATION;  Surgeon: Thompson Grayer, MD;  Location[facility] CATH LAB;  Service: Cardiovascular;  Laterality: N/A;   ATRIAL FIBRILLATION ABLATION N/A 03/24/2020   Procedure: ATRIAL FIBRILLATION ABLATION;  Surgeon: Thompson Grayer, MD;  Location: [facility] CV LAB;  Service: Cardiovascular;  Laterality: N/A;   CARDIOVERSION N/A 09/20/2015   Procedure: CARDIOVERSION;  Surgeon: Thayer Headings, MD;  Location: [facility];  Service: Cardiovascular;  Laterality: N/A;   CARDIOVERSION N/A 09/28/2015   Procedure: CARDIOVERSION;  Surgeon: Lelon Perla, MD;  Location: [facility] ENDOSCOPY;  Service: Cardiovascular;  Laterality: N/A;   CARDIOVERSION N/A 07/11/2021   Procedure: CARDIOVERSION;  Surgeon: Geralynn Rile, MD;  Location: [facility];  Service: Cardiovascular;  Laterality: N/A;   COLONOSCOPY  2004   ELECTROPHYSIOLOGIC STUDY N/A 11/28/2015   3rd AF ablation by Dr Rayann Heman   implantable loop recorder placement  10/11/2019   Medtronic Reveal [place] model LNQ22 (SN [id number]) by Dr Rayann Heman for afib management post ablation   SKIN BIOPSY Right 11/03/2018   TEE WITHOUT CARDIOVERSION  10/29/2012   Procedure: TRANSESOPHAGEAL ECHOCARDIOGRAM (TEE);  Surgeon: Fay Records, MD;  Location[facility] ENDOSCOPY;  Service: Cardiovascular;  Laterality: N/A;  pre ablation   TEE WITHOUT CARDIOVERSION N/A 09/20/2015   Procedure: TRANSESOPHAGEAL ECHOCARDIOGRAM (TEE);  Surgeon: Thayer Headings, MD;  Location: [facility];  Service: Cardiovascular;  Laterality: N/A;   TEE WITHOUT CARDIOVERSION N/A 07/11/2021   Procedure: TRANSESOPHAGEAL ECHOCARDIOGRAM (TEE);  Surgeon: Geralynn Rile, MD;  Location: [facility];  Service: Cardiovascular;  Laterality: N/A;   TONSILLECTOMY     when child    Current Outpatient Medications  Medication Sig Dispense Refill   acyclovir (ZOVIRAX) 400 MG tablet TAKE 1 TABLET BY MOUTH THREE TIMES DAILY AS NEEDED AS DIRECTED (Patient taking differently: Take  400 mg by mouth 3 (three) times daily as needed (blisters).) 30 tablet 0   atorvastatin (LIPITOR) 40 MG tablet TAKE 1 TABLET(40 MG) BY MOUTH DAILY (Patient taking differently: Take 40 mg by mouth daily.) 90 tablet 3   diltiazem (CARDIZEM CD) 120 MG 24 hr capsule Take 1 capsule (120 mg total) by mouth daily. 30 capsule 3   diltiazem (CARDIZEM) 30 MG tablet TAKE 1 TABLET BY MOUTH E 4 HOURS AS NEEDED FOR HR GREATER THAN 100 (Patient taking differently: Take 30 mg by mouth daily as needed (afib).) 45 tablet 3   finasteride (PROSCAR) 5 MG tablet TAKE 1 TABLET(5 MG) BY MOUTH DAILY (Patient taking differently: Take 5 mg by mouth daily.) 90 tablet 3   Multiple Vitamin (MULTI VITAMIN PO) Take 1 tablet by mouth daily.      omeprazole (PRILOSEC) 40 MG capsule Take 40 mg by mouth daily  as needed (heartburn or acid reflux).     rivaroxaban (XARELTO) 20 MG TABS tablet Take 1 tablet (20 mg total) by mouth daily with supper. 30 tablet 3   dofetilide (TIKOSYN) 250 MCG capsule Take 1 capsule (250 mcg total) by mouth 2 (two) times daily. 180 capsule 1   No current facility-administered medications for this encounter.    No Known Allergies  Social History   Socioeconomic History   Marital status: Single    Spouse name: Not on file   Number of children: Not on file   Years of education: Not on file   Highest education level: Not on file  Occupational History   Occupation: owns [employer]    Employer: [name]   Occupation: bookkeeper  Tobacco Use   Smoking status: Never   Smokeless tobacco: Never  Substance and Sexual Activity   Alcohol use: Yes    Alcohol/week: 3.0 standard drinks    Types: 3 Cans of beer per week   Drug use: No   Sexual activity: Yes  Other Topics Concern   Not on file  Social History Narrative   Not on file   Social Determinants of Health   Financial Resource Strain: Not on file  Food Insecurity: Not on file  Transportation Needs: Not on file  Physical Activity:  Not on file  Stress: Not on file  Social Connections: Not on file  Intimate Partner Violence: Not on file    Family History  Problem Relation Age of Onset   Hypertension Father    Hypertension Brother    Hyperlipidemia Brother    Stroke Mother     ROS- All systems are reviewed and negative except as per the HPI above  Physical Exam: Vitals:   08/09/21 1139  BP: 134/76  Pulse: (!) 49  Weight: 82.7 kg  Height: 5\' 9"  (1.753 m)     Wt Readings from Last 3 Encounters:  08/09/21 82.7 kg  07/30/21 83 kg  07/30/21 83.2 kg    Labs: Lab Results  Component Value Date   NA 137 08/09/2021   K 3.9 08/09/2021   CL 105 08/09/2021   CO2 26 08/09/2021   GLUCOSE 79 08/09/2021   BUN 17 08/09/2021   CREATININE 0.98 08/09/2021   CALCIUM 9.2 08/09/2021   MG 2.2 08/09/2021   Lab Results  Component Value Date   INR 2.3 08/09/2009   Lab Results  Component Value Date   CHOL 183 03/07/2021   HDL 46 03/07/2021   LDLCALC 112 (H) 03/07/2021   TRIG 140 03/07/2021    GEN- The patient is a well appearing male, alert and oriented x 3 today.   HEENT-head normocephalic, atraumatic, sclera clear, conjunctiva pink, hearing intact, trachea midline. Lungs- Clear to ausculation bilaterally, normal work of breathing Heart- Regular rate and rhythm, no murmurs, rubs or gallops  GI- soft, NT, ND, + BS Extremities- no clubbing, cyanosis, or edema MS- no significant deformity or atrophy Skin- no rash or lesion Psych- euthymic mood, full affect Neuro- strength and sensation are intact   EKG-  Sinus brady Vent. Rate 49 BPM PR interval 176 ms QRS duration 82 ms QT/QTcB 472/426 ms   Assessment and Plan: 1. Paroxysmal afib/Persistent atrial flutter  H/o afib ablations x 4  Pt had Tikosyn admission last week, discharged on 250 mcg bid for long qt on 500 mcg  Qt stable at 426 ms   Dofetilide RX sent into [facility] thru [facility] No recent benadryl use PharmD  has screened medications,  propafenone discontinued.  Continue diltiazem 120 mg daily, for now, but pt will move to bedtime to see if less brady seen during the day, he will call the office if HR's are consistently in the 40's  May have to switch to low dose BB Do not want to stop CCB at this point as he has had some breakthrough afib  Continue xarelto 20 mg daily for a CHA2DS2VASc  of 2  Continue Linq monitoring   Has f/u with Maurice Kilts, PA in one month   Maurice [name], [facility] [address] [place], [place] [address] [phone]

## 2021-08-09 NOTE — Patient Instructions (Signed)
Move Cardizem to bedtime - monitor heart rates and lcall in week with update

## 2021-08-21 ENCOUNTER — Encounter (HOSPITAL_COMMUNITY): Payer: Self-pay

## 2021-08-21 ENCOUNTER — Other Ambulatory Visit: Payer: Self-pay

## 2021-08-21 ENCOUNTER — Ambulatory Visit (HOSPITAL_COMMUNITY)
Admission: RE | Admit: 2021-08-21 | Discharge: 2021-08-21 | Disposition: A | Discharge status: Home / Self Care | Payer: Medicare Other | Source: Ambulatory Visit | Attending: Nurse Practitioner | Admitting: Nurse Practitioner

## 2021-08-21 DIAGNOSIS — I4891 Unspecified atrial fibrillation: Secondary | ICD-10-CM | POA: Insufficient documentation

## 2021-08-21 DIAGNOSIS — Z01812 Encounter for preprocedural laboratory examination: Secondary | ICD-10-CM | POA: Insufficient documentation

## 2021-08-21 LAB — BASIC METABOLIC PANEL
Anion gap: 7 (ref 5–15)
BUN: 18 mg/dL (ref 8–23)
CO2: 27 mmol/L (ref 22–32)
Calcium: 9.4 mg/dL (ref 8.9–10.3)
Chloride: 107 mmol/L (ref 98–111)
Creatinine, Ser: 0.95 mg/dL (ref 0.61–1.24)
GFR, Estimated: >60 mL/min (ref >60)
Glucose, Bld: 107 mg/dL — ABNORMAL HIGH (ref 70–99)
Potassium: 4.4 mmol/L (ref 3.5–5.1)
Sodium: 141 mmol/L (ref 135–145)

## 2021-08-27 IMAGING — CT CT HEART MORPH/PULM VEIN W/ CM & W/O CA SCORE
2 of 7 series · 11 of 20 positions shown, 13 images · non-contrast
Comparison: None.
COMPARISON: None.

Addendum:
EXAM:
OVER-READ INTERPRETATION  CT CHEST

The following report is an over-read performed by radiologist Dr.
Ilzo Hanzen [REDACTED] on 03/14/2020. This
over-read does not include interpretation of cardiac or coronary
anatomy or pathology. The coronary calcium score/coronary CTA
interpretation by the cardiologist is attached.
CLINICAL DATA: Atrial fibrillation
Cardiac CTA
MEDICATIONS:
No additional meds.
TECHNIQUE: The patient was scanned on a Siemens [REDACTED]ice scanner. Gantry
rotation speed was 250 msecs. Collimation was 0.6 mm. A 100 kV
prospective scan was triggered in the ascending thoracic aorta at
35-75% of the R-R interval. Average HR during the scan was 70 bpm.
The 3D data set was interpreted on a dedicated work station using
MPR, MIP and VRT modes. A total of 80cc of contrast was used.

[Series 10: 0-95% · axial · 0.39mm/px · z∈[+796,+878]mm · 5 of 2470 slices shown]
[im 412/2470  vessel]
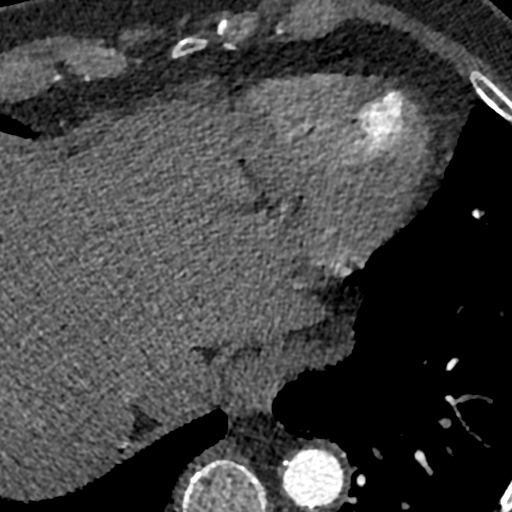
[im 824/2470  vessel]
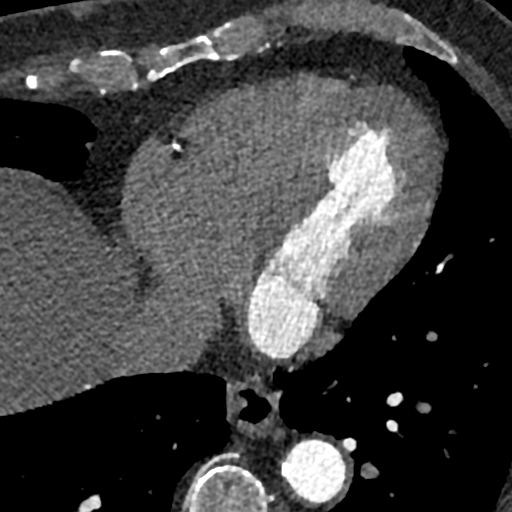
[im 1235/2470  vessel]
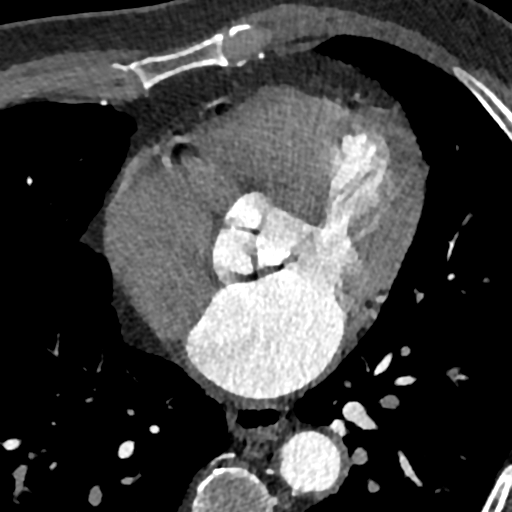
[im 1647/2470  vessel]
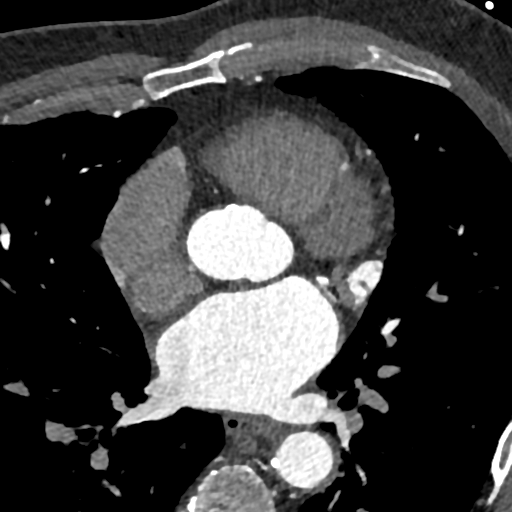
[im 2058/2470  vessel]
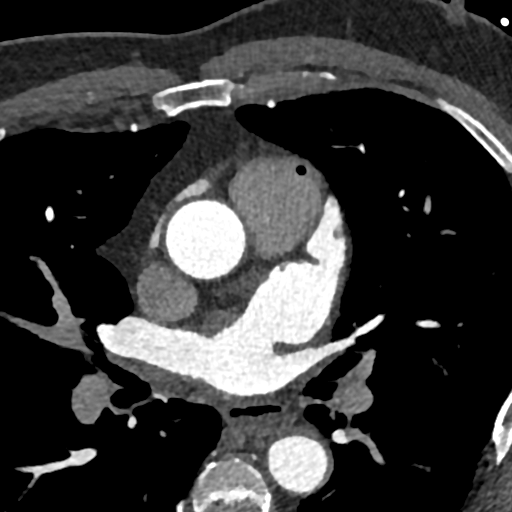

[Series 11: 5-95% · axial · 0.39mm/px · z∈[+793,+881]mm · 6 of 2470 slices shown, 8 images]
[im 353/2470  vessel]
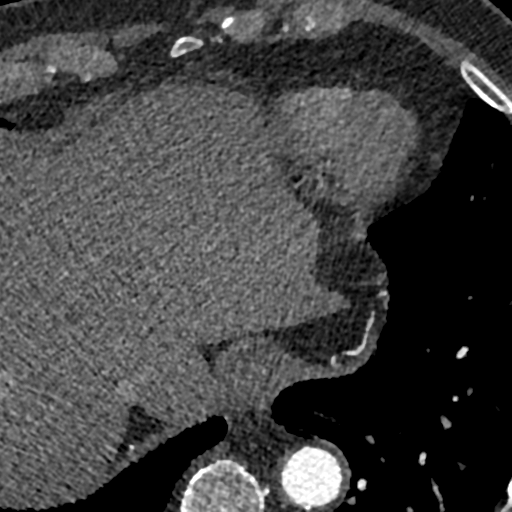
[im 353/2470  lung]
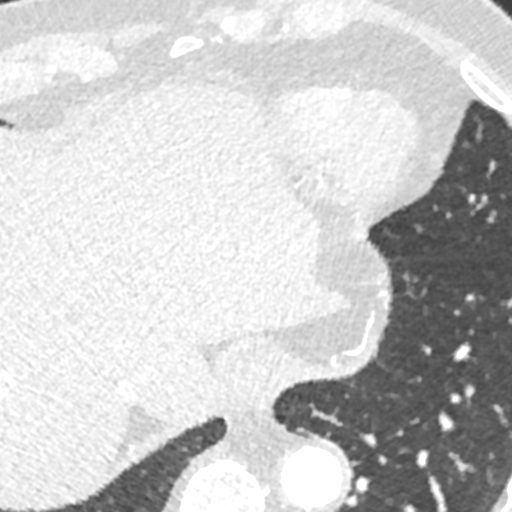
[im 706/2470  vessel]
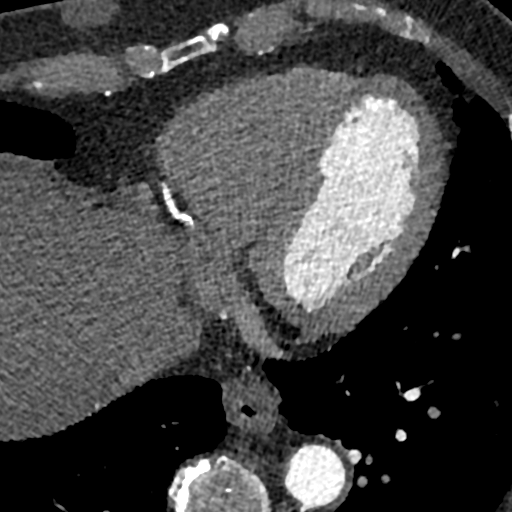
[im 1059/2470  vessel]
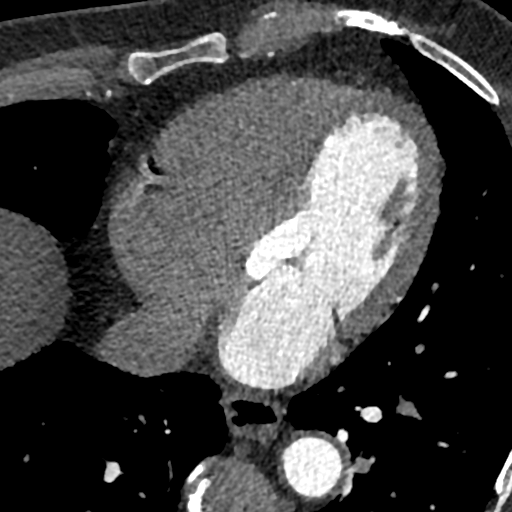
[im 1411/2470  vessel]
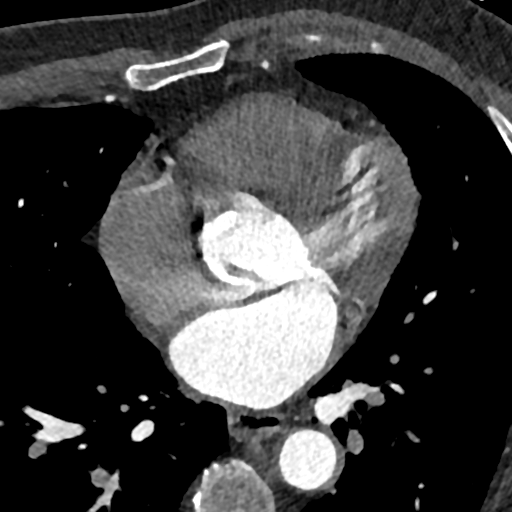
[im 1764/2470  vessel]
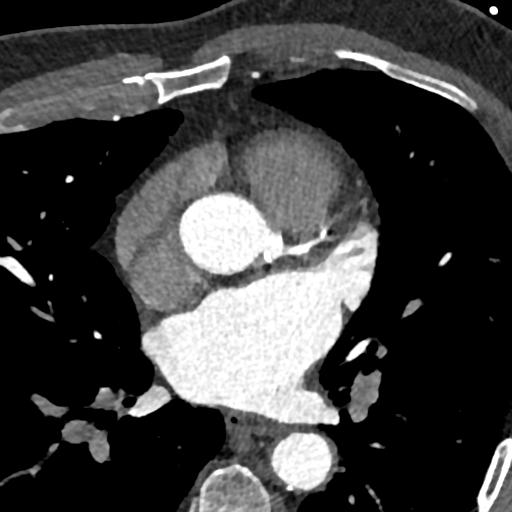
[im 1764/2470  lung]
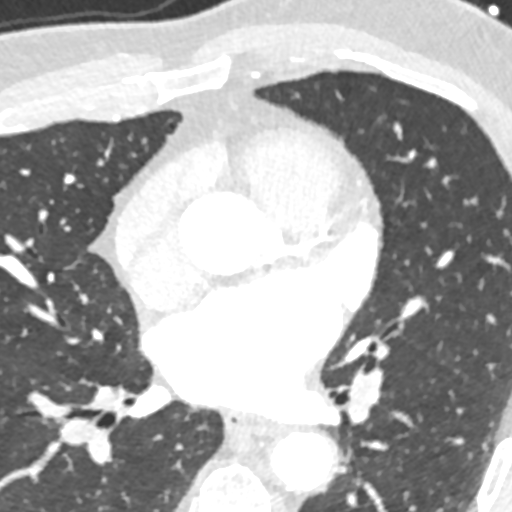
[im 2117/2470  vessel]
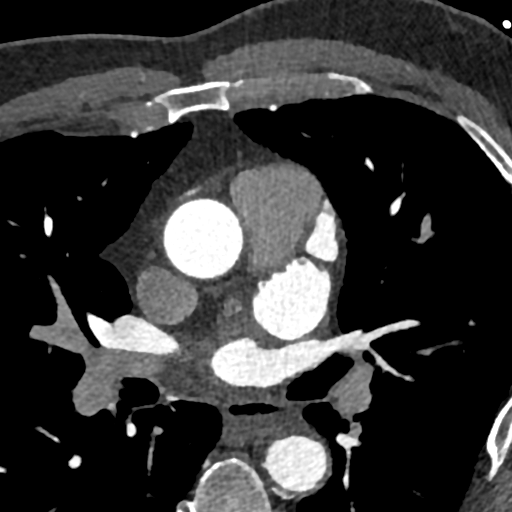

[11 of 20 positions shown; findings below may reference images not displayed]

FINDINGS: Aortic atherosclerosis. Within the visualized portions of the thorax
there are no suspicious appearing pulmonary nodules or masses, there
is no acute consolidative airspace disease, no pleural effusions, no
pneumothorax and no lymphadenopathy. Visualized portions of the
upper abdomen are unremarkable. There are no aggressive appearing
lytic or blastic lesions noted in the visualized portions of the
skeleton.
IMPRESSION: 1.  Aortic Atherosclerosis (XXPYB-HKU.U).
FINDINGS: Non-cardiac: See separate report from [REDACTED].

No LA appendage thrombus. Pulmonary veins drained normally to the
left atrium.

LUPV 21 x 10 mm

LLPV 18 x 12.5 mm

RUPV 21.5 x 17 mm

RLPV 17 x 14 mm

Calcium Score: 439 Agatston units.

Coronary Arteries: Right dominant with no anomalies

LM: Calcified plaque ostial left main, minimal stenosis.

LAD system: Mixed plaque mid LAD, mild (<50%) stenosis.

Circumflex system: Mixed plaque proximal LCx, mild (<50%) stenosis.

RCA system: Calcified plaque throughout the RCA, no more than mild
(<50%) stenosis.
IMPRESSION: 1.  No LA appendage thrombus noted.

2. Pulmonary veins drain normally to the left atrium with dimensions
as above.

3. Coronary artery calcium score 439 Agatston units. This places the
patient in the 74th percentile for age and gender, suggesting
intermediate risk for future cardiac events.

4.  Nonobstructive coronary disease.

Viorel Horan

*** End of Addendum ***
EXAM:
OVER-READ INTERPRETATION  CT CHEST

The following report is an over-read performed by radiologist Dr.
Ilzo Hanzen [REDACTED] on 03/14/2020. This
over-read does not include interpretation of cardiac or coronary
anatomy or pathology. The coronary calcium score/coronary CTA
interpretation by the cardiologist is attached.
FINDINGS: Aortic atherosclerosis. Within the visualized portions of the thorax
there are no suspicious appearing pulmonary nodules or masses, there
is no acute consolidative airspace disease, no pleural effusions, no
pneumothorax and no lymphadenopathy. Visualized portions of the
upper abdomen are unremarkable. There are no aggressive appearing
lytic or blastic lesions noted in the visualized portions of the
skeleton.
IMPRESSION: 1.  Aortic Atherosclerosis (XXPYB-HKU.U).

## 2021-09-03 ENCOUNTER — Ambulatory Visit (INDEPENDENT_AMBULATORY_CARE_PROVIDER_SITE_OTHER): Payer: Medicare Other

## 2021-09-03 DIAGNOSIS — I4819 Other persistent atrial fibrillation: Secondary | ICD-10-CM

## 2021-09-03 LAB — CUP PACEART REMOTE DEVICE CHECK
Date Time Interrogation Session: 20221121080919
Implantable Pulse Generator Implant Date: 20201228

## 2021-09-05 ENCOUNTER — Other Ambulatory Visit: Payer: Self-pay

## 2021-09-05 ENCOUNTER — Encounter: Payer: Self-pay | Admitting: Student

## 2021-09-05 ENCOUNTER — Ambulatory Visit: Payer: Medicare Other | Admitting: Student

## 2021-09-05 VITALS — BP 130/72 | HR 58 | Ht 69.0 in | Wt 183.0 lb

## 2021-09-05 DIAGNOSIS — I480 Paroxysmal atrial fibrillation: Secondary | ICD-10-CM | POA: Diagnosis not present

## 2021-09-05 DIAGNOSIS — D6869 Other thrombophilia: Secondary | ICD-10-CM

## 2021-09-05 NOTE — Patient Instructions (Addendum)
Medication Instructions:  Your physician recommends that you continue on your current medications as directed. Please refer to the Current Medication list given to you today.  *If you need a refill on your cardiac medications before your next appointment, please call your pharmacy*   Lab Work: BMET and Mg today If you have labs (blood work) drawn today and your tests are completely normal, you will receive your results only by: [place] (if you have MyChart) OR A paper copy in the mail If you have any lab test that is abnormal or we need to change your treatment, we will call you to review the results.   Testing/Procedures: None ordered.    Follow-Up: At [facility], you and your health needs are our priority.  As part of our continuing mission to provide you with exceptional heart care, we have created designated Provider Care Teams.  These Care Teams include your primary Cardiologist (physician) and Advanced Practice Providers (APPs -  Physician Assistants and Nurse Practitioners) who all work together to provide you with the care you need, when you need it.  We recommend signing up for the patient portal called "MyChart".  Sign up information is provided on this After Visit Summary.  MyChart is used to connect with patients for Virtual Visits (Telemedicine).  Patients are able to view lab/test results, encounter notes, upcoming appointments, etc.  Non-urgent messages can be sent to your provider as well.   To learn more about what you can do with MyChart, go to [email or web address].    Your next appointment:   12/10/2021 at 12 noon

## 2021-09-05 NOTE — Progress Notes (Signed)
Electrophysiology Office Note Date: 09/05/2021  ID:  Maurice [name], DOB 1950/10/23, MRN [id number]  PCP: [name], MD Primary Cardiologist: None Electrophysiologist: Thompson Grayer, MD   CC: ILR follow-up  Maurice [name] is a 70 y.o. male seen today for Dr. Rayann Heman . he presents today for routine electrophysiology followup.  Since last being seen in our clinic, the patient reports doing very well. He took 4 exercise classes last week and felt great. he denies chest pain, palpitations, dyspnea, PND, orthopnea, nausea, vomiting, dizziness, syncope, edema, weight gain, or early satiety.  Device History: Medtronic loop recorder implanted 09/2019 for atrial fibrillation monitoring s/p ablation  Past Medical History:  Diagnosis Date   Atrial fibrillation ([place])    paroxysmal s/p PVI by JA 2010, 2014, 2017   Diverticulosis    Colonoscopy 07/2013   Herpes    History of hepatitis B    Hypercholesterolemia    Varicose veins    Past Surgical History:  Procedure Laterality Date   atrail fibrillation ablation  7/10, 1//17/14   PVI by H. Rivera Colon N/A 10/29/2012   Procedure: [place];  Surgeon: Thompson Grayer, MD;  Location[facility] CATH LAB;  Service: Cardiovascular;  Laterality: N/A;   ATRIAL FIBRILLATION ABLATION N/A 03/24/2020   Procedure: ATRIAL FIBRILLATION ABLATION;  Surgeon: Thompson Grayer, MD;  Location: [place] CV LAB;  Service: Cardiovascular;  Laterality: N/A;   CARDIOVERSION N/A 09/20/2015   Procedure: CARDIOVERSION;  Surgeon: Thayer Headings, MD;  Location: [place];  Service: Cardiovascular;  Laterality: N/A;   CARDIOVERSION N/A 09/28/2015   Procedure: CARDIOVERSION;  Surgeon: Lelon Perla, MD;  Location: [facility] ENDOSCOPY;  Service: Cardiovascular;  Laterality: N/A;   CARDIOVERSION N/A 07/11/2021   Procedure: CARDIOVERSION;  Surgeon: Geralynn Rile, MD;  Location: [place];  Service: Cardiovascular;   Laterality: N/A;   COLONOSCOPY  2004   ELECTROPHYSIOLOGIC STUDY N/A 11/28/2015   3rd AF ablation by Dr Rayann Heman   implantable loop recorder placement  10/11/2019   Medtronic Reveal [place] model LNQ22 (SN [id number]) by Dr Rayann Heman for afib management post ablation   SKIN BIOPSY Right 11/03/2018   TEE WITHOUT CARDIOVERSION  10/29/2012   Procedure: TRANSESOPHAGEAL ECHOCARDIOGRAM (TEE);  Surgeon: Fay Records, MD;  Location: [facility] ENDOSCOPY;  Service: Cardiovascular;  Laterality: N/A;  pre ablation   TEE WITHOUT CARDIOVERSION N/A 09/20/2015   Procedure: TRANSESOPHAGEAL ECHOCARDIOGRAM (TEE);  Surgeon: Thayer Headings, MD;  Location: [place];  Service: Cardiovascular;  Laterality: N/A;   TEE WITHOUT CARDIOVERSION N/A 07/11/2021   Procedure: TRANSESOPHAGEAL ECHOCARDIOGRAM (TEE);  Surgeon: Geralynn Rile, MD;  Location: [place];  Service: Cardiovascular;  Laterality: N/A;   TONSILLECTOMY     when child    Current Outpatient Medications  Medication Sig Dispense Refill   acyclovir (ZOVIRAX) 400 MG tablet TAKE 1 TABLET BY MOUTH THREE TIMES DAILY AS NEEDED AS DIRECTED 30 tablet 0   atorvastatin (LIPITOR) 40 MG tablet TAKE 1 TABLET(40 MG) BY MOUTH DAILY 90 tablet 3   diltiazem (CARDIZEM CD) 120 MG 24 hr capsule Take 1 capsule (120 mg total) by mouth daily. 30 capsule 3   diltiazem (CARDIZEM) 30 MG tablet TAKE 1 TABLET BY MOUTH E 4 HOURS AS NEEDED FOR HR GREATER THAN 100 45 tablet 3   dofetilide (TIKOSYN) 250 MCG capsule Take 1 capsule (250 mcg total) by mouth 2 (two) times daily. 180 capsule 1   finasteride (PROSCAR) 5 MG tablet TAKE 1 TABLET(5  MG) BY MOUTH DAILY 90 tablet 3   Multiple Vitamin (MULTI VITAMIN PO) Take 1 tablet by mouth daily.      omeprazole (PRILOSEC) 40 MG capsule Take 40 mg by mouth daily as needed (heartburn or acid reflux).     potassium chloride (KLOR-CON) 10 MEQ tablet Take 1 tablet (10 mEq total) by mouth daily. 90 tablet 3   rivaroxaban (XARELTO) 20 MG TABS tablet Take 1  tablet (20 mg total) by mouth daily with supper. 30 tablet 3   No current facility-administered medications for this visit.    Allergies:   Patient has no known allergies.   Social History: Social History   Socioeconomic History   Marital status: Single    Spouse name: Not on file   Number of children: Not on file   Years of education: Not on file   Highest education level: Not on file  Occupational History   Occupation: owns yoga studio    Employer: [employer]   Occupation: bookkeeper  Tobacco Use   Smoking status: Never   Smokeless tobacco: Never  Substance and Sexual Activity   Alcohol use: Yes    Alcohol/week: 3.0 standard drinks    Types: 3 Cans of beer per week   Drug use: No   Sexual activity: Yes  Other Topics Concern   Not on file  Social History Narrative   Not on file   Social Determinants of Health   Financial Resource Strain: Not on file  Food Insecurity: Not on file  Transportation Needs: Not on file  Physical Activity: Not on file  Stress: Not on file  Social Connections: Not on file  Intimate Partner Violence: Not on file    Family History: Family History  Problem Relation Age of Onset   Hypertension Father    Hypertension Brother    Hyperlipidemia Brother    Stroke Mother      Review of Systems: All other systems reviewed and are otherwise negative except as noted above.  Physical Exam: Vitals:   09/05/21 1202  BP: 130/72  Pulse: (!) 58  SpO2: 95%  Weight: 183 lb (83 kg)  Height: 5\' 9"  (1.753 m)     GEN- The patient is well appearing, alert and oriented x 3 today.   HEENT: normocephalic, atraumatic; sclera clear, conjunctiva pink; hearing intact; oropharynx clear; neck supple  Lungs- Clear to ausculation bilaterally, normal work of breathing.  No wheezes, rales, rhonchi Heart- Regular rate and rhythm, no murmurs, rubs or gallops  GI- soft, non-tender, non-distended, bowel sounds present  Extremities- no clubbing,  cyanosis, or edema  MS- no significant deformity or atrophy Skin- warm and dry, no rash or lesion; PPM pocket well healed Psych- euthymic mood, full affect Neuro- strength and sensation are intact  PPM Interrogation- reviewed in detail today,  See PACEART report  EKG:  EKG is ordered today. The ekg ordered today shows sinus bradycardia at 58 bpm with stable QTc  Recent Labs: 07/10/2021: ALT 25; Hemoglobin 15.9; Platelets 205; TSH 2.302 08/09/2021: Magnesium 2.2 08/21/2021: BUN 18; Creatinine, Ser 0.95; Potassium 4.4; Sodium 141   Wt Readings from Last 3 Encounters:  09/05/21 183 lb (83 kg)  08/09/21 182 lb 6.4 oz (82.7 kg)  07/30/21 182 lb 14.4 oz (83 kg)     Other studies Reviewed: Additional studies/ records that were reviewed today include: Previous remote checks, Most recent labwork.   Assessment and Plan:  1.  Paroxysmal atrial fibrillation  s/p Medtronic Loop recorder Normal device  function See Claudia Desanctis Art report No changes today EKG today shows NSR/sinus brady on tikosyn 250 mcg BID.  AF burden 8% by remote 09/03/2021. No episodes since 08/26/2021 by personal review of carelink.  Current medicines are reviewed at length with the patient today.    Labs/ tests ordered today include:  Orders Placed This Encounter  Procedures   Basic metabolic panel   Magnesium   EKG 12-Lead   Disposition:   Follow up with EP APP in 3 Months    Signed, [name], PA-C  09/05/2021 12:11 PM  [address] [place] [address] [phone] (office) [phone] (fax)

## 2021-09-06 LAB — SPECIMEN STATUS

## 2021-09-10 LAB — MAGNESIUM: Magnesium: 2.2 mg/dL (ref 1.6–2.3)

## 2021-09-10 LAB — BASIC METABOLIC PANEL
BUN/Creatinine Ratio: 21 (ref 10–24)
BUN: 19 mg/dL (ref 8–27)
CO2: 23 mmol/L (ref 20–29)
Calcium: 9.2 mg/dL (ref 8.6–10.2)
Chloride: 104 mmol/L (ref 96–106)
Creatinine, Ser: 0.9 mg/dL (ref 0.76–1.27)
Glucose: 97 mg/dL (ref 70–99)
Potassium: 4.2 mmol/L (ref 3.5–5.2)
Sodium: 140 mmol/L (ref 134–144)
eGFR: 92 mL/min/{1.73_m2} (ref >59)

## 2021-09-10 LAB — SPECIMEN STATUS REPORT

## 2021-09-11 NOTE — Progress Notes (Signed)
Carelink Summary Report / Loop Recorder 

## 2021-10-02 LAB — CUP PACEART REMOTE DEVICE CHECK
Date Time Interrogation Session: 20221216131028
Implantable Pulse Generator Implant Date: 20201228

## 2021-10-03 ENCOUNTER — Ambulatory Visit (INDEPENDENT_AMBULATORY_CARE_PROVIDER_SITE_OTHER): Payer: Medicare Other

## 2021-10-03 DIAGNOSIS — I480 Paroxysmal atrial fibrillation: Secondary | ICD-10-CM

## 2021-10-12 NOTE — Progress Notes (Signed)
Carelink Summary Report / Loop Recorder 

## 2021-10-22 ENCOUNTER — Other Ambulatory Visit: Payer: Self-pay | Admitting: Family Medicine

## 2021-10-22 ENCOUNTER — Other Ambulatory Visit (HOSPITAL_COMMUNITY): Payer: Self-pay | Admitting: Physician Assistant

## 2021-10-22 DIAGNOSIS — Z8619 Personal history of other infectious and parasitic diseases: Secondary | ICD-10-CM

## 2021-10-22 NOTE — Telephone Encounter (Signed)
Pt last saw Oda Kilts, [place] on 09/05/21, last labs 09/05/21 Creat 0.90, age 71, weight 83kg, CrCl 89.66, based on CrCl pt is on appropriate dosage of Xarelto 20mg  QD.  Will refill rx.

## 2021-10-22 NOTE — Telephone Encounter (Signed)
[facility] is requesting to fill pt acyclovir. Please advise KH 

## 2021-10-25 ENCOUNTER — Ambulatory Visit: Payer: Medicare Other | Admitting: Physician Assistant

## 2021-10-25 ENCOUNTER — Encounter: Payer: Self-pay | Admitting: Physician Assistant

## 2021-10-25 ENCOUNTER — Other Ambulatory Visit: Payer: Self-pay

## 2021-10-25 DIAGNOSIS — L820 Inflamed seborrheic keratosis: Secondary | ICD-10-CM

## 2021-10-25 DIAGNOSIS — Z85828 Personal history of other malignant neoplasm of skin: Secondary | ICD-10-CM | POA: Diagnosis not present

## 2021-10-25 NOTE — Progress Notes (Signed)
° °  Follow-Up Visit   Subjective  Maurice [name] is a 71 y.o. male who presents for the following: Actinic Keratosis (Pt her to f/u on Aks. Pt states there is no discomfort. Pt has personal hx of skin cancer).   The following portions of the chart were reviewed this encounter and updated as appropriate:  Tobacco   Allergies   Meds   Problems   Med Hx   Surg Hx   Fam Hx       Objective  Well appearing patient in no apparent distress; mood and affect are within normal limits.  A full examination was performed including scalp, head, eyes, ears, nose, lips, neck, chest, axillae, abdomen, back, buttocks, bilateral upper extremities, bilateral lower extremities, hands, feet, fingers, toes, fingernails, and toenails. All findings within normal limits unless otherwise noted below.  Total body (17) Brown crusts on erythematous base.   Assessment & Plan  Inflamed seborrheic keratosis (17) Total body  Destruction of lesion - Total body Complexity: simple   Destruction method: cryotherapy   Informed consent: discussed and consent obtained   Timeout:  patient name, date of birth, surgical site, and procedure verified Lesion destroyed using liquid nitrogen: Yes   Cryotherapy cycles:  1 Outcome: patient tolerated procedure well with no complications   Post-procedure details: wound care instructions given     No atypical nevi noted at the time of the visit. I, Ahmere Hemenway, PA-C, have reviewed all documentation's for this visit.  The documentation on 10/25/21 for the exam, diagnosis, procedures and orders are all accurate and complete.

## 2021-11-05 ENCOUNTER — Ambulatory Visit (INDEPENDENT_AMBULATORY_CARE_PROVIDER_SITE_OTHER): Payer: Medicare Other

## 2021-11-05 DIAGNOSIS — I480 Paroxysmal atrial fibrillation: Secondary | ICD-10-CM | POA: Diagnosis not present

## 2021-11-05 LAB — CUP PACEART REMOTE DEVICE CHECK
Date Time Interrogation Session: 20230123112906
Implantable Pulse Generator Implant Date: 20201228

## 2021-11-15 NOTE — Progress Notes (Signed)
Carelink Summary Report / Loop Recorder 

## 2021-11-16 DIAGNOSIS — R3915 Urgency of urination: Secondary | ICD-10-CM | POA: Diagnosis not present

## 2021-11-16 DIAGNOSIS — R3912 Poor urinary stream: Secondary | ICD-10-CM | POA: Diagnosis not present

## 2021-11-16 DIAGNOSIS — N401 Enlarged prostate with lower urinary tract symptoms: Secondary | ICD-10-CM | POA: Diagnosis not present

## 2021-12-06 NOTE — Progress Notes (Signed)
Electrophysiology Office Note Date: 12/06/2021  ID:  Lamere, Lightner 1950-11-19, MRN [id number]  PCP: [name], MD Primary Cardiologist: None Electrophysiologist: Thompson Grayer, MD   CC: ILR follow-up  Maurice [name] is a 71 y.o. male seen today for Dr. Rayann Heman . he presents today for routine electrophysiology followup.  Since last being seen in our clinic, the patient reports doing very well.  he denies chest pain, palpitations, dyspnea, PND, orthopnea, nausea, vomiting, dizziness, syncope, edema, weight gain, or early satiety.  Device History: Medtronic loop recorder implanted 09/2019 for atrial fibrillation monitoring s/p ablation  Past Medical History:  Diagnosis Date   Atrial fibrillation ([place])    paroxysmal s/p PVI by JA 2010, 2014, 2017   Atypical mole 06/22/2013   mild - r chest   Atypical mole 08/23/2014   moderate - l paraspinal   Atypical mole 03/26/2016   mild- R lower back (recheck evert 75mts)   Basal cell carcinoma of skin 07/16/2018   Nod-R Nare (MOHS)   Diverticulosis    Colonoscopy 07/2013   Herpes    History of hepatitis B    Hypercholesterolemia    Squamous cell carcinoma of skin of right thumb 08/28/2014   insitu - R Thumb - currett, cautery   Varicose veins    Past Surgical History:  Procedure Laterality Date   atrail fibrillation ablation  7/10, 1//17/14   PVI by [place] N/A 10/29/2012   Procedure: [place];  Surgeon: Thompson Grayer, MD;  Location: [facility] CATH LAB;  Service: Cardiovascular;  Laterality: N/A;   ATRIAL FIBRILLATION ABLATION N/A 03/24/2020   Procedure: ATRIAL FIBRILLATION ABLATION;  Surgeon: Thompson Grayer, MD;  Location: [place] CV LAB;  Service: Cardiovascular;  Laterality: N/A;   CARDIOVERSION N/A 09/20/2015   Procedure: CARDIOVERSION;  Surgeon: Thayer Headings, MD;  Location: [place];  Service: Cardiovascular;  Laterality: N/A;   CARDIOVERSION N/A 09/28/2015    Procedure: CARDIOVERSION;  Surgeon: Lelon Perla, MD;  Location: [facility] ENDOSCOPY;  Service: Cardiovascular;  Laterality: N/A;   CARDIOVERSION N/A 07/11/2021   Procedure: CARDIOVERSION;  Surgeon: Geralynn Rile, MD;  Location: [place];  Service: Cardiovascular;  Laterality: N/A;   COLONOSCOPY  2004   ELECTROPHYSIOLOGIC STUDY N/A 11/28/2015   3rd AF ablation by Dr Rayann Heman   implantable loop recorder placement  10/11/2019   Medtronic Reveal [place] model LNQ22 (SN RLB [id number]) by Dr Rayann Heman for afib management post ablation   SKIN BIOPSY Right 11/03/2018   TEE WITHOUT CARDIOVERSION  10/29/2012   Procedure: TRANSESOPHAGEAL ECHOCARDIOGRAM (TEE);  Surgeon: Fay Records, MD;  Location: [facility] ENDOSCOPY;  Service: Cardiovascular;  Laterality: N/A;  pre ablation   TEE WITHOUT CARDIOVERSION N/A 09/20/2015   Procedure: TRANSESOPHAGEAL ECHOCARDIOGRAM (TEE);  Surgeon: Thayer Headings, MD;  Location: [place];  Service: Cardiovascular;  Laterality: N/A;   TEE WITHOUT CARDIOVERSION N/A 07/11/2021   Procedure: TRANSESOPHAGEAL ECHOCARDIOGRAM (TEE);  Surgeon: Geralynn Rile, MD;  Location: [place];  Service: Cardiovascular;  Laterality: N/A;   TONSILLECTOMY     when child    Current Outpatient Medications  Medication Sig Dispense Refill   acyclovir (ZOVIRAX) 400 MG tablet TAKE 1 TABLET BY MOUTH THREE TIMES DAILY AS NEEDED AS DIRECTED 30 tablet 0   atorvastatin (LIPITOR) 40 MG tablet TAKE 1 TABLET(40 MG) BY MOUTH DAILY 90 tablet 3   diltiazem (CARDIZEM CD) 120 MG 24 hr capsule TAKE 1 CAPSULE(120 MG) BY MOUTH DAILY 90 capsule  3   diltiazem (CARDIZEM) 30 MG tablet TAKE 1 TABLET BY MOUTH E 4 HOURS AS NEEDED FOR HR GREATER THAN 100 45 tablet 3   dofetilide (TIKOSYN) 250 MCG capsule Take 1 capsule (250 mcg total) by mouth 2 (two) times daily. 180 capsule 1   finasteride (PROSCAR) 5 MG tablet TAKE 1 TABLET(5 MG) BY MOUTH DAILY 90 tablet 3   Multiple Vitamin (MULTI VITAMIN PO) Take 1 tablet by mouth  daily.      omeprazole (PRILOSEC) 40 MG capsule Take 40 mg by mouth daily as needed (heartburn or acid reflux).     potassium chloride (KLOR-CON) 10 MEQ tablet Take 1 tablet (10 mEq total) by mouth daily. 90 tablet 3   rivaroxaban (XARELTO) 20 MG TABS tablet Take 1 tablet (20 mg total) by mouth daily with supper. 30 tablet 6   No current facility-administered medications for this visit.    Allergies:   Patient has no known allergies.   Social History: Social History   Socioeconomic History   Marital status: Single    Spouse name: Not on file   Number of children: Not on file   Years of education: Not on file   Highest education level: Not on file  Occupational History   Occupation: owns yoga studio    Employer: [employer]   Occupation: bookkeeper  Tobacco Use   Smoking status: Never   Smokeless tobacco: Never  Substance and Sexual Activity   Alcohol use: Yes    Alcohol/week: 3.0 standard drinks    Types: 3 Cans of beer per week   Drug use: No   Sexual activity: Yes  Other Topics Concern   Not on file  Social History Narrative   Not on file   Social Determinants of Health   Financial Resource Strain: Not on file  Food Insecurity: Not on file  Transportation Needs: Not on file  Physical Activity: Not on file  Stress: Not on file  Social Connections: Not on file  Intimate Partner Violence: Not on file    Family History: Family History  Problem Relation Age of Onset   Hypertension Father    Hypertension Brother    Hyperlipidemia Brother    Stroke Mother      Review of Systems: All other systems reviewed and are otherwise negative except as noted above.  Physical Exam: There were no vitals filed for this visit.   GEN- The patient is well appearing, alert and oriented x 3 today.   HEENT: normocephalic, atraumatic; sclera clear, conjunctiva pink; hearing intact; oropharynx clear; neck supple  Lungs- Clear to ausculation bilaterally, normal work of  breathing.  No wheezes, rales, rhonchi Heart- Regular rate and rhythm, no murmurs, rubs or gallops  GI- soft, non-tender, non-distended, bowel sounds present  Extremities- no clubbing, cyanosis, or edema  MS- no significant deformity or atrophy Skin- warm and dry, no rash or lesion; PPM pocket well healed Psych- euthymic mood, full affect Neuro- strength and sensation are intact  PPM Interrogation- reviewed in detail today,  See PACEART report  EKG:  EKG is ordered today. The ekg ordered today shows NSR at 85 with stable QT on tikosyn  Recent Labs: 07/10/2021: ALT 25; TSH 2.302 09/05/2021: BUN 19; Creatinine, Ser 0.90; Hemoglobin WILL FOLLOW; Magnesium 2.2; Platelets WILL FOLLOW; Potassium 4.2; Sodium 140   Wt Readings from Last 3 Encounters:  09/05/21 183 lb (83 kg)  08/09/21 182 lb 6.4 oz (82.7 kg)  07/30/21 182 lb 14.4 oz (83 kg)  Other studies Reviewed: Additional studies/ records that were reviewed today include: Previous EP office notes, Previous remote checks, Most recent labwork.   Assessment and Plan:  1.  Paroxysmal atrial fibrillation  s/p Medtronic Loop recorder Normal device function See [facility] Art report No changes today EKG today shows NSR on tikosyn 250 mcg BID AF burden 1%   Current medicines are reviewed at length with the patient today.    Labs/ tests ordered today include:  Orders Placed This Encounter  Procedures   Basic metabolic panel   Magnesium   EKG 12-Lead     Disposition:   Follow up with EP APP in 6 Months    Signed, [name]  12/06/2021 3:31 PM  [address] [place] [address] [phone] (office) [phone] (fax)

## 2021-12-10 ENCOUNTER — Ambulatory Visit (INDEPENDENT_AMBULATORY_CARE_PROVIDER_SITE_OTHER): Payer: Medicare Other

## 2021-12-10 ENCOUNTER — Ambulatory Visit (INDEPENDENT_AMBULATORY_CARE_PROVIDER_SITE_OTHER): Payer: Medicare Other | Admitting: Student

## 2021-12-10 ENCOUNTER — Encounter: Payer: Self-pay | Admitting: Student

## 2021-12-10 ENCOUNTER — Other Ambulatory Visit: Payer: Self-pay

## 2021-12-10 VITALS — BP 136/82 | HR 85 | Ht 69.0 in | Wt 184.0 lb

## 2021-12-10 DIAGNOSIS — I4819 Other persistent atrial fibrillation: Secondary | ICD-10-CM | POA: Diagnosis not present

## 2021-12-10 DIAGNOSIS — I480 Paroxysmal atrial fibrillation: Secondary | ICD-10-CM

## 2021-12-10 LAB — MAGNESIUM: Magnesium: 2.3 mg/dL (ref 1.6–2.3)

## 2021-12-10 LAB — BASIC METABOLIC PANEL
BUN/Creatinine Ratio: 14 (ref 10–24)
BUN: 15 mg/dL (ref 8–27)
CO2: 25 mmol/L (ref 20–29)
Calcium: 9.2 mg/dL (ref 8.6–10.2)
Chloride: 105 mmol/L (ref 96–106)
Creatinine, Ser: 1.04 mg/dL (ref 0.76–1.27)
Glucose: 86 mg/dL (ref 70–99)
Potassium: 4.2 mmol/L (ref 3.5–5.2)
Sodium: 141 mmol/L (ref 134–144)
eGFR: 77 mL/min/{1.73_m2} (ref >59)

## 2021-12-10 NOTE — Patient Instructions (Signed)
Medication Instructions:  Your physician recommends that you continue on your current medications as directed. Please refer to the Current Medication list given to you today.  *If you need a refill on your cardiac medications before your next appointment, please call your pharmacy*   Lab Work: TODAY: BMET, Mag  If you have labs (blood work) drawn today and your tests are completely normal, you will receive your results only by: [place] (if you have MyChart) OR A paper copy in the mail If you have any lab test that is abnormal or we need to change your treatment, we will call you to review the results.   Follow-Up: At [facility], you and your health needs are our priority.  As part of our continuing mission to provide you with exceptional heart care, we have created designated Provider Care Teams.  These Care Teams include your primary Cardiologist (physician) and Advanced Practice Providers (APPs -  Physician Assistants and Nurse Practitioners) who all work together to provide you with the care you need, when you need it.   Your next appointment:   6 month(s)  The format for your next appointment:   In Person  Provider:   Legrand Como "Oda Kilts, PA-C

## 2021-12-11 LAB — CUP PACEART REMOTE DEVICE CHECK
Date Time Interrogation Session: 20230228091332
Implantable Pulse Generator Implant Date: 20201228

## 2021-12-13 NOTE — Progress Notes (Signed)
Carelink Summary Report / Loop Recorder 

## 2021-12-17 DIAGNOSIS — H5203 Hypermetropia, bilateral: Secondary | ICD-10-CM | POA: Diagnosis not present

## 2021-12-20 DIAGNOSIS — M79645 Pain in left finger(s): Secondary | ICD-10-CM | POA: Diagnosis not present

## 2021-12-20 DIAGNOSIS — M1812 Unilateral primary osteoarthritis of first carpometacarpal joint, left hand: Secondary | ICD-10-CM | POA: Diagnosis not present

## 2021-12-21 DIAGNOSIS — N401 Enlarged prostate with lower urinary tract symptoms: Secondary | ICD-10-CM | POA: Diagnosis not present

## 2021-12-21 DIAGNOSIS — R3912 Poor urinary stream: Secondary | ICD-10-CM | POA: Diagnosis not present

## 2021-12-21 DIAGNOSIS — R3915 Urgency of urination: Secondary | ICD-10-CM | POA: Diagnosis not present

## 2021-12-25 ENCOUNTER — Telehealth: Payer: Self-pay | Admitting: Family Medicine

## 2021-12-25 ENCOUNTER — Telehealth: Payer: Self-pay | Admitting: Pharmacist

## 2021-12-25 NOTE — Chronic Care Management (AMB) (Signed)
?  Chronic Care Management  ? ?Note ? ?12/25/2021 ?Name: Maurice [name] MRN: [id number] DOB: Jun 07, 1951 ? ?Maurice [name] is a 71 y.o. year old male who is a primary care patient of [name], MD. I reached out to Gillis Ends by phone today in response to a referral sent by Maurice [name]'s PCP, [name], MD.  ? ?Mr. Forget was given information about Chronic Care Management services today including:  ?CCM service includes personalized support from designated clinical staff supervised by his physician, including individualized plan of care and coordination with other care providers ?24/7 contact phone numbers for assistance for urgent and routine care needs. ?Service will only be billed when office clinical staff spend 20 minutes or more in a month to coordinate care. ?Only one practitioner may furnish and bill the service in a calendar month. ?The patient may stop CCM services at any time (effective at the end of the month) by phone call to the office staff. ? ? ?Patient agreed to services and verbal consent obtained.  ? ?Follow up plan: ? ? ?Tatjana Dellinger ?Upstream Scheduler  ?

## 2021-12-25 NOTE — Chronic Care Management (AMB) (Signed)
? ? ?Chronic Care Management ?Pharmacy Assistant  ? ?Name: Maurice [name]  MRN: [id number] DOB: 09-Sep-1951 ? ?Reason for Encounter: Chart prep for initial encounter with Maurice [name] Clinical Pharmacist on 12/27/21 at  11 am via phone call ?  ?Conditions to be addressed/monitored: ?Atrial Fibrillation, HTN, HLD, and Gastroesophageal reflux disease without esophagitis ? ?Recent office visits:  ?03/15/21 Maurice Lung, MD - Patient presented via video for Paroxysmal atrial fibrillation and other concerns. No medication changes. ? ?5/25/2 Maurice Lung, MD - Patient presented for routine general medical exam at a health care facility and other concerns. Plan to start Atorvastatin. ? ?Recent consult visits:  ?12/10/21 Maurice Friar, PA-C (Cardiology) - Patient presented for Persistent A-fib. No medication changes. ? ?11/05/21 Maurice Curia, RN (Cardiology) - Remote Device check.  No medication changes. ? ?10/25/21 Maurice [name] (Dermatology) - Patient presented for Inflamed seborrheic keratosis. No medication changes. ? ?09/05/21 Maurice [name] (Cardiology) - Patient presented for Secondary Hypercoagulable state and other concerns. No medication changes. ? ?08/09/21 Maurice Needs, NP (Cardiology) - Patient presented for persistent atrial fibrillation and other concerns. No medication changes. ? ?07/25/21 Maurice Needs, NP (Cardiology) - Patient presented for Atrial flutter unspecified type and other concerns. No medication changes. ? ?07/19/21 Maurice Needs, NP (Cardiology) - Patient presented for Atrial flutter unspecified type and other concerns. No medication changes. ? ?07/17/21 Maurice Needs, NP (Cardiology) - Patient presented for Atrial flutter unspecified type and other concerns. No medication changes. ? ?07/10/21 Maurice Needs, NP (Cardiology) - Patient presented for Secondary hypercoagulable state and other concerns. No medication changes. ? ?07/02/21  Maurice Barre, PA (Cardiology) - Patient presented for Paroxysmal atrial fibrillation. No medication changes. ? ?Hospital visits:  ?Medication Reconciliation was completed by comparing discharge summary, patient?s EMR and Pharmacy list, and upon discussion with patient. ? ?Patient presented to [facility] on 07/30/21 due to Persistent A-Fib. Patient was present for 3 days ? ?New?Medications Started at [facility] Discharge:?? ?-started  ?dofetilide (TIKOSYN) ? ?Medication Changes at Hospital Discharge: ?-Changed  ?none ? ?Medications Discontinued at Hospital Discharge: ?-Stopped  ?none ? ?Medications that remain the same after Hospital Discharge:??  ?-All other medications will remain the same.   ? ?Patient presented to [facility] on 07/11/21 for Transesophageal Echocardiogram (TEE) . Patient was present for 3 hours ? ?New?Medications Started at [facility] Discharge:?? ?-started  ?none ? ?Medication Changes at Hospital Discharge: ?-Changed  ?none ? ?Medications Discontinued at Hospital Discharge: ?-Stopped  ?none ? ?Medications that remain the same after Hospital Discharge:??  ?-All other medications will remain the same.   ? ? ? ? ? ?Medications: ?Outpatient Encounter Medications as of 12/25/2021  ?Medication Sig  ? acyclovir (ZOVIRAX) 400 MG tablet TAKE 1 TABLET BY MOUTH THREE TIMES DAILY AS NEEDED AS DIRECTED  ? atorvastatin (LIPITOR) 40 MG tablet TAKE 1 TABLET(40 MG) BY MOUTH DAILY  ? diltiazem (CARDIZEM CD) 120 MG 24 hr capsule TAKE 1 CAPSULE(120 MG) BY MOUTH DAILY  ? diltiazem (CARDIZEM) 30 MG tablet TAKE 1 TABLET BY MOUTH E 4 HOURS AS NEEDED FOR HR GREATER THAN 100  ? dofetilide (TIKOSYN) 250 MCG capsule Take 1 capsule (250 mcg total) by mouth 2 (two) times daily.  ? finasteride (PROSCAR) 5 MG tablet TAKE 1 TABLET(5 MG) BY MOUTH DAILY  ? Multiple Vitamin (MULTI VITAMIN PO) Take 1 tablet by mouth daily.   ? omeprazole (PRILOSEC) 40 MG capsule Take 40  mg by mouth daily as needed  (heartburn or acid reflux).  ? potassium chloride (KLOR-CON) 10 MEQ tablet Take 1 tablet (10 mEq total) by mouth daily.  ? rivaroxaban (XARELTO) 20 MG TABS tablet Take 1 tablet (20 mg total) by mouth daily with supper.  ? silodosin (RAPAFLO) 8 MG CAPS capsule Take 8 mg by mouth daily.  ? ?No facility-administered encounter medications on file as of 12/25/2021.  ?Fill History  : ?ACYCLOVIR '400MG'$  TABLETS 10/22/2021 10  ? ?ATORVASTATIN '40MG'$  TABLETS 12/14/2021 90  ? ?DILTIAZEM CD '120MG'$  CAPSULES (24 HR) 10/23/2021 90  ? ?DILTIAZEM '30MG'$  TABLETS 01/31/2021 8  ? ?DOFETILIDE 250 MCG CAP 11/27/2021   ? ?FINASTERIDE '5MG'$  TABLETS 12/14/2021 90  ? ?XARELTO '20MG'$  TABLETS 12/23/2021 30  ? ?SILODOSIN '8MG'$  CAPSULES 12/13/2021 30  ? ? ?Have you seen any other providers since your last visit? Patient reports no ? ?Any changes in your medications or health?  Patient reports no ? ?Any side effects from any medications? Patient reports he has some lightheadedness at times. ? ?Do you have an symptoms or problems not managed by your medications? Patient reports none ? ?Any concerns about your health right now? Patient reports no ? ?Has your provider asked that you check blood pressure, blood sugar, or follow special diet at home? Patient reports he has an I watch and an automated machine but is not checking at all. He reports he only has abnormal readings when he is in A-fib ? ?Do you get any type of exercise on a regular basis? Patient reports he participates in spin class 3-4 times a week ? ?Can you think of a goal you would like to reach for your health? Patient reports none ? ?Do you have any problems getting your medications? Patient reports he uses [facility] and [facility] for his medications that are higher and they are typically lower there. He reports no issues with obtaining his medications on time. ? ?Is there anything that you would like to discuss during the appointment? Patient reports none ? ? ?Care Gaps: ?BP- 136/8 (  12/10/21) ?AWV- 6/22 ? ?Star Rating Drugs: ?Atorvastatin 40 mg - Last filled 12/14/21 90 DS at [facility] ? ? ? ?Ned Clines CMA ?Clinical Pharmacist Assistant ?[phone] ? ?

## 2021-12-27 ENCOUNTER — Ambulatory Visit (INDEPENDENT_AMBULATORY_CARE_PROVIDER_SITE_OTHER): Payer: Medicare Other | Admitting: Pharmacist

## 2021-12-27 NOTE — Patient Instructions (Addendum)
Hi Maurice [name], ? ?It was great to get to meet you over the telephone! Below is a summary of some of the topics we discussed.  ? ?As we discussed, please check your blood pressure at home just to keep an eye on it and make sure your medications are working properly. ? ?Please reach out to me if you have any questions or need anything! ? ?Best, ?Maddie ? ?[name], PharmD, BCACP ?Clinical Pharmacist ?[place] ?[phone] ? ?Visit Information ? ? Goals Addressed   ? ?  ?  ?  ?  ? This Visit's Progress  ?  Track and Manage My Blood Pressure-Hypertension     ?  Timeframe:  Long-Range Goal ?Priority:  Medium ?Start Date:                             ?Expected End Date:                      ? ?Follow Up Date 06/29/22  ?  ?- check blood pressure weekly ?- choose a place to take my blood pressure (home, clinic or office, retail store) ?- write blood pressure results in a log or diary  ?  ?Why is this important?   ?You won't feel high blood pressure, but it can still hurt your blood vessels.  ?High blood pressure can cause heart or kidney problems. It can also cause a stroke.  ?Making lifestyle changes like losing a little weight or eating less salt will help.  ?Checking your blood pressure at home and at different times of the day can help to control blood pressure.  ?If the doctor prescribes medicine remember to take it the way the doctor ordered.  ?Call the office if you cannot afford the medicine or if there are questions about it.   ?  ?Notes:  ?  ? ?  ? ?Patient Care Plan: [place]  ?  ? ?Problem Identified: Problem: Hypertension, Hyperlipidemia, Atrial Fibrillation, GERD, and BPH   ?  ? ?Long-Range Goal: Patient-Specific Goal   ?Start Date: 12/27/2021  ?Expected End Date: 12/27/2021  ?This Visit's Progress: On track  ?Priority: High  ?Note:   ?Current Barriers:  ?Unable to independently monitor therapeutic efficacy ?Unable to maintain control of cholesterol ? ?Pharmacist Clinical Goal(s):   ?Patient will achieve adherence to monitoring guidelines and medication adherence to achieve therapeutic efficacy ?maintain control of cholesterol as evidenced by next lipid panel  through collaboration with PharmD and provider.  ? ?Interventions: ?1:1 collaboration with [name], MD regarding development and update of comprehensive plan of care as evidenced by provider attestation and co-signature ?Inter-disciplinary care team collaboration (see longitudinal plan of care) ?Comprehensive medication review performed; medication list updated in electronic medical record ? ?Hypertension (BP goal <130/80) ?-Not ideally controlled ?-Current treatment: ?Diltiazem 120 mg 1 capsule daily  - Appropriate, Query effective, Safe, Accessible ?-Medications previously tried: unknown  ?-Current home readings: doesn't really check it (arm cuff)  ?-Current dietary habits: doesn't use the salt shaker, eating out often, not using much processed foods  ?-Current exercise habits: active with yoga almost daily and spin classes ?-Denies hypotensive/hypertensive symptoms ?-Educated on BP goals and benefits of medications for prevention of heart attack, stroke and kidney damage; ?Importance of home blood pressure monitoring; ?Proper BP monitoring technique; ?Symptoms of hypotension and importance of maintaining adequate hydration; ?-Counseled to monitor BP at home weekly, document, and provide  log at future appointments ?-Counseled on diet and exercise extensively ?Recommended to continue current medication ? ?Hyperlipidemia: (LDL goal < 100) ?-Uncontrolled ?-Current treatment: ?Atorvastatin 40 mg 1 tablet daily - Appropriate, Query effective, Safe, Accessible ?-Medications previously tried: none  ?-Current dietary patterns: occasionally fried foods; cooks with olive oil or canola ?-Current exercise habits: very active with yoga and spin classes ?-Educated on Cholesterol goals;  ?Benefits of statin for ASCVD risk  reduction; ?Importance of limiting foods high in cholesterol; ?-Counseled on diet and exercise extensively ?Recommended to continue current medication ?Recommended repeat lipid panel and consider increasing to 80 mg if LDL not < 100. ? ?Atrial Fibrillation (Goal: prevent stroke and major bleeding) ?-Controlled ?-CHADSVASC: 2 ?-Current treatment: ?Rate/rhythm control: diltiazem 120 mg 1 capsule daily; diltiazem 30 mg 1 tablet as needed for HR > 100; dofetilide 250 mcg 1 capsule twice daily - Appropriate, Effective, Safe, Accessible ?Anticoagulation: Xarelto 20 mg 1 tablet daily with supper - Appropriate, Effective, Safe, Accessible ?-Medications previously tried: none ?-Home BP and HR readings: 60s; not checking BP routinely  ?-Counseled on importance of adherence to anticoagulant exactly as prescribed; ?bleeding risk associated with Xarelto and importance of self-monitoring for signs/symptoms of bleeding; ?avoidance of NSAIDs due to increased bleeding risk with anticoagulants; ?seeking medical attention after a head injury or if there is blood in the urine/stool; ?-Counseled on diet and exercise extensively ?Recommended to continue current medication ? ?BPH/hair loss (Goal: minimize symptoms and hair growth) ?-Controlled ?-Current treatment  ?Silodosin 8 mg 1 capsule daily - Appropriate, Effective, Safe, Accessible ?Finasteride 5 mg 1 tablet daily - Appropriate, Effective, Safe, Accessible ?-Medications previously tried: none  ?-Recommended to continue current medication ? ?GERD (Goal: minimize symptoms) ?-Controlled ?-Current treatment  ?Omeprazole 40 mg 1 capsule as needed (rarely using) - Appropriate, Effective, Safe, Accessible ?-Medications previously tried: none  ?-Recommended to continue current medication ? ? ?Health Maintenance ?-Vaccine gaps: none ?-Current therapy:  ?Potassium chloride 10 mEq 1 tablet daily ?Multivitamin 1 tablet daily ?Acyclovir 400 mg 1 tablet three times daily as needed for flare ups  (fever blisters) ?-Educated on Herbal supplement research is limited and benefits usually cannot be proven ?Cost vs benefit of each product must be carefully weighed by individual consumer ?-Patient is satisfied with current therapy and denies issues ?-Recommended to continue current medication ? ?Patient Goals/Self-Care Activities ?Patient will:  ?- take medications as prescribed as evidenced by patient report and record review ?check blood pressure weekly, document, and provide at future appointments ? ?Follow Up Plan: The care management team will reach out to the patient again over the next 180 days.  ? ?  ? ? ?Maurice [name] was given information about Chronic Care Management services today including:  ?CCM service includes personalized support from designated clinical staff supervised by his physician, including individualized plan of care and coordination with other care providers ?24/7 contact phone numbers for assistance for urgent and routine care needs. ?Standard insurance, coinsurance, copays and deductibles apply for chronic care management only during months in which we provide at least 20 minutes of these services. Most insurances cover these services at 100%, however patients may be responsible for any copay, coinsurance and/or deductible if applicable. This service may help you avoid the need for more expensive face-to-face services. ?Only one practitioner may furnish and bill the service in a calendar month. ?The patient may stop CCM services at any time (effective at the end of the month) by phone call to the office staff. ? ?Patient agreed to  services and verbal consent obtained.  ? ?Patient verbalizes understanding of instructions and care plan provided today and agrees to view in [place]. Active MyChart status confirmed with patient.   ?The pharmacy team will reach out to the patient again over the next 180 days.  ? ?Viona Gilmore, RPH  ? ?  ?

## 2021-12-27 NOTE — Progress Notes (Signed)
? ?Chronic Care Management ?Pharmacy Note ? ?12/27/2021 ?Name:  Maurice [name] MRN:  [id number] DOB:  06/10/1951 ? ?Summary: ?LDL not at goal < 100 ?BP not quite at goal < 130/80 ? ?Recommendations/Changes made from today's visit: ?-Recommend repeat lipid panel and consider increasing atorvastatin to 80 mg if not at goal ?-Recommended routine BP monitoring at home and bringing BP cuff to office to ensure accuracy ? ?Plan: ?BP assessment in 6 months ? ? ?Subjective: ?Maurice [name] is an 71 y.o. year old male who is a primary patient of [employer], Elyse Jarvis, MD.  The CCM team was consulted for assistance with disease management and care coordination needs.   ? ?Engaged with patient by telephone for initial visit in response to provider referral for pharmacy case management and/or care coordination services.  ? ?Consent to Services:  ?The patient was given the following information about Chronic Care Management services today, agreed to services, and gave verbal consent: 1. CCM service includes personalized support from designated clinical staff supervised by the primary care provider, including individualized plan of care and coordination with other care providers 2. 24/7 contact phone numbers for assistance for urgent and routine care needs. 3. Service will only be billed when office clinical staff spend 20 minutes or more in a month to coordinate care. 4. Only one practitioner may furnish and bill the service in a calendar month. 5.The patient may stop CCM services at any time (effective at the end of the month) by phone call to the office staff. 6. The patient will be responsible for cost sharing (co-pay) of up to 20% of the service fee (after annual deductible is met). Patient agreed to services and consent obtained. ? ?Patient Care Team: ?[name], MD as PCP - General (Family Medicine) ?Thompson Grayer, MD as PCP - Electrophysiology (Cardiology) ?[name] as Librarian, academic  (Dermatology) ?Viona Gilmore, [facility] as Pharmacist (Pharmacist) ? ?Recent office visits: ?03/15/21 [name], MD - Patient presented via video for Paroxysmal atrial fibrillation and other concerns. No medication changes. ?  ?03/07/21 [name], MD - Patient presented for routine general medical exam at a health care facility and other concerns. Plan to start Atorvastatin. ? ?Recent consult visits: ?12/10/21 [name], PA-C (Cardiology) - Patient presented for Persistent A-fib. No medication changes. ?  ?11/05/21 [name], RN (Cardiology) - Remote Device check.  No medication changes. ?  ?10/25/21 [name] (Dermatology) - Patient presented for Inflamed seborrheic keratosis. No medication changes. ?  ?09/05/21 [name] (Cardiology) - Patient presented for Secondary Hypercoagulable state and other concerns. No medication changes. ?  ?08/09/21 [name], NP (Cardiology) - Patient presented for persistent atrial fibrillation and other concerns. No medication changes. ?  ?07/25/21 [name], NP (Cardiology) - Patient presented for Atrial flutter unspecified type and other concerns. No medication changes. ?  ?07/19/21 [name], NP (Cardiology) - Patient presented for Atrial flutter unspecified type and other concerns. No medication changes. ?  ?07/17/21 [name], NP (Cardiology) - Patient presented for Atrial flutter unspecified type and other concerns. No medication changes. ?  ?07/10/21 [name], NP (Cardiology) - Patient presented for Secondary hypercoagulable state and other concerns. No medication changes. ?  ?07/02/21 [name], PA (Cardiology) - Patient presented for Paroxysmal atrial fibrillation. No medication changes. ? ?Hospital visits: ?Medication Reconciliation was completed by comparing discharge summary, patient?s EMR and Pharmacy list, and upon  discussion with patient. ?  ?Patient presented to [facility] on 07/30/21 due to Persistent A-Fib. Patient was present for 3 days ?  ?New?Medications Started at [facility] Discharge:?? ?-started  ?dofetilide (TIKOSYN) ?  ?Medication Changes at Hospital Discharge: ?-Changed  ?none ?  ?Medications Discontinued at Hospital Discharge: ?-Stopped  ?none ?  ?Medications that remain the same after Hospital Discharge:??  ?-All other medications will remain the same.   ?  ?Patient presented to [facility] on 07/11/21 for Transesophageal Echocardiogram (TEE) . Patient was present for 3 hours ?  ?New?Medications Started at [facility] Discharge:?? ?-started  ?none ?  ?Medication Changes at Hospital Discharge: ?-Changed  ?none ?  ?Medications Discontinued at Hospital Discharge: ?-Stopped  ?none ?  ?Medications that remain the same after Hospital Discharge:??  ?-All other medications will remain the same.   ? ?Objective: ? ?Lab Results  ?Component Value Date  ? CREATININE 1.04 12/10/2021  ? BUN 15 12/10/2021  ? GFR 65.34 10/19/2012  ? EGFR 77 12/10/2021  ? GFRNONAA >60 08/21/2021  ? GFRAA >60 03/24/2020  ? NA 141 12/10/2021  ? K 4.2 12/10/2021  ? CALCIUM 9.2 12/10/2021  ? CO2 25 12/10/2021  ? GLUCOSE 86 12/10/2021  ? ? ?Lab Results  ?Component Value Date/Time  ? GFR 65.34 10/19/2012 10:04 AM  ?  ?Last diabetic Eye exam: No results found for: HMDIABEYEEXA  ?Last diabetic Foot exam: No results found for: HMDIABFOOTEX  ? ?Lab Results  ?Component Value Date  ? CHOL 183 03/07/2021  ? HDL 46 03/07/2021  ? LDLCALC 112 (H) 03/07/2021  ? TRIG 140 03/07/2021  ? CHOLHDL 4.0 03/07/2021  ? ? ?Hepatic Function Latest Ref Rng & Units 07/10/2021 03/07/2021 02/26/2019  ?Total Protein 6.5 - 8.1 g/dL 6.5 6.9 6.7  ?Albumin 3.5 - 5.0 g/dL 3.7 4.7 4.5  ?AST 15 - 41 U/L '18 22 28  ' ?ALT 0 - 44 U/L 25 28 42  ?Alk Phosphatase 38 - 126 U/L 71 85 68  ?Total Bilirubin 0.3 - 1.2 mg/dL 0.9 1.0 0.9  ?Bilirubin, Direct 0.0 - 0.3 mg/dL - - -  ? ? ?Lab Results  ?Component Value Date/Time  ? TSH  2.302 07/10/2021 12:53 PM  ? TSH 1.77 10/10/2008 09:29 AM  ? ? ?CBC 09/05/2021 07/10/2021 03/07/2021  ?WBC WILL FOLLOW 8.3 5.0  ?Hemoglobin WILL FOLLOW 15.9 16.6  ?Hematocrit WILL FOLLOW 48.5 49.3  ?Platelets WILL FOLLOW 205 199  ? ? ?No results found for: VD25OH ? ?Clinical ASCVD: No  ?The 10-year ASCVD risk score (Arnett DK, et al., 2019) is: 22.8% ?  Values used to calculate the score: ?    Age: 37 years ?    Sex: Male ?    Is Non-Hispanic African American: No ?    Diabetic: No ?    Tobacco smoker: No ?    Systolic Blood Pressure: 841 mmHg ?    Is BP treated: Yes ?    HDL Cholesterol: 46 mg/dL ?    Total Cholesterol: 183 mg/dL   ? ?Depression screen [facility] 2/9 03/15/2021 03/07/2021 03/06/2020  ?Decreased Interest 0 0 0  ?Down, Depressed, Hopeless 0 0 0  ?PHQ - 2 Score 0 0 0  ?  ?CHA2DS2/VAS Stroke Risk Points  Current as of 47 minutes ago  ?   2 >= 2 Points: High Risk  ?1 - 1.99 Points: Medium Risk  ?0 Points: Low Risk  ?  Last Change: N/A   ?  ? Details   ?  This score determines the patient's risk of having a stroke if the  ?patient has atrial fibrillation.  ?  ?  ? Points Metrics  ?0 Has Congestive Heart Failure:  No   ? Current as of 47 minutes ago  ?0 Has Vascular Disease:  No   ? Current as of 47 minutes ago  ?1 Has Hypertension:  Yes   ? Current as of 47 minutes ago  ?1 Age:  62   ? Current as of 47 minutes ago  ?0 Has Diabetes:  No   ? Current as of 47 minutes ago  ?0 Had Stroke:  No  Had TIA:  No  Had Thromboembolism:  No   ? Current as of 47 minutes ago  ?0 Male:  No   ? Current as of 47 minutes ago  ?  ? ? ?Social History  ? ?Tobacco Use  ?Smoking Status Never  ? Passive exposure: Never  ?Smokeless Tobacco Never  ? ?BP Readings from Last 3 Encounters:  ?12/10/21 136/82  ?09/05/21 130/72  ?08/09/21 134/76  ? ?Pulse Readings from Last 3 Encounters:  ?12/10/21 85  ?09/05/21 (!) 58  ?08/09/21 (!) 49  ? ?Wt Readings from Last 3 Encounters:  ?12/10/21 184 lb (83.5 kg)  ?09/05/21 183 lb (83 kg)  ?08/09/21 182 lb 6.4  oz (82.7 kg)  ? ?BMI Readings from Last 3 Encounters:  ?12/10/21 27.17 kg/m?  ?09/05/21 27.02 kg/m?  ?08/09/21 26.94 kg/m?  ? ? ?Assessment/Interventions: Review of patient past medical history, allergies, med

## 2022-01-11 DIAGNOSIS — I480 Paroxysmal atrial fibrillation: Secondary | ICD-10-CM | POA: Diagnosis not present

## 2022-01-11 DIAGNOSIS — E7800 Pure hypercholesterolemia, unspecified: Secondary | ICD-10-CM | POA: Diagnosis not present

## 2022-01-14 ENCOUNTER — Ambulatory Visit (INDEPENDENT_AMBULATORY_CARE_PROVIDER_SITE_OTHER): Payer: Medicare Other

## 2022-01-14 DIAGNOSIS — I4819 Other persistent atrial fibrillation: Secondary | ICD-10-CM | POA: Diagnosis not present

## 2022-01-15 ENCOUNTER — Ambulatory Visit: Payer: Medicare Other | Admitting: Physician Assistant

## 2022-01-15 LAB — CUP PACEART REMOTE DEVICE CHECK
Date Time Interrogation Session: 20230404082649
Implantable Pulse Generator Implant Date: 20201228

## 2022-01-24 NOTE — Progress Notes (Signed)
Carelink Summary Report / Loop Recorder 

## 2022-02-18 ENCOUNTER — Ambulatory Visit (INDEPENDENT_AMBULATORY_CARE_PROVIDER_SITE_OTHER): Payer: Medicare Other

## 2022-02-18 DIAGNOSIS — I480 Paroxysmal atrial fibrillation: Secondary | ICD-10-CM | POA: Diagnosis not present

## 2022-02-18 LAB — CUP PACEART REMOTE DEVICE CHECK
Date Time Interrogation Session: 20230508124127
Implantable Pulse Generator Implant Date: 20201228

## 2022-02-26 ENCOUNTER — Other Ambulatory Visit (HOSPITAL_COMMUNITY): Payer: Self-pay | Admitting: Nurse Practitioner

## 2022-03-05 NOTE — Progress Notes (Signed)
Carelink Summary Report / Loop Recorder 

## 2022-03-12 ENCOUNTER — Other Ambulatory Visit: Payer: Self-pay | Admitting: Family Medicine

## 2022-03-12 DIAGNOSIS — E785 Hyperlipidemia, unspecified: Secondary | ICD-10-CM

## 2022-03-12 DIAGNOSIS — L649 Androgenic alopecia, unspecified: Secondary | ICD-10-CM

## 2022-03-13 ENCOUNTER — Ambulatory Visit (INDEPENDENT_AMBULATORY_CARE_PROVIDER_SITE_OTHER): Payer: Medicare Other | Admitting: Family Medicine

## 2022-03-13 ENCOUNTER — Encounter: Payer: Self-pay | Admitting: Family Medicine

## 2022-03-13 VITALS — BP 110/70 | HR 68 | Temp 96.8°F | Ht 68.0 in | Wt 180.4 lb

## 2022-03-13 DIAGNOSIS — E7800 Pure hypercholesterolemia, unspecified: Secondary | ICD-10-CM

## 2022-03-13 DIAGNOSIS — Z8042 Family history of malignant neoplasm of prostate: Secondary | ICD-10-CM | POA: Diagnosis not present

## 2022-03-13 DIAGNOSIS — K219 Gastro-esophageal reflux disease without esophagitis: Secondary | ICD-10-CM

## 2022-03-13 DIAGNOSIS — Z0000 Encounter for general adult medical examination without abnormal findings: Secondary | ICD-10-CM

## 2022-03-13 DIAGNOSIS — D6869 Other thrombophilia: Secondary | ICD-10-CM | POA: Diagnosis not present

## 2022-03-13 DIAGNOSIS — I10 Essential (primary) hypertension: Secondary | ICD-10-CM | POA: Diagnosis not present

## 2022-03-13 DIAGNOSIS — Z86010 Personal history of colonic polyps: Secondary | ICD-10-CM

## 2022-03-13 DIAGNOSIS — E785 Hyperlipidemia, unspecified: Secondary | ICD-10-CM

## 2022-03-13 DIAGNOSIS — L649 Androgenic alopecia, unspecified: Secondary | ICD-10-CM

## 2022-03-13 MED ORDER — ATORVASTATIN CALCIUM 40 MG PO TABS: ORAL_TABLET | ORAL | 3 refills | Status: DC

## 2022-03-13 MED ORDER — FINASTERIDE 5 MG PO TABS: ORAL_TABLET | ORAL | 3 refills | Status: DC

## 2022-03-13 NOTE — Progress Notes (Signed)
   Subjective:    Patient ID: Maurice [name], male    DOB: 1951/04/07, 71 y.o.   MRN: [id number]  HPI He is here for complete examination.  He does have a long history of difficulty with atrial fibrillation and now has an implant.  He has had several ablations in the past and presently is now on Tikosyn which seems to be controlling his symptoms.  He is also taking Xarelto and having no difficulty with that.  Continues on Cardizem.  He is having slight difficulty with sinus congestion and slight dizziness but finds this very minimal.  He has seen urology in the past and presently is taking Rapaflo with good results.  He is on atorvastatin for his lipids and having no difficulty with that.  He does have history of colonic polyps and is scheduled for routine follow-up concerning that.  He does have a family history of prostate cancer and needs follow-up on that as well.  He continues on finasteride for that as well as for hair growth.  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  They are in a long-term relationship which seems to be working.   Review of Systems  All other systems reviewed and are negative.     Objective:   Physical Exam Alert and in no distress. Tympanic membranes and canals are normal. Pharyngeal area is normal. Neck is supple without adenopathy or thyromegaly. Cardiac exam shows a regular sinus rhythm without murmurs or gallops. Lungs are clear to auscultation.        Assessment & Plan:  Routine general medical examination at a health care facility - Plan: CBC with Differential/Platelet, Comprehensive metabolic panel, Lipid panel  Pure hypercholesterolemia - Plan: Lipid panel  Essential hypertension, benign - Plan: CBC with Differential/Platelet, Comprehensive metabolic panel  Secondary hypercoagulable state ([place])  Gastroesophageal reflux disease without esophagitis  History of colonic polyps  Family history of prostate cancer - Plan: PSA  Hyperlipidemia,  unspecified hyperlipidemia type - Plan: atorvastatin (LIPITOR) 40 MG tablet  Male pattern baldness - Plan: finasteride (PROSCAR) 5 MG tablet He will continue on his present medication regimen.  He will follow-up with cardiology for his A-fib as well as with urology and gastroenterology.

## 2022-03-14 LAB — CBC WITH DIFFERENTIAL/PLATELET
Basophils Absolute: 0 10*3/uL (ref 0.0–0.2)
Basos: 0 %
EOS (ABSOLUTE): 0.1 10*3/uL (ref 0.0–0.4)
Eos: 2 %
Hematocrit: 48.3 % (ref 37.5–51.0)
Hemoglobin: 16.2 g/dL (ref 13.0–17.7)
Immature Grans (Abs): 0 10*3/uL (ref 0.0–0.1)
Immature Granulocytes: 0 %
Lymphocytes Absolute: 2.3 10*3/uL (ref 0.7–3.1)
Lymphs: 43 %
MCH: 29.4 pg (ref 26.6–33.0)
MCHC: 33.5 g/dL (ref 31.5–35.7)
MCV: 88 fL (ref 79–97)
Monocytes Absolute: 0.6 10*3/uL (ref 0.1–0.9)
Monocytes: 11 %
Neutrophils Absolute: 2.4 10*3/uL (ref 1.4–7.0)
Neutrophils: 44 %
Platelets: 202 10*3/uL (ref 150–450)
RBC: 5.51 x10E6/uL (ref 4.14–5.80)
RDW: 12.9 % (ref 11.6–15.4)
WBC: 5.4 10*3/uL (ref 3.4–10.8)

## 2022-03-14 LAB — COMPREHENSIVE METABOLIC PANEL
ALT: 27 IU/L (ref 0–44)
AST: 24 IU/L (ref 0–40)
Albumin/Globulin Ratio: 1.9 (ref 1.2–2.2)
Albumin: 4.3 g/dL (ref 3.8–4.8)
Alkaline Phosphatase: 79 IU/L (ref 44–121)
BUN/Creatinine Ratio: 14 (ref 10–24)
BUN: 14 mg/dL (ref 8–27)
Bilirubin Total: 0.8 mg/dL (ref 0.0–1.2)
CO2: 22 mmol/L (ref 20–29)
Calcium: 9.7 mg/dL (ref 8.6–10.2)
Chloride: 104 mmol/L (ref 96–106)
Creatinine, Ser: 1.01 mg/dL (ref 0.76–1.27)
Globulin, Total: 2.3 g/dL (ref 1.5–4.5)
Glucose: 97 mg/dL (ref 70–99)
Potassium: 4.4 mmol/L (ref 3.5–5.2)
Sodium: 140 mmol/L (ref 134–144)
Total Protein: 6.6 g/dL (ref 6.0–8.5)
eGFR: 80 mL/min/{1.73_m2} (ref >59)

## 2022-03-14 LAB — LIPID PANEL
Chol/HDL Ratio: 3.4 ratio (ref 0.0–5.0)
Cholesterol, Total: 199 mg/dL (ref 100–199)
HDL: 58 mg/dL (ref >39)
LDL Chol Calc (NIH): 116 mg/dL — ABNORMAL HIGH (ref 0–99)
Triglycerides: 144 mg/dL (ref 0–149)
VLDL Cholesterol Cal: 25 mg/dL (ref 5–40)

## 2022-03-14 LAB — PSA: Prostate Specific Ag, Serum: 0.2 ng/mL (ref 0.0–4.0)

## 2022-03-18 ENCOUNTER — Telehealth: Payer: Medicare Other | Admitting: Family Medicine

## 2022-03-18 ENCOUNTER — Encounter: Payer: Self-pay | Admitting: Family Medicine

## 2022-03-18 VITALS — BP 110/70 | Wt 180.0 lb

## 2022-03-18 DIAGNOSIS — Z0000 Encounter for general adult medical examination without abnormal findings: Secondary | ICD-10-CM | POA: Diagnosis not present

## 2022-03-18 DIAGNOSIS — E7800 Pure hypercholesterolemia, unspecified: Secondary | ICD-10-CM

## 2022-03-18 DIAGNOSIS — K219 Gastro-esophageal reflux disease without esophagitis: Secondary | ICD-10-CM

## 2022-03-18 DIAGNOSIS — D6869 Other thrombophilia: Secondary | ICD-10-CM

## 2022-03-18 DIAGNOSIS — Z86010 Personal history of colonic polyps: Secondary | ICD-10-CM

## 2022-03-18 DIAGNOSIS — I10 Essential (primary) hypertension: Secondary | ICD-10-CM

## 2022-03-18 NOTE — Progress Notes (Signed)
Maurice [name] is a 71 y.o. male who presents for annual wellness visit and follow-up on chronic medical conditions.  He has No particular concerns or complaints.  He follows up regularly with cardiology and has seen urology and is doing well there.  Continues on Lipitor with no difficulties.  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  Immunizations and Health Maintenance Immunization History  Administered Date(s) Administered   Fluad Quad(high Dose 65+) 07/15/2019, 08/02/2021   Hepatitis A 02/15/2009   IPV 10/14/1962   Influenza Split 07/23/2013, 07/22/2015   Influenza, High Dose Seasonal PF 07/25/2017, 09/25/2018   Influenza-Unspecified 07/28/2014, 07/26/2016   MMR 10/15/1959, 09/23/1988   Meningococcal Polysaccharide 07/23/2013   PFIZER(Purple Top)SARS-COV-2 Vaccination 11/18/2019, 12/09/2019, 05/08/2020, 02/22/2021   Pfizer Covid-19 Vaccine Bivalent Booster 4yr & up 08/02/2021   Pneumococcal Conjugate-13 11/01/2016   Pneumococcal Polysaccharide-23 11/13/2017   Tdap 07/30/2007, 11/03/2016   Zoster Recombinat (Shingrix) 03/17/2018, 05/19/2018   Zoster, Live 05/18/2013   There are no preventive care reminders to display for this patient.  Last colonoscopy:  07/06/18 Dr. MCollene MaresGI Q five years Last PSA: 03/13/22 Dentist: Q six months  Ophtho: Q  year Exercise: 6 days a week at gym  Other doctors caring for patient include: Dr. MCollene MaresGI            Dr. TChalmers Catercardiology             Dr. SLucienne Capersdermatology            Dr. NMilford Cageurology  Advanced Directives: Does Patient Have a Medical Advance Directive?: Yes Type of Advance Directive: Living will Does patient want to make changes to medical advance directive?: No - Patient declined  Depression screen:  See questionnaire below.        03/18/2022   10:09 AM 03/15/2021   11:10 AM 03/07/2021    8:39 AM 03/06/2020    8:36 AM 02/26/2019   10:27 AM  Depression screen PHQ 2/9   Decreased Interest 0 0 0 0 0  Down, Depressed, Hopeless 0 0 0 0 0  PHQ - 2 Score 0 0 0 0 0    Fall Screen: See Questionaire below.      03/18/2022   10:09 AM 03/15/2021   11:10 AM 03/06/2020    8:36 AM 02/26/2019   10:27 AM 11/13/2017    8:36 AM  Fall Risk   Falls in the past year? 0 0 0 0 No  Number falls in past yr: 0 0     Injury with Fall? 0 1     Risk for fall due to : No Fall Risks No Fall Risks     Follow up Falls evaluation completed Falls evaluation completed       ADL screen:  See questionnaire below.  Functional Status Survey: Is the patient deaf or have difficulty hearing?: No Does the patient have difficulty seeing, even when wearing glasses/contacts?: No Does the patient have difficulty concentrating, remembering, or making decisions?: No Does the patient have difficulty walking or climbing stairs?: No Does the patient have difficulty dressing or bathing?: No Does the patient have difficulty doing errands alone such as visiting a doctor's office or shopping?: No   Review of Systems  Constitutional: -, -unexpected weight change, -anorexia, -fatigue Allergy: -sneezing, -itching, -congestion    PHYSICAL EXAM:  General Appearance: Alert, cooperative, no distress, appears stated age Head: Normocephalic, without obvious abnormality, atraumatic Psych: Normal mood, affect,  hygiene and grooming  ASSESSMENT/PLAN: Pure hypercholesterolemia  Essential hypertension, benign  Secondary hypercoagulable state ([place])  Gastroesophageal reflux disease without esophagitis  History of colonic polyps   Discussed PSA screening (risks/benefits), recommended at least 30 minutes of aerobic activity at least 5 days/week; Immunization recommendations discussed.  Colonoscopy recommendations reviewed.   Medicare Attestation I have personally reviewed: The patient's medical and social history Their use of alcohol, tobacco or illicit drugs Their current medications and  supplements The patient's functional ability including ADLs,fall risks, home safety risks, cognitive, and hearing and visual impairment Diet and physical activities Evidence for depression or mood disorders  The patient's weight, height, and BMI have been recorded in the chart.  I have made referrals, counseling, and provided education to the patient based on review of the above and I have provided the patient with a written personalized care plan for preventive services.     [name], MD   03/18/2022

## 2022-03-25 ENCOUNTER — Ambulatory Visit (INDEPENDENT_AMBULATORY_CARE_PROVIDER_SITE_OTHER): Payer: Medicare Other

## 2022-03-25 DIAGNOSIS — I480 Paroxysmal atrial fibrillation: Secondary | ICD-10-CM

## 2022-03-27 LAB — CUP PACEART REMOTE DEVICE CHECK
Date Time Interrogation Session: 20230613122745
Implantable Pulse Generator Implant Date: 20201228

## 2022-03-28 DIAGNOSIS — M1812 Unilateral primary osteoarthritis of first carpometacarpal joint, left hand: Secondary | ICD-10-CM | POA: Diagnosis not present

## 2022-04-11 DIAGNOSIS — M25511 Pain in right shoulder: Secondary | ICD-10-CM | POA: Diagnosis not present

## 2022-04-17 NOTE — Progress Notes (Signed)
Carelink Summary Report / Loop Recorder 

## 2022-04-22 DIAGNOSIS — M25511 Pain in right shoulder: Secondary | ICD-10-CM | POA: Diagnosis not present

## 2022-04-23 ENCOUNTER — Other Ambulatory Visit: Payer: Self-pay | Admitting: Family Medicine

## 2022-04-23 DIAGNOSIS — Z8619 Personal history of other infectious and parasitic diseases: Secondary | ICD-10-CM

## 2022-04-24 NOTE — Telephone Encounter (Signed)
[facility] is requesting to fill pt acyclovir. Please advise [facility]

## 2022-04-25 LAB — CUP PACEART REMOTE DEVICE CHECK
Date Time Interrogation Session: 20230713090158
Implantable Pulse Generator Implant Date: 20201228

## 2022-04-29 ENCOUNTER — Ambulatory Visit (INDEPENDENT_AMBULATORY_CARE_PROVIDER_SITE_OTHER): Payer: Medicare Other

## 2022-04-29 DIAGNOSIS — I480 Paroxysmal atrial fibrillation: Secondary | ICD-10-CM

## 2022-05-01 DIAGNOSIS — M19011 Primary osteoarthritis, right shoulder: Secondary | ICD-10-CM | POA: Diagnosis not present

## 2022-05-01 DIAGNOSIS — M25511 Pain in right shoulder: Secondary | ICD-10-CM | POA: Diagnosis not present

## 2022-06-03 ENCOUNTER — Ambulatory Visit (INDEPENDENT_AMBULATORY_CARE_PROVIDER_SITE_OTHER): Payer: Medicare Other

## 2022-06-03 DIAGNOSIS — I480 Paroxysmal atrial fibrillation: Secondary | ICD-10-CM

## 2022-06-03 NOTE — Progress Notes (Signed)
Carelink Summary Report / Loop Recorder 

## 2022-06-04 LAB — CUP PACEART REMOTE DEVICE CHECK
Date Time Interrogation Session: 20230822073418
Implantable Pulse Generator Implant Date: 20201228

## 2022-06-06 ENCOUNTER — Other Ambulatory Visit: Payer: Self-pay | Admitting: Family Medicine

## 2022-06-06 ENCOUNTER — Other Ambulatory Visit (HOSPITAL_COMMUNITY): Payer: Self-pay | Admitting: Student

## 2022-06-06 DIAGNOSIS — Z8619 Personal history of other infectious and parasitic diseases: Secondary | ICD-10-CM

## 2022-06-06 DIAGNOSIS — I480 Paroxysmal atrial fibrillation: Secondary | ICD-10-CM

## 2022-06-07 NOTE — Telephone Encounter (Signed)
Xarelto '20mg'$  refill request received. Pt is 71 years old, weight-81.6kg, Crea-1.01 on 03/13/2022, last seen by Oda Kilts on 12/10/2021, Diagnosis-Afib, CrCl-77.62m/min; Dose is appropriate based on dosing criteria. Will send in refill to requested pharmacy.

## 2022-06-14 ENCOUNTER — Telehealth: Payer: Self-pay | Admitting: Pharmacist

## 2022-06-14 NOTE — Chronic Care Management (AMB) (Signed)
Chronic Care Management Pharmacy Assistant   Name: Maurice [name]  MRN: [id number] DOB: Jan 08, 1951  Reason for Encounter: Disease State   Conditions to be addressed/monitored: HTN  Recent office visits:  03/18/22 [name], MD - Patient presented for Pure hypercholesterolemia and other concerns. No medication changes.  03/13/22 [name], MD - Patient presented for Routine general medical exam at a health care facility and other concerns. Stopped Diltiazem.  Recent consult visits:  05/01/22 [name], MD - Patient presented for Chronic multifactorial right shoulder pain. No medication changes.  04/22/22 Patient presented for Pain of right shoulder pain, MRI done. No medication changes.  04/11/22 [name], MD -  Patient presented for Pain of right shoulder pain. No medication changes.  03/28/22  [name], MD -  Patient presented for Left Thumb arthritis. XR Done. No medication changes.  Hospital visits:  None in previous 6 months  Medications: Outpatient Encounter Medications as of 06/14/2022  Medication Sig   acyclovir (ZOVIRAX) 400 MG tablet TAKE 1 TABLET BY MOUTH THREE TIMES DAILY AS NEEDED AS DIRECTED   atorvastatin (LIPITOR) 40 MG tablet TAKE 1 TABLET(40 MG) BY MOUTH DAILY   diltiazem (CARDIZEM CD) 120 MG 24 hr capsule TAKE 1 CAPSULE(120 MG) BY MOUTH DAILY   dofetilide (TIKOSYN) 250 MCG capsule TAKE ONE CAPSULE BY MOUTH TWICE A DAY   finasteride (PROSCAR) 5 MG tablet TAKE 1 TABLET(5 MG) BY MOUTH DAILY   Multiple Vitamin (MULTI VITAMIN PO) Take 1 tablet by mouth daily.    omeprazole (PRILOSEC) 40 MG capsule Take 40 mg by mouth daily as needed (heartburn or acid reflux). (Patient not taking: Reported on 12/27/2021)   potassium chloride (KLOR-CON) 10 MEQ tablet Take 1 tablet (10 mEq total) by mouth daily.   silodosin (RAPAFLO) 8 MG CAPS capsule Take 8 mg by mouth daily.   XARELTO 20 MG TABS tablet TAKE 1 TABLET(20 MG) BY MOUTH DAILY WITH SUPPER   No  facility-administered encounter medications on file as of 06/14/2022.  Reviewed chart prior to disease state call. Spoke with patient regarding BP  Recent Office Vitals: BP Readings from Last 3 Encounters:  03/18/22 110/70  03/13/22 110/70  12/10/21 136/82   Pulse Readings from Last 3 Encounters:  03/13/22 68  12/10/21 85  09/05/21 (!) 58    Wt Readings from Last 3 Encounters:  03/18/22 180 lb (81.6 kg)  03/13/22 180 lb 6.4 oz (81.8 kg)  12/10/21 184 lb (83.5 kg)     Kidney Function Lab Results  Component Value Date/Time   CREATININE 1.01 03/13/2022 02:44 PM   CREATININE 1.04 12/10/2021 12:40 PM   CREATININE 0.90 11/01/2016 11:16 AM   CREATININE 0.97 10/19/2015 12:01 AM   GFR 65.34 10/19/2012 10:04 AM   GFRNONAA >60 08/21/2021 11:27 AM   GFRAA >60 03/24/2020 08:22 AM       Latest Ref Rng & Units 03/13/2022    2:44 PM 12/10/2021   12:40 PM 09/05/2021   12:21 PM  BMP  Glucose 70 - 99 mg/dL 97  86  97   BUN 8 - 27 mg/dL '14  15  19   '$ Creatinine 0.76 - 1.27 mg/dL 1.01  1.04  0.90   BUN/Creat Ratio 10 - '24 14  14  21   '$ Sodium 134 - 144 mmol/L 140  141  140   Potassium 3.5 - 5.2 mmol/L 4.4  4.2  4.2   Chloride 96 - 106 mmol/L 104  105  104  CO2 20 - 29 mmol/L '22  25  23   '$ Calcium 8.6 - 10.2 mg/dL 9.7  9.2  9.2     Current antihypertensive regimen:  Diltiazem 120 mg 1 capsule daily  - Appropriate, Query effective, Safe, Accessible  Patient reports he has been doing well with his pressures no issues and 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 He reports he would like for his acyclovir to [facility] with refills origninal script was written for 30 DS no fills and hed like to not have to get approval when he is needing it filled. Forwarded to [facility] Clinical Pharmacist for assistance in the matter.  Adherence Review: Is the patient currently on ACE/ARB medication? No Does the patient have >5 day gap between last estimated fill dates? No    Care Gaps: COVID Booster -  Overdue Flu Vaccine - Overdue CCM- Decl at this time BP- 110/70 03/13/22 AWV- 6/23  Star Rating Drugs: Atorvastatin 40 mg - Last filled 06/10/22 90 DS at [place] Pharmacist Assistant [phone]

## 2022-06-18 ENCOUNTER — Ambulatory Visit: Payer: Medicare Other | Admitting: Physician Assistant

## 2022-06-19 ENCOUNTER — Encounter: Payer: Self-pay | Admitting: Internal Medicine

## 2022-06-19 ENCOUNTER — Other Ambulatory Visit: Payer: Self-pay | Admitting: Family Medicine

## 2022-06-19 DIAGNOSIS — Z8619 Personal history of other infectious and parasitic diseases: Secondary | ICD-10-CM

## 2022-06-19 MED ORDER — ACYCLOVIR 400 MG PO TABS: ORAL_TABLET | ORAL | 5 refills | Status: DC

## 2022-06-24 DIAGNOSIS — N401 Enlarged prostate with lower urinary tract symptoms: Secondary | ICD-10-CM | POA: Diagnosis not present

## 2022-06-24 DIAGNOSIS — R3915 Urgency of urination: Secondary | ICD-10-CM | POA: Diagnosis not present

## 2022-06-24 DIAGNOSIS — R3912 Poor urinary stream: Secondary | ICD-10-CM | POA: Diagnosis not present

## 2022-06-28 ENCOUNTER — Ambulatory Visit (INDEPENDENT_AMBULATORY_CARE_PROVIDER_SITE_OTHER): Payer: Medicare Other | Admitting: Family Medicine

## 2022-06-28 ENCOUNTER — Encounter: Payer: Self-pay | Admitting: Family Medicine

## 2022-06-28 VITALS — BP 102/64 | HR 85 | Temp 98.6°F | Wt 171.4 lb

## 2022-06-28 DIAGNOSIS — J0100 Acute maxillary sinusitis, unspecified: Secondary | ICD-10-CM | POA: Diagnosis not present

## 2022-06-28 MED ORDER — AMOXICILLIN-POT CLAVULANATE 875-125 MG PO TABS: 1.0000 | ORAL_TABLET | Freq: Two times a day (BID) | ORAL | 0 refills | Status: DC

## 2022-06-28 NOTE — Progress Notes (Signed)
   Subjective:    Patient ID: Maurice [name], male    DOB: 1951/06/30, 71 y.o.   MRN: [id number]  HPI He states that 2 weeks ago he developed the onset of fever, chills, sinus congestion and headache.  The symptoms have continued as well as fatigue, anorexia, 1 episode of vomiting and postnasal drainage.  He has had a history of sinusitis in the past.   Review of Systems     Objective:   Physical Exam Alert and in no distress but toxic-appearing.  Nasal mucosa is normal with no tenderness over sinuses.  No axillary abdominal or inguinal adenopathy noted.. Tympanic membranes and canals are normal. Pharyngeal area is normal. Neck is supple without adenopathy or thyromegaly. Cardiac exam shows a regular sinus rhythm without murmurs or gallops. Lungs are clear to auscultation.        Assessment & Plan:  Acute maxillary sinusitis, recurrence not specified - Plan: CBC with Differential/Platelet, Comprehensive metabolic panel, amoxicillin-clavulanate (AUGMENTIN) 875-125 MG tablet I will treat him as if he has a sinusitis but this is the sickness have seen him in quite some time.  Feels appropriate to get some blood work on him.

## 2022-06-29 LAB — CBC WITH DIFFERENTIAL/PLATELET
Basophils Absolute: 0 10*3/uL (ref 0.0–0.2)
Basos: 0 %
EOS (ABSOLUTE): 0.1 10*3/uL (ref 0.0–0.4)
Eos: 1 %
Hematocrit: 42.8 % (ref 37.5–51.0)
Hemoglobin: 14.4 g/dL (ref 13.0–17.7)
Immature Grans (Abs): 0 10*3/uL (ref 0.0–0.1)
Immature Granulocytes: 0 %
Lymphocytes Absolute: 2.2 10*3/uL (ref 0.7–3.1)
Lymphs: 16 %
MCH: 29.1 pg (ref 26.6–33.0)
MCHC: 33.6 g/dL (ref 31.5–35.7)
MCV: 87 fL (ref 79–97)
Monocytes Absolute: 1 10*3/uL — ABNORMAL HIGH (ref 0.1–0.9)
Monocytes: 7 %
Neutrophils Absolute: 10.4 10*3/uL — ABNORMAL HIGH (ref 1.4–7.0)
Neutrophils: 76 %
Platelets: 302 10*3/uL (ref 150–450)
RBC: 4.94 x10E6/uL (ref 4.14–5.80)
RDW: 12.8 % (ref 11.6–15.4)
WBC: 13.7 10*3/uL — ABNORMAL HIGH (ref 3.4–10.8)

## 2022-06-29 LAB — COMPREHENSIVE METABOLIC PANEL
ALT: 43 IU/L (ref 0–44)
AST: 24 IU/L (ref 0–40)
Albumin/Globulin Ratio: 1.2 (ref 1.2–2.2)
Albumin: 3.6 g/dL — ABNORMAL LOW (ref 3.8–4.8)
Alkaline Phosphatase: 110 IU/L (ref 44–121)
BUN/Creatinine Ratio: 18 (ref 10–24)
BUN: 15 mg/dL (ref 8–27)
Bilirubin Total: 0.6 mg/dL (ref 0.0–1.2)
CO2: 22 mmol/L (ref 20–29)
Calcium: 9.1 mg/dL (ref 8.6–10.2)
Chloride: 100 mmol/L (ref 96–106)
Creatinine, Ser: 0.85 mg/dL (ref 0.76–1.27)
Globulin, Total: 3.1 g/dL (ref 1.5–4.5)
Glucose: 111 mg/dL — ABNORMAL HIGH (ref 70–99)
Potassium: 4.5 mmol/L (ref 3.5–5.2)
Sodium: 138 mmol/L (ref 134–144)
Total Protein: 6.7 g/dL (ref 6.0–8.5)
eGFR: 93 mL/min/{1.73_m2} (ref >59)

## 2022-06-29 NOTE — Progress Notes (Signed)
Carelink Summary Report / Loop Recorder 

## 2022-07-01 DIAGNOSIS — M19011 Primary osteoarthritis, right shoulder: Secondary | ICD-10-CM | POA: Diagnosis not present

## 2022-07-08 ENCOUNTER — Ambulatory Visit (INDEPENDENT_AMBULATORY_CARE_PROVIDER_SITE_OTHER): Payer: Medicare Other

## 2022-07-08 DIAGNOSIS — I480 Paroxysmal atrial fibrillation: Secondary | ICD-10-CM | POA: Diagnosis not present

## 2022-07-08 LAB — CUP PACEART REMOTE DEVICE CHECK
Date Time Interrogation Session: 20230921104814
Implantable Pulse Generator Implant Date: 20201228

## 2022-07-18 NOTE — Progress Notes (Signed)
Carelink Summary Report / Loop Recorder 

## 2022-07-22 NOTE — Progress Notes (Unsigned)
Electrophysiology Office Note Date: 07/22/2022  ID:  Maurice [name], DOB [birth year], MRN [id number]  PCP: [name], MD Primary Cardiologist: None Electrophysiologist: Thompson Grayer, MD   CC: ILR follow-up  Maurice [name] is a 71 y.o. male seen today for {EPMDS:28135} . he presents today for {VISITTYPE:28148}.  Device History: Medtronic loop recorder implanted *** for Atrial fibrillation  Past Medical History:  Diagnosis Date   Atrial fibrillation ([place])    paroxysmal s/p PVI by JA 2010, 2014, 2017   Atypical mole 06/22/2013   mild - r chest   Atypical mole 08/23/2014   moderate - l paraspinal   Atypical mole 03/26/2016   mild- R lower back (recheck evert 80ms)   Basal cell carcinoma of skin 07/16/2018   Nod-R Nare (MOHS)   Diverticulosis    Colonoscopy 07/2013   Herpes    History of hepatitis B    Hypercholesterolemia    Squamous cell carcinoma of skin of right thumb 08/28/2014   insitu - R Thumb - currett, cautery   Varicose veins    Past Surgical History:  Procedure Laterality Date   atrail fibrillation ablation  7/10, 1//17/14   [place] 10/[place]/2012   Procedure: [place]  Surgeon: JThompson Grayer MD;  [facility];  Service: Cardiovascular;  Laterality: N/A;   ATRIAL FIBRILLATION ABLATION N/A 03/24/2020   Procedure: ATRIAL FIBRILLATION ABLATION;  Surgeon: AThompson Grayer MD;  [facility];  Service: Cardiovascular;  Laterality: N/A;   CARDIOVERSION N/A 09/20/2015   Procedure: CARDIOVERSION;  Surgeon: PThayer Headings MD;  [place]  Service: Cardiovascular;  Laterality: N/A;   CARDIOVERSION N/A 09/28/2015   Procedure: CARDIOVERSION;  Surgeon: BLelon Perla MD;  [facility];  Service: Cardiovascular;  Laterality: N/A;   CARDIOVERSION N/A 07/11/2021   Procedure: CARDIOVERSION;  Surgeon: OGeralynn Rile MD;  [place]   Service: Cardiovascular;  Laterality: N/A;   COLONOSCOPY  2004   ELECTROPHYSIOLOGIC STUDY N/A 11/28/2015   3rd AF ablation by Dr ARayann Heman  implantable loop recorder placement  10/11/2019   Medtronic Reveal [place] LNQ22 (SN [id number]) by Dr ARayann Hemanfor afib management post ablation   SKIN BIOPSY Right 11/03/2018   TEE WITHOUT CARDIOVERSION  10/29/2012   Procedure: TRANSESOPHAGEAL ECHOCARDIOGRAM (TEE);  Surgeon: PFay Records MD;  [facility];  Service: Cardiovascular;  Laterality: N/A;  pre ablation   TEE WITHOUT CARDIOVERSION N/A 09/20/2015   Procedure: TRANSESOPHAGEAL ECHOCARDIOGRAM (TEE);  Surgeon: PThayer Headings MD;  [place]  Service: Cardiovascular;  Laterality: N/A;   TEE WITHOUT CARDIOVERSION N/A 07/11/2021   Procedure: TRANSESOPHAGEAL ECHOCARDIOGRAM (TEE);  Surgeon: OGeralynn Rile MD;  [place]  Service: Cardiovascular;  Laterality: N/A;   TONSILLECTOMY     when child    Current Outpatient Medications  Medication Sig Dispense Refill   acyclovir (ZOVIRAX) 400 MG tablet TAKE 1 TABLET BY MOUTH THREE TIMES DAILY AS NEEDED AS DIRECTED 30 tablet 5   amoxicillin-clavulanate (AUGMENTIN) 875-125 MG tablet Take 1 tablet by mouth 2 (two) times daily. 20 tablet 0   atorvastatin (LIPITOR) 40 MG tablet TAKE 1 TABLET(40 MG) BY MOUTH DAILY 90 tablet 3   diltiazem (CARDIZEM CD) 120 MG 24 hr capsule TAKE 1 CAPSULE(120 MG) BY MOUTH DAILY 90 capsule 3   dofetilide (TIKOSYN) 250 MCG capsule TAKE ONE CAPSULE BY MOUTH TWICE A DAY 180 capsule 1  finasteride (PROSCAR) 5 MG tablet TAKE 1 TABLET(5 MG) BY MOUTH DAILY 90 tablet 3   Multiple Vitamin (MULTI VITAMIN PO) Take 1 tablet by mouth daily.      omeprazole (PRILOSEC) 40 MG capsule Take 40 mg by mouth daily as needed (heartburn or acid reflux). (Patient not taking: Reported on 12/27/2021)     potassium chloride (KLOR-CON) 10 MEQ tablet Take 1 tablet (10 mEq total) by mouth daily. 90 tablet 3   silodosin  (RAPAFLO) 8 MG CAPS capsule Take 8 mg by mouth daily.     XARELTO 20 MG TABS tablet TAKE 1 TABLET(20 MG) BY MOUTH DAILY WITH SUPPER 30 tablet 6   No current facility-administered medications for this visit.    Allergies:   Patient has no known allergies.   Social History: Social History   Socioeconomic History   Marital status: Single    Spouse name: Not on file   Number of children: Not on file   Years of education: Not on file   Highest education level: Not on file  Occupational History   Occupation: owns yoga studio    Employer: [employer]   Occupation: bookkeeper  Tobacco Use   Smoking status: Never    Passive exposure: Never   Smokeless tobacco: Never  Substance and Sexual Activity   Alcohol use: Yes    Alcohol/week: 3.0 standard drinks of alcohol    Types: 3 Cans of beer per week   Drug use: No   Sexual activity: Yes  Other Topics Concern   Not on file  Social History Narrative   Not on file   Social Determinants of Health   Financial Resource Strain: Low Risk  (12/27/2021)   Overall Financial Resource Strain (CARDIA)    Difficulty of Paying Living Expenses: Not very hard  Food Insecurity: Not on file  Transportation Needs: No Transportation Needs (12/27/2021)   PRAPARE - Hydrologist (Medical): No    Lack of Transportation (Non-Medical): No  Physical Activity: Not on file  Stress: Not on file  Social Connections: Not on file  Intimate Partner Violence: Not on file    Family History: Family History  Problem Relation Age of Onset   Hypertension Father    Hypertension Brother    Hyperlipidemia Brother    Stroke Mother      Review of Systems: All other systems reviewed and are otherwise negative except as noted above.  Physical Exam: There were no vitals filed for this visit.   GEN- The patient is well appearing, alert and oriented x 3 today.   HEENT: normocephalic, atraumatic; sclera clear, conjunctiva pink;  hearing intact; oropharynx clear; neck supple  Lungs- Clear to ausculation bilaterally, normal work of breathing.  No wheezes, rales, rhonchi Heart- Regular rate and rhythm, no murmurs, rubs or gallops  GI- soft, non-tender, non-distended, bowel sounds present  Extremities- no clubbing, cyanosis, or edema  MS- no significant deformity or atrophy Skin- warm and dry, no rash or lesion; ILR pocket well healed Psych- euthymic mood, full affect Neuro- strength and sensation are intact  PPM Interrogation- reviewed in detail today,  See PACEART report  EKG:  EKG is ordered today. The ekg ordered today shows ***  Recent Labs: 12/10/2021: Magnesium 2.3 06/28/2022: ALT 43; BUN 15; Creatinine, Ser 0.85; Hemoglobin 14.4; Platelets 302; Potassium 4.5; Sodium 138   Wt Readings from Last 3 Encounters:  06/28/22 171 lb 6.4 oz (77.7 kg)  03/18/22 180 lb (81.6 kg)  03/13/22  180 lb 6.4 oz (81.8 kg)     Other studies Reviewed: Additional studies/ records that were reviewed today include: Previous EP office notes, Previous remote checks, Most recent labwork.   Assessment and Plan:  1. Atrial fibrillation s/p Medtronic Loop recorder Normal device function See Pace Art report No changes today EKG today shows *** on tikosyn 250 mcg BID AF burden ***   Current medicines are reviewed at length with the patient today.   The patient {ACTIONS; HAS/DOES NOT HAVE:19233} concerns regarding his medicines.  The following changes were made today:  {NONE DEFAULTED:18576}  Labs/ tests ordered today include: *** No orders of the defined types were placed in this encounter.    Disposition:   Follow up with {EPMDS:28135}  in *** {Blank single:19197::"Months","Weeks"}    Signed, [name], PA-C  07/22/2022 1:13 PM  [address]  [place] [phone] (office) [phone] (fax)

## 2022-07-23 ENCOUNTER — Encounter: Payer: Self-pay | Admitting: Internal Medicine

## 2022-07-24 ENCOUNTER — Ambulatory Visit: Payer: Medicare Other | Attending: Student | Admitting: Student

## 2022-07-24 ENCOUNTER — Encounter: Payer: Self-pay | Admitting: Student

## 2022-07-24 VITALS — BP 112/68 | HR 52 | Ht 68.0 in | Wt 176.0 lb

## 2022-07-24 DIAGNOSIS — I480 Paroxysmal atrial fibrillation: Secondary | ICD-10-CM | POA: Diagnosis not present

## 2022-07-24 LAB — BASIC METABOLIC PANEL
BUN/Creatinine Ratio: 17 (ref 10–24)
BUN: 15 mg/dL (ref 8–27)
CO2: 25 mmol/L (ref 20–29)
Calcium: 9.3 mg/dL (ref 8.6–10.2)
Chloride: 104 mmol/L (ref 96–106)
Creatinine, Ser: 0.89 mg/dL (ref 0.76–1.27)
Glucose: 62 mg/dL — ABNORMAL LOW (ref 70–99)
Potassium: 4.6 mmol/L (ref 3.5–5.2)
Sodium: 142 mmol/L (ref 134–144)
eGFR: 92 mL/min/{1.73_m2} (ref >59)

## 2022-07-24 LAB — MAGNESIUM: Magnesium: 2.2 mg/dL (ref 1.6–2.3)

## 2022-07-24 MED ORDER — DILTIAZEM HCL ER COATED BEADS 180 MG PO CP24: ORAL_CAPSULE | ORAL | 3 refills | Status: DC

## 2022-07-24 NOTE — Patient Instructions (Signed)
Medication Instructions:  Your physician has recommended you make the following change in your medication:   INCREASE: Diltiazem to '180mg'$  daily  *If you need a refill on your cardiac medications before your next appointment, please call your pharmacy*   Lab Work: TODAY: BMET, Mag  If you have labs (blood work) drawn today and your tests are completely normal, you will receive your results only by: [place] (if you have MyChart) OR A paper copy in the mail If you have any lab test that is abnormal or we need to change your treatment, we will call you to review the results.   Follow-Up: At [facility], you and your health needs are our priority.  As part of our continuing mission to provide you with exceptional heart care, we have created designated Provider Care Teams.  These Care Teams include your primary Cardiologist (physician) and Advanced Practice Providers (APPs -  Physician Assistants and Nurse Practitioners) who all work together to provide you with the care you need, when you need it.  Your next appointment:   6 month(s)  The format for your next appointment:   In Person  Provider:   Doralee Albino, MD     Important Information About Sugar

## 2022-07-26 ENCOUNTER — Other Ambulatory Visit (HOSPITAL_COMMUNITY): Payer: Self-pay | Admitting: *Deleted

## 2022-07-26 MED ORDER — POTASSIUM CHLORIDE ER 10 MEQ PO TBCR: 10.0000 meq | EXTENDED_RELEASE_TABLET | Freq: Every day | ORAL | 3 refills | Status: DC

## 2022-07-30 ENCOUNTER — Telehealth: Payer: Self-pay | Admitting: Licensed Clinical Social Worker

## 2022-07-30 NOTE — Patient Outreach (Signed)
  Care Coordination   Initial Visit Note   07/30/2022 Name: MIGUEL CHRISTIANA MRN: [id number] DOB: 27-Mar-1951  NIKOLA MARONE is a 71 y.o. year old male who sees [name], MD for primary care. I spoke with  Gillis Ends by phone today.  What matters to the patients health and wellness today?  Care Coordination    Goals Addressed             This Visit's Progress    COMPLETED: Care Coordination Activities-No Follow Up Required       Care Coordination Interventions: Active listening / Reflection utilized  LCSW informed patient of care coordination services. Pt is not interested at this time and agreed to contact PCP, should needs arise  LCSW informed pt of available vaccines at PCP office. Pt shared he obtained Covid-19 and Flu vaccines last week through [employer]. Message sent to Minette Headland, CMA to update in MyChart         SDOH assessments and interventions completed:  No     Care Coordination Interventions Activated:  Yes  Care Coordination Interventions:  Yes, provided   Follow up plan: No further intervention required.   Encounter Outcome:  Pt. Refused   Christa See, MSW, [email or web address] Phone [phone] 4:11 PM

## 2022-07-30 NOTE — Patient Instructions (Signed)
Visit Information  Thank you for taking time to visit with me today. Please don't hesitate to contact me if I can be of assistance to you.   Following are the goals we discussed today:   Goals Addressed             This Visit's Progress    COMPLETED: Care Coordination Activities-No Follow Up Required       Care Coordination Interventions: Active listening / Reflection utilized  LCSW informed patient of care coordination services. Pt is not interested at this time and agreed to contact PCP, should needs arise  LCSW informed pt of available vaccines at PCP office. Pt shared he obtained Covid-19 and Flu vaccines last week through [employer]. Message sent to Minette Headland, [place] to update in MyChart         If you are experiencing a Mental Health or [place] or need someone to talk to, please call the Suicide and Crisis Lifeline: 988 call 911   Patient verbalizes understanding of instructions and care plan provided today and agrees to view in [place]. Active MyChart status and patient understanding of how to access instructions and care plan via MyChart confirmed with patient.     No further follow up required:    Christa See, MSW, [email or web address] Phone [phone] 4:12 PM

## 2022-08-05 ENCOUNTER — Encounter: Payer: Self-pay | Admitting: Internal Medicine

## 2022-08-12 ENCOUNTER — Ambulatory Visit (INDEPENDENT_AMBULATORY_CARE_PROVIDER_SITE_OTHER): Payer: Medicare Other

## 2022-08-12 DIAGNOSIS — I480 Paroxysmal atrial fibrillation: Secondary | ICD-10-CM

## 2022-08-12 LAB — CUP PACEART REMOTE DEVICE CHECK
Date Time Interrogation Session: 20231029231210
Implantable Pulse Generator Implant Date: 20201228

## 2022-09-10 NOTE — Progress Notes (Signed)
Carelink Summary Report / Loop Recorder 

## 2022-09-16 ENCOUNTER — Ambulatory Visit (INDEPENDENT_AMBULATORY_CARE_PROVIDER_SITE_OTHER): Payer: Medicare Other

## 2022-09-16 DIAGNOSIS — I480 Paroxysmal atrial fibrillation: Secondary | ICD-10-CM

## 2022-09-16 LAB — CUP PACEART REMOTE DEVICE CHECK
Date Time Interrogation Session: 20231203231834
Implantable Pulse Generator Implant Date: 20201228

## 2022-10-21 ENCOUNTER — Ambulatory Visit (INDEPENDENT_AMBULATORY_CARE_PROVIDER_SITE_OTHER): Payer: BLUE CROSS/BLUE SHIELD

## 2022-10-21 DIAGNOSIS — I480 Paroxysmal atrial fibrillation: Secondary | ICD-10-CM

## 2022-10-21 LAB — CUP PACEART REMOTE DEVICE CHECK
Date Time Interrogation Session: 20240107232430
Implantable Pulse Generator Implant Date: 20201228

## 2022-10-23 NOTE — Progress Notes (Signed)
Carelink Summary Report / Loop Recorder 

## 2022-11-21 ENCOUNTER — Encounter (HOSPITAL_COMMUNITY): Payer: Self-pay | Admitting: *Deleted

## 2022-11-22 ENCOUNTER — Other Ambulatory Visit (HOSPITAL_COMMUNITY): Payer: Self-pay | Admitting: Nurse Practitioner

## 2022-11-22 NOTE — Progress Notes (Signed)
Carelink Summary Report / Loop Recorder 

## 2022-11-24 LAB — CUP PACEART REMOTE DEVICE CHECK
Date Time Interrogation Session: 20240209231209
Implantable Pulse Generator Implant Date: 20201228

## 2022-11-25 ENCOUNTER — Other Ambulatory Visit (HOSPITAL_COMMUNITY): Payer: Self-pay | Admitting: *Deleted

## 2022-11-25 ENCOUNTER — Ambulatory Visit: Payer: Medicare Other

## 2022-11-25 DIAGNOSIS — I480 Paroxysmal atrial fibrillation: Secondary | ICD-10-CM

## 2022-11-25 MED ORDER — DOFETILIDE 250 MCG PO CAPS: 250.0000 ug | ORAL_CAPSULE | Freq: Two times a day (BID) | ORAL | 1 refills | Status: DC

## 2022-12-25 DIAGNOSIS — R3915 Urgency of urination: Secondary | ICD-10-CM | POA: Diagnosis not present

## 2022-12-25 DIAGNOSIS — N401 Enlarged prostate with lower urinary tract symptoms: Secondary | ICD-10-CM | POA: Diagnosis not present

## 2022-12-25 DIAGNOSIS — R3912 Poor urinary stream: Secondary | ICD-10-CM | POA: Diagnosis not present

## 2022-12-26 DIAGNOSIS — H5203 Hypermetropia, bilateral: Secondary | ICD-10-CM | POA: Diagnosis not present

## 2022-12-30 ENCOUNTER — Ambulatory Visit (INDEPENDENT_AMBULATORY_CARE_PROVIDER_SITE_OTHER): Payer: Medicare Other

## 2022-12-30 DIAGNOSIS — I480 Paroxysmal atrial fibrillation: Secondary | ICD-10-CM | POA: Diagnosis not present

## 2022-12-31 LAB — CUP PACEART REMOTE DEVICE CHECK
Date Time Interrogation Session: 20240317231742
Implantable Pulse Generator Implant Date: 20201228

## 2023-01-02 ENCOUNTER — Telehealth: Payer: Self-pay

## 2023-01-02 NOTE — Progress Notes (Signed)
Patient ID: Maurice [name], male   DOB: 12-Oct-1951, 72 y.o.   MRN: [id number]  Care Management & Coordination Services Pharmacy Team   Reason for Encounter: General adherence update   Contacted patient for general health update and medication adherence call.  Spoke with patient on 01/02/2023   What concerns do you have about your medications? Patient reports he is doing well no concerns.   The patient denies side effects with their medications.   How often do you forget or accidentally miss a dose? Never  Do you use a pillbox? No  Are you having any problems getting your medications from your pharmacy? No  Has the cost of your medications been a concern? No  The patient has not had an ED visit since last contact.   The patient denies problems with their health.   Patient denies concerns or questions for Theo Dills, PharmD at this time. He rpeorts he does not have any extensive medication issues and only prefers follow up with PCP and Cardiology whom are managing his medications well.   Counseled patient on: [place] job taking medications    Chart Updates:  Recent office visits:  None  Recent consult visits:  10/21/22 [name], RN - Clinical encounter for Paroxysmal atrial fibrillation.   09/16/22 [name], RN - Clinical encounter for Paroxysmal atrial fibrillation.  08/12/22 [name], RN - Clinical encounter for Paroxysmal atrial fibrillation.  07/24/22 [name], PA-C (Cardiology) - Patient presented for Paroxysmal atrial fibrillation. Increased Diltiazem. Stopped Amoxicillin.   07/08/22 [name], RN - Clinical encounter for Paroxysmal atrial fibrillation.  Hospital visits:  None in previous 6 months  Medications: Outpatient Encounter Medications as of 01/02/2023  Medication Sig Note   acyclovir (ZOVIRAX) 400 MG tablet TAKE 1 TABLET BY MOUTH THREE TIMES DAILY AS NEEDED AS DIRECTED 06/28/2022: Prn last dose a  few weeks ago   atorvastatin (LIPITOR) 40 MG tablet TAKE 1 TABLET(40 MG) BY MOUTH DAILY    diltiazem (CARDIZEM CD) 180 MG 24 hr capsule TAKE 1 CAPSULE(180 MG) BY MOUTH DAILY    dofetilide (TIKOSYN) 250 MCG capsule Take 1 capsule (250 mcg total) by mouth 2 (two) times daily.    finasteride (PROSCAR) 5 MG tablet TAKE 1 TABLET(5 MG) BY MOUTH DAILY    Multiple Vitamin (MULTI VITAMIN PO) Take 1 tablet by mouth daily.     omeprazole (PRILOSEC) 40 MG capsule Take 40 mg by mouth daily as needed (heartburn or acid reflux).    potassium chloride (KLOR-CON) 10 MEQ tablet Take 1 tablet (10 mEq total) by mouth daily.    silodosin (RAPAFLO) 8 MG CAPS capsule Take 8 mg by mouth daily.    XARELTO 20 MG TABS tablet TAKE 1 TABLET(20 MG) BY MOUTH DAILY WITH SUPPER    No facility-administered encounter medications on file as of 01/02/2023.    Recent Relevant Labs: No results found for: "HGBA1C", "MICROALBUR"  Kidney Function Lab Results  Component Value Date/Time   CREATININE 0.89 07/24/2022 11:13 AM   CREATININE 0.85 06/28/2022 04:01 PM   CREATININE 0.90 11/01/2016 11:16 AM   CREATININE 0.97 10/19/2015 12:01 AM   GFR 65.34 10/19/2012 10:04 AM   GFRNONAA >60 08/21/2021 11:27 AM   GFRAA >60 03/24/2020 08:22 AM    Star Rating Drugs:  Atorvastatin 40 mg - Last filled 12/11/22 90 DS at [place]: [place] - 03/2022  [place] Clinical Pharmacist Assistant [phone]

## 2023-01-07 ENCOUNTER — Other Ambulatory Visit (HOSPITAL_COMMUNITY): Payer: Self-pay | Admitting: Student

## 2023-01-07 DIAGNOSIS — I480 Paroxysmal atrial fibrillation: Secondary | ICD-10-CM

## 2023-01-07 NOTE — Progress Notes (Signed)
Carelink Summary Report / Loop Recorder 

## 2023-01-08 DIAGNOSIS — H524 Presbyopia: Secondary | ICD-10-CM | POA: Diagnosis not present

## 2023-01-08 NOTE — Telephone Encounter (Signed)
Pt last saw Oda Kilts, [place] on 07/24/2022, last labs 07/24/22 Creat 0.89, age 72, weight 79.8kg, CrCl 85.93, based on specified criteria pt is on appropriate dosage Xarelto 20mg  QD for afib.  Will refill rx.

## 2023-01-27 DIAGNOSIS — M19011 Primary osteoarthritis, right shoulder: Secondary | ICD-10-CM | POA: Diagnosis not present

## 2023-01-27 DIAGNOSIS — M25811 Other specified joint disorders, right shoulder: Secondary | ICD-10-CM | POA: Diagnosis not present

## 2023-01-28 ENCOUNTER — Encounter: Payer: Self-pay | Admitting: Cardiovascular Disease

## 2023-01-28 ENCOUNTER — Ambulatory Visit: Payer: Medicare Other | Attending: Cardiovascular Disease | Admitting: Cardiovascular Disease

## 2023-01-28 VITALS — BP 133/85 | HR 150 | Ht 68.0 in | Wt 179.4 lb

## 2023-01-28 DIAGNOSIS — D6869 Other thrombophilia: Secondary | ICD-10-CM | POA: Diagnosis not present

## 2023-01-28 DIAGNOSIS — Z79899 Other long term (current) drug therapy: Secondary | ICD-10-CM

## 2023-01-28 DIAGNOSIS — I480 Paroxysmal atrial fibrillation: Secondary | ICD-10-CM | POA: Diagnosis not present

## 2023-01-28 NOTE — Patient Instructions (Signed)
Medication Instructions:  Your physician recommends that you continue on your current medications as directed. Please refer to the Current Medication list given to you today. *If you need a refill on your cardiac medications before your next appointment, please call your pharmacy*   Follow-Up: At [facility], you and your health needs are our priority.  As part of our continuing mission to provide you with exceptional heart care, we have created designated Provider Care Teams.  These Care Teams include your primary Cardiologist (physician) and Advanced Practice Providers (APPs -  Physician Assistants and Nurse Practitioners) who all work together to provide you with the care you need, when you need it.  We recommend signing up for the patient portal called "MyChart".  Sign up information is provided on this After Visit Summary.  MyChart is used to connect with patients for Virtual Visits (Telemedicine).  Patients are able to view lab/test results, encounter notes, upcoming appointments, etc.  Non-urgent messages can be sent to your provider as well.   To learn more about what you can do with MyChart, go to [email or web address].    Your next appointment:   6 month(s)  Provider:   Augustus Mealor, MD  

## 2023-01-28 NOTE — Progress Notes (Signed)
Electrophysiology Office Note:    Date:  01/28/2023   ID:  [name], DOB [birth year], MRN [id number]  PCP:  Maurice Nian, MD   [facility] Providers Cardiologist:  None Electrophysiologist:  Maurice Small, MD     Referring MD: Maurice Nian, MD   History of Present Illness:    Maurice [name] is a 72 y.o. male with a hx listed below, significant for paroxysmal atrial fibrillation who presents for follow-up.  He has had prior atrial fibrillation ablations by Dr. Johney Frame, most recently June 2021.  At that time his right veins and posterior wall "box" had reconnected and required revision.  Since, he had recurrence and was placed on Tikosyn.  On Tikosyn, he has had very rare episodes of atrial fibrillation.  His most recent recording shows a burden of less than 0.04%.  He is aware of the episodes when he has them.  He is in atrial fibrillation today, 01/28/2023.  His ventricular rate is typically quite rapid in atrial fibrillation, 130-150 bpm.  Episodes do not last more than an hour.  According to his loop recorder, he has only had 1 other episode in the past 2 months.  He currently has palpitation and rapid rates in AF. No syncope, pre-syncope, or chest pain.     Past Medical History:  Diagnosis Date   Atrial fibrillation    paroxysmal s/p PVI by JA 2010, 2014, 2017   Atypical mole 06/22/2013   mild - r chest   Atypical mole 08/23/2014   moderate - l paraspinal   Atypical mole 03/26/2016   mild- R lower back (recheck evert )   Basal cell carcinoma of skin 07/16/2018   Nod-R Nare (MOHS)   Diverticulosis    Colonoscopy 07/2013   Herpes    History of hepatitis B    Hypercholesterolemia    Squamous cell carcinoma of skin of right thumb 08/28/2014   insitu - R Thumb - currett, cautery   Varicose veins     Past Surgical History:  Procedure Laterality Date   atrail fibrillation ablation  7/10, 1//17/14   PVI by JA   ATRIAL FIBRILLATION  ABLATION N/A 10/29/2012   Procedure: ATRIAL FIBRILLATION ABLATION;  Surgeon: Maurice Range, MD;  [name];  Service: Cardiovascular;  Laterality: N/A;   ATRIAL FIBRILLATION ABLATION N/A 03/24/2020   Procedure: ATRIAL FIBRILLATION ABLATION;  Surgeon: Maurice Range, MD;  Location: [facility] INVASIVE CV LAB;  Service: Cardiovascular;  Laterality: N/A;   CARDIOVERSION N/A 09/20/2015   Procedure: CARDIOVERSION;  Surgeon: Maurice Mixer, MD;  Location: [facility] ENDOSCOPY;  Service: Cardiovascular;  Laterality: N/A;   CARDIOVERSION N/A 09/28/2015   Procedure: CARDIOVERSION;  Surgeon: Maurice Bunting, MD;  Location: [facility] ENDOSCOPY;  Service: Cardiovascular;  Laterality: N/A;   CARDIOVERSION N/A 07/11/2021   Procedure: CARDIOVERSION;  Surgeon: Maurice Rives, MD;  Location: [facility] ENDOSCOPY;  Service: Cardiovascular;  Laterality: N/A;   COLONOSCOPY  2004   ELECTROPHYSIOLOGIC STUDY N/A 11/28/2015   3rd AF ablation by Dr Johney Frame   implantable loop recorder placement  10/11/2019   Medtronic Reveal [place] model LNQ22 (SN [id number]) by Dr Johney Frame for afib management post ablation   SKIN BIOPSY Right 11/03/2018   TEE WITHOUT CARDIOVERSION  10/29/2012   Procedure: TRANSESOPHAGEAL ECHOCARDIOGRAM (TEE);  Surgeon: Pricilla Riffle, MD;  Location: [facility] ENDOSCOPY;  Service: Cardiovascular;  Laterality: N/A;  pre ablation   TEE WITHOUT CARDIOVERSION N/A 09/20/2015   Procedure: TRANSESOPHAGEAL ECHOCARDIOGRAM (TEE);  Surgeon: Maurice Mixer, MD;  Location: [facility] ENDOSCOPY;  Service: Cardiovascular;  Laterality: N/A;   TEE WITHOUT CARDIOVERSION N/A 07/11/2021   Procedure: TRANSESOPHAGEAL ECHOCARDIOGRAM (TEE);  Surgeon: Maurice Rives, MD;  Location: [facility] ENDOSCOPY;  Service: Cardiovascular;  Laterality: N/A;   TONSILLECTOMY     when child    Current Medications: Current Meds  Medication Sig   acyclovir (ZOVIRAX) 400 MG tablet TAKE 1 TABLET BY MOUTH THREE TIMES DAILY AS NEEDED AS DIRECTED   atorvastatin (LIPITOR) 40 MG  tablet TAKE 1 TABLET(40 MG) BY MOUTH DAILY   diltiazem (CARDIZEM CD) 180 MG 24 hr capsule TAKE 1 CAPSULE(180 MG) BY MOUTH DAILY   dofetilide (TIKOSYN) 250 MCG capsule Take 1 capsule (250 mcg total) by mouth 2 (two) times daily.   finasteride (PROSCAR) 5 MG tablet TAKE 1 TABLET(5 MG) BY MOUTH DAILY   Multiple Vitamin (MULTI VITAMIN PO) Take 1 tablet by mouth daily.    omeprazole (PRILOSEC) 40 MG capsule Take 40 mg by mouth daily as needed (heartburn or acid reflux).   XARELTO 20 MG TABS tablet TAKE 1 TABLET(20 MG) BY MOUTH DAILY WITH SUPPER     Allergies:   Patient has no known allergies.   Social and Family History: Reviewed in Epic  ROS:   Please see the history of present illness.    All other systems reviewed and are negative.  EKGs/Labs/Other Studies Reviewed Today:    Echocardiogram:  TEE 07/11/2021 EF 50-55%  Monitors:       ILR in place  Stress testing:   Advanced imaging:   EKG:  Last EKG results: today -- AF with RVR   Recent Labs: 06/28/2022: ALT 43; Hemoglobin 14.4; Platelets 302 07/24/2022: BUN 15; Creatinine, Ser 0.89; Magnesium 2.2; Potassium 4.6; Sodium 142     Physical Exam:    VS:  BP 133/85   Pulse (!) 150   Ht  (1.727 m)   Wt 179 lb 6.4 oz (81.4 kg)   SpO2 94%   BMI 27.28 kg/m     Wt Readings from Last 3 Encounters:  01/28/23 179 lb 6.4 oz (81.4 kg)  07/24/22 176 lb (79.8 kg)  06/28/22 171 lb 6.4 oz (77.7 kg)     GEN:  Well nourished, well developed in no acute distress CARDIAC: irregular tachy rhythm, no murmurs, rubs, gallops RESPIRATORY:  Normal work of breathing MUSCULOSKELETAL: no edema    ASSESSMENT & PLAN:    Paroxysmal atrial fibrillation Status post repeat ablations, most recently in 2021 with reconnection of the right pulmonary veins and posterior wall Currently maintained on Tikosyn with AF burden less than 0.5% Rates in A-fib are not controlled Labs from 10/11 reviewed Last sinus ECG 07/24/22 reviewed, Qtc <  430 Continue Cardizem 180 mg  High risk medication - dofetilide Labs from 10/11 reviewed Last sinus ECG 07/24/22 reviewed, Qtc < 430  Secondary hypercoagulable state due to atrial fibrillation Continue Xarelto 20 mg        Medication Adjustments/Labs and Tests Ordered: Current medicines are reviewed at length with the patient today.  Concerns regarding medicines are outlined above.  No orders of the defined types were placed in this encounter.  No orders of the defined types were placed in this encounter.    Signed, Maurice Small, MD  01/28/2023 4:32 PM    [facility]

## 2023-02-03 ENCOUNTER — Ambulatory Visit: Payer: Medicare Other

## 2023-02-03 LAB — CUP PACEART REMOTE DEVICE CHECK
Date Time Interrogation Session: 20240419230620
Implantable Pulse Generator Implant Date: 20201228

## 2023-02-04 DIAGNOSIS — Z129 Encounter for screening for malignant neoplasm, site unspecified: Secondary | ICD-10-CM | POA: Diagnosis not present

## 2023-02-04 DIAGNOSIS — L814 Other melanin hyperpigmentation: Secondary | ICD-10-CM | POA: Diagnosis not present

## 2023-02-04 DIAGNOSIS — L821 Other seborrheic keratosis: Secondary | ICD-10-CM | POA: Diagnosis not present

## 2023-02-04 DIAGNOSIS — L905 Scar conditions and fibrosis of skin: Secondary | ICD-10-CM | POA: Diagnosis not present

## 2023-02-05 NOTE — Progress Notes (Signed)
Carelink Summary Report / Loop Recorder 

## 2023-02-06 ENCOUNTER — Ambulatory Visit (INDEPENDENT_AMBULATORY_CARE_PROVIDER_SITE_OTHER): Payer: Medicare Other

## 2023-02-06 DIAGNOSIS — I480 Paroxysmal atrial fibrillation: Secondary | ICD-10-CM | POA: Diagnosis not present

## 2023-02-20 DIAGNOSIS — Z85828 Personal history of other malignant neoplasm of skin: Secondary | ICD-10-CM | POA: Diagnosis not present

## 2023-02-20 DIAGNOSIS — L814 Other melanin hyperpigmentation: Secondary | ICD-10-CM | POA: Diagnosis not present

## 2023-02-20 DIAGNOSIS — L905 Scar conditions and fibrosis of skin: Secondary | ICD-10-CM | POA: Diagnosis not present

## 2023-02-20 DIAGNOSIS — B078 Other viral warts: Secondary | ICD-10-CM | POA: Diagnosis not present

## 2023-02-20 DIAGNOSIS — D225 Melanocytic nevi of trunk: Secondary | ICD-10-CM | POA: Diagnosis not present

## 2023-02-20 DIAGNOSIS — D0461 Carcinoma in situ of skin of right upper limb, including shoulder: Secondary | ICD-10-CM | POA: Diagnosis not present

## 2023-03-04 NOTE — Progress Notes (Signed)
Carelink Summary Report / Loop Recorder 

## 2023-03-06 LAB — CUP PACEART REMOTE DEVICE CHECK
Date Time Interrogation Session: 20240522231020
Implantable Pulse Generator Implant Date: 20201228

## 2023-03-07 ENCOUNTER — Other Ambulatory Visit: Payer: Self-pay | Admitting: Family Medicine

## 2023-03-07 DIAGNOSIS — E785 Hyperlipidemia, unspecified: Secondary | ICD-10-CM

## 2023-03-07 DIAGNOSIS — L649 Androgenic alopecia, unspecified: Secondary | ICD-10-CM

## 2023-03-11 ENCOUNTER — Ambulatory Visit (INDEPENDENT_AMBULATORY_CARE_PROVIDER_SITE_OTHER): Payer: Medicare Other

## 2023-03-11 DIAGNOSIS — I480 Paroxysmal atrial fibrillation: Secondary | ICD-10-CM | POA: Diagnosis not present

## 2023-03-21 ENCOUNTER — Telehealth: Payer: Self-pay

## 2023-03-21 NOTE — Telephone Encounter (Signed)
PT FOLLOWS DR. Nelly Laurence. Left message to call back to set up tele pre op appt.

## 2023-03-21 NOTE — Telephone Encounter (Signed)
   Name: Maurice [name]  DOB: [birth year]  MRN: [id number]  Primary Cardiologist: None  Chart reviewed as part of pre-operative protocol coverage. Because of Maurice [name]'s past medical history and time since last visit, he will require a follow-up telephone visit in order to better assess preoperative cardiovascular risk.  Pre-op covering staff: - Please schedule appointment and call patient to inform them. If patient already had an upcoming appointment within acceptable timeframe, please add "pre-op clearance" to the appointment notes so provider is aware. - Please contact requesting surgeon's office via preferred method (i.e, phone, fax) to inform them of need for appointment prior to surgery.  No medications indicated as needing held.  Sharlene Dory, PA-C  03/21/2023, 4:32 PM

## 2023-03-21 NOTE — Telephone Encounter (Signed)
   Pre-operative Risk Assessment    Patient Name: Maurice [name]  DOB: 10-26-1950 MRN: [id number]      Request for Surgical Clearance    Procedure:   Right Reverse Shoulder Arthroplasty  Date of Surgery:  Clearance 06/06/23                                 Surgeon:  dr. Francena Hanly Surgeon's Group or Practice Name:  [facility] Phone number:  [phone] Fax number:  [phone] ATTN: Aida Raider   Type of Clearance Requested:   - Medical    Type of Anesthesia:  General    Additional requests/questions:    SignedZada Finders   03/21/2023, 11:27 AM

## 2023-03-24 ENCOUNTER — Telehealth: Payer: Self-pay

## 2023-03-24 NOTE — Telephone Encounter (Signed)
PT IS SCHEDULED 08/05 AT 9:40AM. CONSENT DONE. MEDS WILL NEED TO BE REVIEW

## 2023-03-24 NOTE — Telephone Encounter (Signed)
PT IS SCHEDULED 08/05 AT 9:40AM. CONSENT DONE. MEDS WILL NEED TO BE REVIEW    Patient Consent for Virtual Visit        Maurice [name] has provided verbal consent on 03/24/2023 for a virtual visit (video or telephone).   CONSENT FOR VIRTUAL VISIT FOR:  Maurice [name]  By participating in this virtual visit I agree to the following:  I hereby voluntarily request, consent and authorize [facility] and its employed or contracted physicians, physician assistants, nurse practitioners or other licensed health care professionals (the Practitioner), to provide me with telemedicine health care services (the "Services") as deemed necessary by the treating Practitioner. I acknowledge and consent to receive the Services by the Practitioner via telemedicine. I understand that the telemedicine visit will involve communicating with the Practitioner through live audiovisual communication technology and the disclosure of certain medical information by electronic transmission. I acknowledge that I have been given the opportunity to request an in-person assessment or other available alternative prior to the telemedicine visit and am voluntarily participating in the telemedicine visit.  I understand that I have the right to withhold or withdraw my consent to the use of telemedicine in the course of my care at any time, without affecting my right to future care or treatment, and that the Practitioner or I may terminate the telemedicine visit at any time. I understand that I have the right to inspect all information obtained and/or recorded in the course of the telemedicine visit and may receive copies of available information for a reasonable fee.  I understand that some of the potential risks of receiving the Services via telemedicine include:  Delay or interruption in medical evaluation due to technological equipment failure or disruption; Information transmitted may not be sufficient (e.g. poor  resolution of images) to allow for appropriate medical decision making by the Practitioner; and/or  In rare instances, security protocols could fail, causing a breach of personal health information.  Furthermore, I acknowledge that it is my responsibility to provide information about my medical history, conditions and care that is complete and accurate to the best of my ability. I acknowledge that Practitioner's advice, recommendations, and/or decision may be based on factors not within their control, such as incomplete or inaccurate data provided by me or distortions of diagnostic images or specimens that may result from electronic transmissions. I understand that the practice of medicine is not an exact science and that Practitioner makes no warranties or guarantees regarding treatment outcomes. I acknowledge that a copy of this consent can be made available to me via my patient portal [facility] MyChart), or I can request a printed copy by calling the office of [facility].    I understand that my insurance will be billed for this visit.   I have read or had this consent read to me. I understand the contents of this consent, which adequately explains the benefits and risks of the Services being provided via telemedicine.  I have been provided ample opportunity to ask questions regarding this consent and the Services and have had my questions answered to my satisfaction. I give my informed consent for the services to be provided through the use of telemedicine in my medical care

## 2023-04-01 ENCOUNTER — Ambulatory Visit (INDEPENDENT_AMBULATORY_CARE_PROVIDER_SITE_OTHER): Payer: Medicare Other | Admitting: Family Medicine

## 2023-04-01 ENCOUNTER — Encounter: Payer: Self-pay | Admitting: Family Medicine

## 2023-04-01 VITALS — BP 128/78 | HR 59 | Temp 97.8°F | Resp 16 | Ht 68.0 in | Wt 175.4 lb

## 2023-04-01 DIAGNOSIS — I10 Essential (primary) hypertension: Secondary | ICD-10-CM

## 2023-04-01 DIAGNOSIS — I480 Paroxysmal atrial fibrillation: Secondary | ICD-10-CM

## 2023-04-01 DIAGNOSIS — Z0000 Encounter for general adult medical examination without abnormal findings: Secondary | ICD-10-CM

## 2023-04-01 DIAGNOSIS — K219 Gastro-esophageal reflux disease without esophagitis: Secondary | ICD-10-CM

## 2023-04-01 DIAGNOSIS — L649 Androgenic alopecia, unspecified: Secondary | ICD-10-CM | POA: Diagnosis not present

## 2023-04-01 DIAGNOSIS — I8391 Asymptomatic varicose veins of right lower extremity: Secondary | ICD-10-CM | POA: Diagnosis not present

## 2023-04-01 DIAGNOSIS — Z8719 Personal history of other diseases of the digestive system: Secondary | ICD-10-CM

## 2023-04-01 DIAGNOSIS — E7800 Pure hypercholesterolemia, unspecified: Secondary | ICD-10-CM | POA: Diagnosis not present

## 2023-04-01 DIAGNOSIS — I4819 Other persistent atrial fibrillation: Secondary | ICD-10-CM | POA: Diagnosis not present

## 2023-04-01 DIAGNOSIS — Z86010 Personal history of colonic polyps: Secondary | ICD-10-CM

## 2023-04-01 DIAGNOSIS — D6869 Other thrombophilia: Secondary | ICD-10-CM

## 2023-04-01 DIAGNOSIS — Z8042 Family history of malignant neoplasm of prostate: Secondary | ICD-10-CM

## 2023-04-01 DIAGNOSIS — Z8619 Personal history of other infectious and parasitic diseases: Secondary | ICD-10-CM

## 2023-04-01 LAB — LIPID PANEL
Cholesterol, Total: 188 mg/dL (ref 100–199)
Triglycerides: 128 mg/dL (ref 0–149)

## 2023-04-01 LAB — COMPREHENSIVE METABOLIC PANEL
Albumin: 4.3 g/dL (ref 3.8–4.8)
BUN/Creatinine Ratio: 14 (ref 10–24)
Bilirubin Total: 0.9 mg/dL (ref 0.0–1.2)
Calcium: 9.7 mg/dL (ref 8.6–10.2)
Sodium: 141 mmol/L (ref 134–144)
eGFR: 90 mL/min/{1.73_m2} (ref >59)

## 2023-04-01 MED ORDER — ATORVASTATIN CALCIUM 40 MG PO TABS: 40.0000 mg | ORAL_TABLET | Freq: Every day | ORAL | 3 refills | Status: DC

## 2023-04-01 MED ORDER — OMEPRAZOLE 40 MG PO CPDR
40.0000 mg | DELAYED_RELEASE_CAPSULE | Freq: Every day | ORAL | 3 refills | Status: DC | PRN
Start: 2023-04-01 — End: 2024-03-31

## 2023-04-01 MED ORDER — FINASTERIDE 5 MG PO TABS: 5.0000 mg | ORAL_TABLET | Freq: Every day | ORAL | 3 refills | Status: DC

## 2023-04-01 NOTE — Progress Notes (Signed)
Maurice [name] is a 72 y.o. male who presents for annual wellness visit,CPE and follow-up on chronic medical conditions.  He has had continued difficulty with left shoulder pain and is being followed by Dr. Rennis [name] who has scheduled him for shoulder replacement in the near future.  He does have underlying atrial fibrillation and is using a loop recorder.  He continues on Xarelto, Tikosyn, Cardizem and seem to be doing well on it.  He has no difficulty from atorvastatin.  Does use finasteride regularly.  Does have a family history of prostate cancer with his brother having cancer.  He had a colonoscopy in 2019 and should be scheduled in the near future with another colonoscopy.  He does have a previous history of diverticulitis.  He uses Prilosec intermittently for his reflux symptoms and does note that certain foods do cause some difficulties.  He also has a lesion present on his right thigh that he would like removed.  He rarely has herpetic outbreaks.  His work and home life are going well.  Otherwise his family and social history as well as health maintenance and immunizations was reviewed   Immunizations and Health Maintenance Immunization History  Administered Date(s) Administered   Fluad Quad(high Dose 65+) 07/15/2019, 08/02/2021   Hepatitis A 02/15/2009   IPV 10/14/1962   Influenza Split 07/23/2013, 07/22/2015   Influenza, High Dose Seasonal PF 07/25/2017, 09/25/2018, 07/30/2022   Influenza-Unspecified 07/28/2014, 07/26/2016, 07/16/2021   MMR 10/15/1959, 09/23/1988   Meningococcal polysaccharide vaccine (MPSV4) 07/23/2013   PFIZER(Purple Top)SARS-COV-2 Vaccination 11/18/2019, 12/09/2019, 05/08/2020, 02/22/2021   Pfizer Covid-19 Vaccine Bivalent Booster 57yrs & up 08/02/2021, 07/30/2022   Pneumococcal Conjugate-13 11/01/2016   Pneumococcal Polysaccharide-23 11/13/2017   Tdap 07/30/2007, 11/03/2016   Zoster Recombinat (Shingrix) 03/17/2018, 05/19/2018   Zoster, Live 05/18/2013   Health  Maintenance Due  Topic Date Due   COVID-19 Vaccine (7 - 2023-24 season) 09/24/2022   Medicare Annual Wellness (AWV)  03/19/2023    Last colonoscopy:2019 Last PSA: today Dentist:Blue Ophtho: Maurice [name] Exercise:Reg  Other doctors caring for patient include:Maurice [name]  Advanced Directives: Yes    Depression screen:  See questionnaire below.        04/01/2023    8:18 AM 03/18/2022   10:09 AM 03/15/2021   11:10 AM 03/07/2021    8:39 AM 03/06/2020    8:36 AM  Depression screen PHQ 2/9  Decreased Interest 0 0 0 0 0  Down, Depressed, Hopeless 0 0 0 0 0  PHQ - 2 Score 0 0 0 0 0    Fall Screen: See Questionaire below.      04/01/2023    8:18 AM 03/18/2022   10:09 AM 03/15/2021   11:10 AM 03/06/2020    8:36 AM 02/26/2019   10:27 AM  Fall Risk   Falls in the past year? 0 0 0 0 0  Number falls in past yr: 0 0 0    Injury with Fall? 0 0 1    Risk for fall due to : No Fall Risks No Fall Risks No Fall Risks    Follow up Falls evaluation completed Falls evaluation completed Falls evaluation completed      ADL screen:  See questionnaire below.  Functional Status Survey: Is the patient deaf or have difficulty hearing?: No Does the patient have difficulty seeing, even when wearing glasses/contacts?: No Does the patient have difficulty concentrating, remembering, or making decisions?: No Does the patient have difficulty walking or climbing stairs?: No Does the patient have difficulty dressing or  bathing?: No Does the patient have difficulty doing errands alone such as visiting a doctor's office or shopping?: No   Review of Systems  Constitutional: -, -unexpected weight change, -anorexia, -fatigue Allergy: -sneezing, -itching, -congestion Dermatology: denies changing moles, rash, lumps ENT: -runny nose, -ear pain, -sore throat,  Cardiology:  -chest pain, -palpitations, -orthopnea, Respiratory: -cough, -shortness of breath, -dyspnea on exertion, -wheezing,  Gastroenterology: -abdominal pain,  -nausea, -vomiting, -diarrhea, -constipation, -dysphagia Hematology: -bleeding or bruising problems Musculoskeletal: -arthralgias, -myalgias, -joint swelling, -back pain, - Ophthalmology: -vision changes,  Urology: -dysuria, -difficulty urinating,  -urinary frequency, -urgency, incontinence Neurology: -, -numbness, , -memory loss, -falls, -dizziness    PHYSICAL EXAM:  BP 128/78   Pulse (!) 59   Temp 97.8 F (36.6 C) (Oral)   Resp 16   Ht 5\' 8"  (1.727 m)   Wt 175 lb 6.4 oz (79.6 kg)   SpO2 96%   BMI 26.67 kg/m   General Appearance: Alert, cooperative, no distress, appears stated age Head: Normocephalic, without obvious abnormality, atraumatic Eyes: PERRL, conjunctiva/corneas clear, EOM's intact, Ears: Normal TM's and external ear canals Nose: Nares normal, mucosa normal, no drainage or sinus   tenderness Throat: Lips, mucosa, and tongue normal; teeth and gums normal Neck: Supple, no lymphadenopathy, thyroid:no enlargement/tenderness/nodules; no carotid bruit or JVD Lungs: Clear to auscultation bilaterally without wheezes, rales or ronchi; respirations unlabored Heart: Regular rate and rhythm, S1 and S2 normal, no murmur, rub or gallop Abdomen: Soft, non-tender, nondistended, normoactive bowel sounds, no masses, no hepatosplenomegaly Extremities: No clubbing, cyanosis or edema.  Pulses: 2+ and symmetric all extremities Skin: Skin color, texture, turgor normal, no rashes or lesions.Small spider vein on right medial thigh noted. Lymph nodes: Cervical, supraclavicular, and axillary nodes normal Neurologic: CNII-XII intact, normal strength, sensation and gait; reflexes 2+ and symmetric throughout   Psych: Normal mood, affect, hygiene and grooming  ASSESSMENT/PLAN: Routine general medical examination at a health care facility  ESSENTIAL HYPERTENSION, BENIGN  Paroxysmal atrial fibrillation (HCC)  Persistent atrial fibrillation (HCC)  Gastroesophageal reflux disease without  esophagitis  Male pattern baldness  Family history of prostate cancer  History of colonic polyps  Pure hypercholesterolemia  Secondary hypercoagulable state (HCC)  History of herpes labialis  His medications were renewed.  He will follow-up with GI about possibly having endoscopy to further evaluate his reflux symptoms. Spider vein noted on the right medial thigh.  It was hyfrecated without difficulty. He is cleared for surgery with input from cardiology especially concerning his Xarelto. Discussed PSA screening (risks/benefits), recommended at least 30 minutes of aerobic activity at least 5 days/week; . Immunization recommendations discussed.  Recommend RSV as well as getting COVID and flu shot in the fall.  Colonoscopy recommendations reviewed.   Medicare Attestation I have personally reviewed: The patient's medical and social history Their use of alcohol, tobacco or illicit drugs Their current medications and supplements The patient's functional ability including ADLs,fall risks, home safety risks, cognitive, and hearing and visual impairment Diet and physical activities Evidence for depression or mood disorders  The patient's weight, height, and BMI have been recorded in the chart.  I have made referrals, counseling, and provided education to the patient based on review of the above and I have provided the patient with a written personalized care plan for preventive services.     Sharlot Gowda, MD   04/01/2023

## 2023-04-01 NOTE — Patient Instructions (Signed)
  [name] , Thank you for taking time to come for your Medicare Wellness Visit. I appreciate your ongoing commitment to your health goals. Please review the following plan we discussed and let me know if I can assist you in the future.   These are the goals we discussed:  Goals      Track and Manage My Blood Pressure-Hypertension     Timeframe:  Long-Range Goal Priority:  Medium Start Date:                             Expected End Date:                       Follow Up Date 06/29/22    - check blood pressure weekly - choose a place to take my blood pressure (home, clinic or office, retail store) - write blood pressure results in a log or diary    Why is this important?   You won't feel high blood pressure, but it can still hurt your blood vessels.  High blood pressure can cause heart or kidney problems. It can also cause a stroke.  Making lifestyle changes like losing a little weight or eating less salt will help.  Checking your blood pressure at home and at different times of the day can help to control blood pressure.  If the doctor prescribes medicine remember to take it the way the doctor ordered.  Call the office if you cannot afford the medicine or if there are questions about it.     Notes:         This is a list of the screening recommended for you and due dates:  Health Maintenance  Topic Date Due   COVID-19 Vaccine (7 - 2023-24 season) 09/24/2022   Flu Shot  05/15/2023   Medicare Annual Wellness Visit  03/31/2024   DTaP/Tdap/Td vaccine (3 - Td or Tdap) 11/03/2026   Colon Cancer Screening  07/06/2028   Pneumonia Vaccine  Completed   Hepatitis C Screening  Completed   Zoster (Shingles) Vaccine  Completed   HPV Vaccine  Aged Out

## 2023-04-02 LAB — LIPID PANEL
Chol/HDL Ratio: 3 ratio (ref 0.0–5.0)
HDL: 62 mg/dL (ref >39)
VLDL Cholesterol Cal: 22 mg/dL (ref 5–40)

## 2023-04-02 LAB — CBC WITH DIFFERENTIAL/PLATELET
Basophils Absolute: 0 10*3/uL (ref 0.0–0.2)
Basos: 0 %
EOS (ABSOLUTE): 0.1 10*3/uL (ref 0.0–0.4)
Eos: 1 %
Hematocrit: 47.1 % (ref 37.5–51.0)
Hemoglobin: 16.2 g/dL (ref 13.0–17.7)
Immature Grans (Abs): 0 10*3/uL (ref 0.0–0.1)
Immature Granulocytes: 0 %
Lymphocytes Absolute: 2.5 10*3/uL (ref 0.7–3.1)
Lymphs: 45 %
MCH: 30.5 pg (ref 26.6–33.0)
MCHC: 34.4 g/dL (ref 31.5–35.7)
MCV: 89 fL (ref 79–97)
Monocytes Absolute: 0.6 10*3/uL (ref 0.1–0.9)
Monocytes: 10 %
Neutrophils Absolute: 2.5 10*3/uL (ref 1.4–7.0)
Neutrophils: 44 %
Platelets: 219 10*3/uL (ref 150–450)
RBC: 5.32 x10E6/uL (ref 4.14–5.80)
RDW: 12.9 % (ref 11.6–15.4)
WBC: 5.6 10*3/uL (ref 3.4–10.8)

## 2023-04-02 LAB — COMPREHENSIVE METABOLIC PANEL
ALT: 26 IU/L (ref 0–44)
AST: 24 IU/L (ref 0–40)
Alkaline Phosphatase: 98 IU/L (ref 44–121)
BUN: 13 mg/dL (ref 8–27)
CO2: 21 mmol/L (ref 20–29)
Chloride: 106 mmol/L (ref 96–106)
Creatinine, Ser: 0.91 mg/dL (ref 0.76–1.27)
Globulin, Total: 2.6 g/dL (ref 1.5–4.5)
Glucose: 96 mg/dL (ref 70–99)
Potassium: 4.2 mmol/L (ref 3.5–5.2)
Total Protein: 6.9 g/dL (ref 6.0–8.5)

## 2023-04-02 LAB — PSA: Prostate Specific Ag, Serum: 0.2 ng/mL (ref 0.0–4.0)

## 2023-04-02 MED ORDER — ROSUVASTATIN CALCIUM 40 MG PO TABS
40.0000 mg | ORAL_TABLET | Freq: Every day | ORAL | 3 refills | Status: DC
Start: 2023-04-02 — End: 2024-03-22

## 2023-04-02 NOTE — Addendum Note (Signed)
Addended by: Ronnald Nian on: 04/02/2023 04:54 PM   Modules accepted: Orders

## 2023-04-03 NOTE — Progress Notes (Signed)
Carelink Summary Report / Loop Recorder 

## 2023-04-08 LAB — CUP PACEART REMOTE DEVICE CHECK
Date Time Interrogation Session: 20240624230550
Implantable Pulse Generator Implant Date: 20201228

## 2023-04-14 ENCOUNTER — Ambulatory Visit (INDEPENDENT_AMBULATORY_CARE_PROVIDER_SITE_OTHER): Payer: Medicare Other

## 2023-04-14 DIAGNOSIS — I480 Paroxysmal atrial fibrillation: Secondary | ICD-10-CM

## 2023-04-30 NOTE — Progress Notes (Signed)
 Carelink Summary Report / Loop Recorder 

## 2023-05-02 DIAGNOSIS — R0602 Shortness of breath: Secondary | ICD-10-CM | POA: Diagnosis not present

## 2023-05-02 DIAGNOSIS — R030 Elevated blood-pressure reading, without diagnosis of hypertension: Secondary | ICD-10-CM | POA: Diagnosis not present

## 2023-05-02 DIAGNOSIS — R059 Cough, unspecified: Secondary | ICD-10-CM | POA: Diagnosis not present

## 2023-05-02 DIAGNOSIS — J209 Acute bronchitis, unspecified: Secondary | ICD-10-CM | POA: Diagnosis not present

## 2023-05-02 DIAGNOSIS — Z6829 Body mass index (BMI) 29.0-29.9, adult: Secondary | ICD-10-CM | POA: Diagnosis not present

## 2023-05-19 ENCOUNTER — Other Ambulatory Visit: Payer: Self-pay

## 2023-05-19 ENCOUNTER — Ambulatory Visit (INDEPENDENT_AMBULATORY_CARE_PROVIDER_SITE_OTHER): Payer: Medicare Other

## 2023-05-19 ENCOUNTER — Telehealth: Payer: Self-pay | Admitting: Student

## 2023-05-19 ENCOUNTER — Ambulatory Visit: Payer: Medicare Other | Attending: Internal Medicine | Admitting: Student

## 2023-05-19 DIAGNOSIS — Z01810 Encounter for preprocedural cardiovascular examination: Secondary | ICD-10-CM

## 2023-05-19 DIAGNOSIS — I480 Paroxysmal atrial fibrillation: Secondary | ICD-10-CM | POA: Diagnosis not present

## 2023-05-19 MED ORDER — DOFETILIDE 250 MCG PO CAPS: 250.0000 ug | ORAL_CAPSULE | Freq: Two times a day (BID) | ORAL | 2 refills | Status: DC

## 2023-05-19 NOTE — Telephone Encounter (Signed)
Per Aida Raider, pt is asked to hold Xarelto X's 3 days prior to surgery.

## 2023-05-19 NOTE — Progress Notes (Signed)
Virtual Visit via Telephone Note   Because of Maurice [name]'s co-morbid illnesses, he is at least at moderate risk for complications without adequate follow up.  This format is felt to be most appropriate for this patient at this time.  The patient did not have access to video technology/had technical difficulties with video requiring transitioning to audio format only (telephone).  All issues noted in this document were discussed and addressed.  No physical exam could be performed with this format.  Please refer to the patient's chart for his consent to telehealth for [facility].  Evaluation Performed:  Preoperative cardiovascular risk assessment _____________   Date:  05/19/2023   Patient ID:  Maurice [name], DOB [birth year], MRN [id number] Patient Location:  Home Provider location:   Office  Primary Care Provider:  Ronnald Nian, MD Primary Cardiologist:  Dr. Nelly Laurence   Chief Complaint / Patient Profile   72 y.o. y/o male with a h/o nonobstructive CAD per CTA, PAF s/p multiple afib ablations on anticoagulation, hypertension, hyperlipidemia, GERD who is pending right reverse shoulder arthroplasty by Dr. Rennis Chris and presents today for telephonic preoperative cardiovascular risk assessment.  History of Present Illness    Maurice [name] is a 72 y.o. male who presents via audio/video conferencing for a telehealth visit today.  Pt was last seen in cardiology clinic on 01/28/2023 by Dr. Nelly Laurence.  At that time Maurice [name] was stable from a cardiac perspective.  The patient is now pending procedure as outlined above. Since his last visit, he is doing well. Patient denies shortness of breath or dyspnea on exertion. No chest pain, pressure, or tightness. Denies lower extremity edema, orthopnea, or PND. He has occasional palpitations that are unchanged for years. He is very active attending spin class 4 days a week.   Past Medical History    Past Medical History:   Diagnosis Date   Atrial fibrillation (HCC)    paroxysmal s/p PVI by Maurice [name], Maurice [name], Maurice [name]   Atypical mole 09/09/Maurice [name]   mild - r chest   Atypical mole 08/23/2014   moderate - l paraspinal   Atypical mole 06/13/Maurice [name]   mild- R lower back (recheck evert )   Basal cell carcinoma of skin 07/16/2018   Nod-R Nare (MOHS)   Diverticulosis    Colonoscopy 10/Maurice [name]   Herpes    History of hepatitis B    Hypercholesterolemia    Squamous cell carcinoma of skin of right thumb 08/28/2014   insitu - R Thumb - currett, cautery   Varicose veins    Past Surgical History:  Procedure Laterality Date   atrail fibrillation ablation  7/10, 1//17/14   PVI by Maurice   ATRIAL FIBRILLATION ABLATION N/A 1/16/Maurice [name]   Procedure: ATRIAL FIBRILLATION ABLATION;  Surgeon: Hillis Range, MD;  Location: [facility] CATH LAB;  Service: Cardiovascular;  Laterality: N/A;   ATRIAL FIBRILLATION ABLATION N/A 03/24/2020   Procedure: ATRIAL FIBRILLATION ABLATION;  Surgeon: Hillis Range, MD;  Location: [facility] INVASIVE CV LAB;  Service: Cardiovascular;  Laterality: N/A;   CARDIOVERSION N/A 09/20/2015   Procedure: CARDIOVERSION;  Surgeon: Vesta Mixer, MD;  Location[facility] ENDOSCOPY;  Service: Cardiovascular;  Laterality: N/A;   CARDIOVERSION N/A 09/28/2015   Procedure: CARDIOVERSION;  Surgeon: Lewayne Bunting, MD;  Location: [facility] ENDOSCOPY;  Service: Cardiovascular;  Laterality: N/A;   CARDIOVERSION N/A 07/11/2021   Procedure: CARDIOVERSION;  Surgeon: Sande Rives, MD;  Location: [facility] ENDOSCOPY;  Service: Cardiovascular;  Laterality: N/A;   COLONOSCOPY  2004  ELECTROPHYSIOLOGIC STUDY N/A 2/14/Maurice [name]   3rd AF ablation by Dr Johney Frame   implantable loop recorder placement  10/11/2019   Medtronic Reveal [place] model LNQ22 (SN [id number]) by Dr Johney Frame for afib management post ablation   SKIN BIOPSY Right 11/03/2018   TEE WITHOUT CARDIOVERSION  1/16/Maurice [name]   Procedure: TRANSESOPHAGEAL ECHOCARDIOGRAM (TEE);  Surgeon: Pricilla Riffle, MD;  Location: [facility]  ENDOSCOPY;  Service: Cardiovascular;  Laterality: N/A;  pre ablation   TEE WITHOUT CARDIOVERSION N/A 09/20/2015   Procedure: TRANSESOPHAGEAL ECHOCARDIOGRAM (TEE);  Surgeon: Vesta Mixer, MD;  Location: [facility] ENDOSCOPY;  Service: Cardiovascular;  Laterality: N/A;   TEE WITHOUT CARDIOVERSION N/A 07/11/2021   Procedure: TRANSESOPHAGEAL ECHOCARDIOGRAM (TEE);  Surgeon: Sande Rives, MD;  Location: [facility] ENDOSCOPY;  Service: Cardiovascular;  Laterality: N/A;   TONSILLECTOMY     when child    Allergies  No Known Allergies  Home Medications    Prior to Admission medications   Medication Sig Start Date End Date Taking? Authorizing Provider  acyclovir (ZOVIRAX) 400 MG tablet TAKE 1 TABLET BY MOUTH THREE TIMES DAILY AS NEEDED AS DIRECTED 06/19/22   Ronnald Nian, MD  diltiazem (CARDIZEM CD) 180 MG 24 hr capsule TAKE 1 CAPSULE(180 MG) BY MOUTH DAILY 07/24/22   Graciella Freer, PA-C  dofetilide (TIKOSYN) 250 MCG capsule Take 1 capsule (250 mcg total) by mouth 2 (two) times daily. 11/25/22   Newman Nip, NP  finasteride (PROSCAR) 5 MG tablet Take 1 tablet (5 mg total) by mouth daily. 04/01/23   Ronnald Nian, MD  Multiple Vitamin (MULTI VITAMIN PO) Take 1 tablet by mouth daily.     [provider]  omeprazole (PRILOSEC) 40 MG capsule Take 1 capsule (40 mg total) by mouth daily as needed (heartburn or acid reflux). 04/01/23   Ronnald Nian, MD  potassium chloride (KLOR-CON) 10 MEQ tablet Take 1 tablet (10 mEq total) by mouth daily. 07/26/22 04/01/23  Newman Nip, NP  rosuvastatin (CRESTOR) 40 MG tablet Take 1 tablet (40 mg total) by mouth daily. 04/02/23   Ronnald Nian, MD  XARELTO 20 MG TABS tablet TAKE 1 TABLET(20 MG) BY MOUTH DAILY WITH SUPPER 01/08/23   Mealor, Roberts Gaudy, MD    Physical Exam    Vital Signs:  Maurice [name] does not have vital signs available for review today.  Given telephonic nature of communication, physical exam is limited. AAOx3. NAD.  Normal affect.  Speech and respirations are unlabored.  Accessory Clinical Findings    None  Assessment & Plan    Primary Cardiologist: Dr. Nelly Laurence  Preoperative cardiovascular risk assessment. Right reverse shoulder arthroplasty by Dr.Supple on 06/06/2023.  Chart reviewed as part of pre-operative protocol coverage. According to the RCRI, patient has a 0.4% risk of MACE. Patient reports activity equivalent to >4.0 METS (Spin class 4 days a week).   Given past medical history and time since last visit, based on ACC/AHA guidelines, Maurice [name] would be at acceptable risk for the planned procedure without further cardiovascular testing.   Patient was advised that if he develops new symptoms prior to surgery to contact our office to arrange a follow-up appointment.  he verbalized understanding.  Per pharm D, patient may hold Xarelto 3 days prior to procedure.   I will route this recommendation to the requesting party via Epic fax function.  Please call with questions.  Time:   Today, I have spent 5 minutes with the patient with telehealth technology discussing medical  history, symptoms, and management plan.     Maurice Levering, NP  05/19/2023, 7:26 AM

## 2023-05-19 NOTE — Telephone Encounter (Signed)
Please advise holding Xarelto prior to right reverse shoulder arthroplasty on 06/06/2023.  Thank you!  DW

## 2023-05-19 NOTE — Telephone Encounter (Signed)
Patient with diagnosis of afib on Xarelto for anticoagulation.    Procedure: Right Reverse Shoulder Arthroplasty  Date of procedure: 06/06/23   CHA2DS2-VASc Score = 2   This indicates a 2.2% annual risk of stroke. The patient's score is based upon: CHF History: 0 HTN History: 1 Diabetes History: 0 Stroke History: 0 Vascular Disease History: 0 Age Score: 1 Gender Score: 0      CrCl 77 ml/min  Per office protocol, patient can hold Xarelto for 3 days prior to procedure.    **This guidance is not considered finalized until pre-operative APP has relayed final recommendations.**

## 2023-05-19 NOTE — Telephone Encounter (Signed)
This patient is scheduled for a virtual visit today. He is on Xarelto prescribed by [place]. Will you please contact the requesting office to verify if they would like Xarelto held?   Thank you!  DW    Patient Name: Maurice [name]  DOB: 03-25-1951 MRN: [id number]        Request for Surgical Clearance     Procedure:   Right Reverse Shoulder Arthroplasty   Date of Surgery:  Clearance 06/06/23                                 Surgeon:  dr. Francena Hanly Surgeon's Group or Practice Name:  [facility] Phone number:  [phone] Fax number:  [phone] ATTN: Aida Raider   Type of Clearance Requested:   - Medical    Type of Anesthesia:  General    Additional requests/questions:     SignedZada Finders   03/21/2023, 11:27 AM

## 2023-05-27 DIAGNOSIS — K080 Exfoliation of teeth due to systemic causes: Secondary | ICD-10-CM | POA: Diagnosis not present

## 2023-05-30 NOTE — Progress Notes (Signed)
Carelink Summary Report / Loop Recorder 

## 2023-06-06 DIAGNOSIS — G8918 Other acute postprocedural pain: Secondary | ICD-10-CM | POA: Diagnosis not present

## 2023-06-06 DIAGNOSIS — M19011 Primary osteoarthritis, right shoulder: Secondary | ICD-10-CM | POA: Diagnosis not present

## 2023-06-06 DIAGNOSIS — Z471 Aftercare following joint replacement surgery: Secondary | ICD-10-CM | POA: Diagnosis not present

## 2023-06-06 DIAGNOSIS — M25511 Pain in right shoulder: Secondary | ICD-10-CM | POA: Diagnosis not present

## 2023-06-06 DIAGNOSIS — M75111 Incomplete rotator cuff tear or rupture of right shoulder, not specified as traumatic: Secondary | ICD-10-CM | POA: Diagnosis not present

## 2023-06-06 DIAGNOSIS — I890 Lymphedema, not elsewhere classified: Secondary | ICD-10-CM | POA: Diagnosis not present

## 2023-06-06 DIAGNOSIS — Z96611 Presence of right artificial shoulder joint: Secondary | ICD-10-CM | POA: Diagnosis not present

## 2023-06-23 ENCOUNTER — Ambulatory Visit (INDEPENDENT_AMBULATORY_CARE_PROVIDER_SITE_OTHER): Payer: Medicare Other

## 2023-06-23 DIAGNOSIS — I480 Paroxysmal atrial fibrillation: Secondary | ICD-10-CM

## 2023-06-23 DIAGNOSIS — Z96611 Presence of right artificial shoulder joint: Secondary | ICD-10-CM | POA: Diagnosis not present

## 2023-06-23 LAB — CUP PACEART REMOTE DEVICE CHECK
Date Time Interrogation Session: 20240906230607
Implantable Pulse Generator Implant Date: 20201228

## 2023-06-24 DIAGNOSIS — K219 Gastro-esophageal reflux disease without esophagitis: Secondary | ICD-10-CM | POA: Diagnosis not present

## 2023-06-24 DIAGNOSIS — Z1211 Encounter for screening for malignant neoplasm of colon: Secondary | ICD-10-CM | POA: Diagnosis not present

## 2023-06-24 DIAGNOSIS — K5900 Constipation, unspecified: Secondary | ICD-10-CM | POA: Diagnosis not present

## 2023-06-24 DIAGNOSIS — R1310 Dysphagia, unspecified: Secondary | ICD-10-CM | POA: Diagnosis not present

## 2023-07-01 DIAGNOSIS — Z85828 Personal history of other malignant neoplasm of skin: Secondary | ICD-10-CM | POA: Diagnosis not present

## 2023-07-01 DIAGNOSIS — D225 Melanocytic nevi of trunk: Secondary | ICD-10-CM | POA: Diagnosis not present

## 2023-07-01 DIAGNOSIS — L820 Inflamed seborrheic keratosis: Secondary | ICD-10-CM | POA: Diagnosis not present

## 2023-07-01 DIAGNOSIS — L570 Actinic keratosis: Secondary | ICD-10-CM | POA: Diagnosis not present

## 2023-07-01 DIAGNOSIS — L814 Other melanin hyperpigmentation: Secondary | ICD-10-CM | POA: Diagnosis not present

## 2023-07-01 DIAGNOSIS — L821 Other seborrheic keratosis: Secondary | ICD-10-CM | POA: Diagnosis not present

## 2023-07-03 NOTE — Progress Notes (Signed)
Carelink Summary Report / Loop Recorder 

## 2023-07-07 DIAGNOSIS — M25611 Stiffness of right shoulder, not elsewhere classified: Secondary | ICD-10-CM | POA: Diagnosis not present

## 2023-07-07 DIAGNOSIS — M25511 Pain in right shoulder: Secondary | ICD-10-CM | POA: Diagnosis not present

## 2023-07-15 DIAGNOSIS — M25511 Pain in right shoulder: Secondary | ICD-10-CM | POA: Diagnosis not present

## 2023-07-15 DIAGNOSIS — M25611 Stiffness of right shoulder, not elsewhere classified: Secondary | ICD-10-CM | POA: Diagnosis not present

## 2023-07-19 ENCOUNTER — Other Ambulatory Visit: Payer: Self-pay | Admitting: Student

## 2023-07-21 ENCOUNTER — Other Ambulatory Visit: Payer: Self-pay

## 2023-07-21 MED ORDER — POTASSIUM CHLORIDE ER 10 MEQ PO TBCR: 10.0000 meq | EXTENDED_RELEASE_TABLET | Freq: Every day | ORAL | 1 refills | Status: DC

## 2023-07-22 DIAGNOSIS — M25611 Stiffness of right shoulder, not elsewhere classified: Secondary | ICD-10-CM | POA: Diagnosis not present

## 2023-07-22 DIAGNOSIS — M25511 Pain in right shoulder: Secondary | ICD-10-CM | POA: Diagnosis not present

## 2023-07-23 ENCOUNTER — Telehealth: Payer: Self-pay | Admitting: Cardiovascular Disease

## 2023-07-23 ENCOUNTER — Telehealth: Payer: Self-pay | Admitting: *Deleted

## 2023-07-23 MED ORDER — DILTIAZEM HCL ER COATED BEADS 180 MG PO CP24: ORAL_CAPSULE | ORAL | 1 refills | Status: DC

## 2023-07-23 NOTE — Telephone Encounter (Signed)
Pt's medication was sent to pt's pharmacy as requested. Confirmation received.  °

## 2023-07-23 NOTE — Telephone Encounter (Signed)
   Pre-operative Risk Assessment    Patient Name: Maurice [name]  DOB: 02-27-1951 MRN: [id number]    DATE OF LAST VISIT: 05/19/23 TELE VISIT Carlos Levering, NP; 01/28/23 DR. MEALOR DATE OF NEXT VISIT: 08/26/23 DR. [facility]  Request for Surgical Clearance    Procedure:   EGD AND COLONOSCOPY  Date of Surgery:  Clearance 08/06/23                                 Surgeon:  DR. MANN Surgeon's Group or Practice Name:  [facility] Phone number:  [phone] Fax number:  [phone]   Type of Clearance Requested:   - Medical  - Pharmacy:  Hold Rivaroxaban (Xarelto)     Type of Anesthesia:   PROPOFOL   Additional requests/questions:    Elpidio Anis   07/23/2023, 12:56 PM

## 2023-07-23 NOTE — Telephone Encounter (Signed)
Patient with diagnosis of afib on Xarelto for anticoagulation.    Procedure: EGD AND COLONOSCOPY  Date of procedure: 08/06/23   CHA2DS2-VASc Score = 2   This indicates a 2.2% annual risk of stroke. The patient's score is based upon: CHF History: 0 HTN History: 1 Diabetes History: 0 Stroke History: 0 Vascular Disease History: 0 Age Score: 1 Gender Score: 0      CrCl 70.99 ml/min Platelet count 219  Per office protocol, patient can hold Xarelto for 2 days prior to procedure.    **This guidance is not considered finalized until pre-operative APP has relayed final recommendations.**

## 2023-07-23 NOTE — Telephone Encounter (Signed)
*  STAT* If patient is at the pharmacy, call can be transferred to refill team.   1. Which medications need to be refilled? (please list name of each medication and dose if known)  diltiazem (CARDIZEM CD) 180 MG 24 hr capsule  2. Which pharmacy/location (including street and city if local pharmacy) is medication to be sent to? [id number] - [place], [place] - [address] AT [facility] [address]  3. Do they need a 30 day or 90 day supply?   90 day supply

## 2023-07-23 NOTE — Telephone Encounter (Signed)
   Name: Maurice [name]  DOB: [birth year]  MRN: [id number]  Primary Cardiologist: None   Preoperative team, please contact this patient and set up a phone call appointment for further preoperative risk assessment. Please obtain consent and complete medication review. Thank you for your help.  I confirm that guidance regarding antiplatelet and oral anticoagulation therapy has been completed and, if necessary, noted below.   Per office protocol, patient can hold Xarelto for 2 days prior to procedure.  Please resume Xarelto as soon as possible postprocedure, at the discretion of the surgeon.    I also confirmed the patient resides in the state of [place]. As per [facility] Medical Board telemedicine laws, the patient must reside in the state in which the provider is licensed.   Joylene Grapes, NP 07/23/2023, 5:02 PM [facility]

## 2023-07-24 ENCOUNTER — Telehealth: Payer: Self-pay | Admitting: *Deleted

## 2023-07-24 DIAGNOSIS — M25511 Pain in right shoulder: Secondary | ICD-10-CM | POA: Diagnosis not present

## 2023-07-24 DIAGNOSIS — M25611 Stiffness of right shoulder, not elsewhere classified: Secondary | ICD-10-CM | POA: Diagnosis not present

## 2023-07-24 NOTE — Telephone Encounter (Signed)
Patient returned Pre-op call regarding tele visit.

## 2023-07-24 NOTE — Telephone Encounter (Signed)
Left message to call back to set up tele pre op appt.  

## 2023-07-24 NOTE — Telephone Encounter (Signed)
Pt has been scheduled tele pre op appt 08/01/23. Ok per Bernadene Person, NP to use provider slot due to no open slots and med hold and procedure date.   Med rec and consent are done.     Patient Consent for Virtual Visit        Maurice [name] has provided verbal consent on 07/24/2023 for a virtual visit (video or telephone).   CONSENT FOR VIRTUAL VISIT FOR:  Maurice [name]  By participating in this virtual visit I agree to the following:  I hereby voluntarily request, consent and authorize [facility] and its employed or contracted physicians, physician assistants, nurse practitioners or other licensed health care professionals (the Practitioner), to provide me with telemedicine health care services (the "Services") as deemed necessary by the treating Practitioner. I acknowledge and consent to receive the Services by the Practitioner via telemedicine. I understand that the telemedicine visit will involve communicating with the Practitioner through live audiovisual communication technology and the disclosure of certain medical information by electronic transmission. I acknowledge that I have been given the opportunity to request an in-person assessment or other available alternative prior to the telemedicine visit and am voluntarily participating in the telemedicine visit.  I understand that I have the right to withhold or withdraw my consent to the use of telemedicine in the course of my care at any time, without affecting my right to future care or treatment, and that the Practitioner or I may terminate the telemedicine visit at any time. I understand that I have the right to inspect all information obtained and/or recorded in the course of the telemedicine visit and may receive copies of available information for a reasonable fee.  I understand that some of the potential risks of receiving the Services via telemedicine include:  Delay or interruption in medical evaluation due to  technological equipment failure or disruption; Information transmitted may not be sufficient (e.g. poor resolution of images) to allow for appropriate medical decision making by the Practitioner; and/or  In rare instances, security protocols could fail, causing a breach of personal health information.  Furthermore, I acknowledge that it is my responsibility to provide information about my medical history, conditions and care that is complete and accurate to the best of my ability. I acknowledge that Practitioner's advice, recommendations, and/or decision may be based on factors not within their control, such as incomplete or inaccurate data provided by me or distortions of diagnostic images or specimens that may result from electronic transmissions. I understand that the practice of medicine is not an exact science and that Practitioner makes no warranties or guarantees regarding treatment outcomes. I acknowledge that a copy of this consent can be made available to me via my patient portal [facility] MyChart), or I can request a printed copy by calling the office of [facility].    I understand that my insurance will be billed for this visit.   I have read or had this consent read to me. I understand the contents of this consent, which adequately explains the benefits and risks of the Services being provided via telemedicine.  I have been provided ample opportunity to ask questions regarding this consent and the Services and have had my questions answered to my satisfaction. I give my informed consent for the services to be provided through the use of telemedicine in my medical care

## 2023-07-24 NOTE — Telephone Encounter (Signed)
Ok per pre op APP Bernadene Person, NP to add on due to no open slots and procedure date and med hold.

## 2023-07-24 NOTE — Telephone Encounter (Signed)
Pt has been scheduled tele pre op appt 08/01/23. Ok per Bernadene Person, NP to use provider slot due to no open slots and med hold and procedure date.    Med rec and consent are done.

## 2023-07-25 ENCOUNTER — Encounter: Payer: Medicare Other | Admitting: Cardiovascular Disease

## 2023-07-28 ENCOUNTER — Ambulatory Visit (INDEPENDENT_AMBULATORY_CARE_PROVIDER_SITE_OTHER): Payer: Medicare Other

## 2023-07-28 DIAGNOSIS — I480 Paroxysmal atrial fibrillation: Secondary | ICD-10-CM

## 2023-07-28 DIAGNOSIS — M25511 Pain in right shoulder: Secondary | ICD-10-CM | POA: Diagnosis not present

## 2023-07-28 DIAGNOSIS — M25611 Stiffness of right shoulder, not elsewhere classified: Secondary | ICD-10-CM | POA: Diagnosis not present

## 2023-07-28 LAB — CUP PACEART REMOTE DEVICE CHECK
Date Time Interrogation Session: 20241013231337
Implantable Pulse Generator Implant Date: 20201228

## 2023-07-31 DIAGNOSIS — M25511 Pain in right shoulder: Secondary | ICD-10-CM | POA: Diagnosis not present

## 2023-07-31 DIAGNOSIS — M25611 Stiffness of right shoulder, not elsewhere classified: Secondary | ICD-10-CM | POA: Diagnosis not present

## 2023-08-01 ENCOUNTER — Ambulatory Visit: Payer: Medicare Other | Attending: Physician Assistant

## 2023-08-01 DIAGNOSIS — Z01810 Encounter for preprocedural cardiovascular examination: Secondary | ICD-10-CM

## 2023-08-01 NOTE — Progress Notes (Signed)
Virtual Visit via Telephone Note   Because of Maurice [name]'s co-morbid illnesses, he is at least at moderate risk for complications without adequate follow up.  This format is felt to be most appropriate for this patient at this time.  The patient did not have access to video technology/had technical difficulties with video requiring transitioning to audio format only (telephone).  All issues noted in this document were discussed and addressed.  No physical exam could be performed with this format.  Please refer to the patient's chart for his consent to telehealth for [facility].  Evaluation Performed:  Preoperative cardiovascular risk assessment _____________   Date:  08/01/2023   Patient ID:  Maurice [name], DOB 1950/12/02, MRN [id number] Patient Location:  Home Provider location:   Office  Primary Care Provider:  Ronnald Nian, MD Primary Cardiologist:  None  Chief Complaint / Patient Profile   72 y.o. y/o male with a h/o nonobstructive CAD per CTA, PAF status post multiple A-fib ablations on anticoagulation, hypertension, hyperlipidemia, and GERD who is pending EGD and colonoscopy and presents today for telephonic preoperative cardiovascular risk assessment.  History of Present Illness    Maurice [name] is a 72 y.o. male who presents via audio/video conferencing for a telehealth visit today.  Pt was last seen in cardiology clinic on 01/28/2023 by Dr. Nelly Laurence.  At that time DAEMION SWIMMER was doing well other than palpitations.  The patient is now pending procedure as outlined above. Since his last visit, he tells me that he has been doing really well from a cardiac standpoint.  He states that he is then changed again for him and now his A-fib is at below 1% of the time on his loop recorder.  Feels much better.  He recently had a total shoulder done and they are clearing him next week to go back to his spin classes.  He usually takes a spin class III-IV  days a week.  He also enjoys yoga as well.  He is very active and scores above a 4 METS on the DASI.  Per office protocol, patient can hold Xarelto for 2 days prior to procedure.  Please resume Xarelto as soon as possible postprocedure, at the discretion of the surgeon   Past Medical History    Past Medical History:  Diagnosis Date   Atrial fibrillation (HCC)    paroxysmal s/p PVI by JA 2010, 2014, 2017   Atypical mole 06/22/2013   mild - r chest   Atypical mole 08/23/2014   moderate - l paraspinal   Atypical mole 03/26/2016   mild- R lower back (recheck evert )   Basal cell carcinoma of skin 07/16/2018   Nod-R Nare (MOHS)   Diverticulosis    Colonoscopy 07/2013   Herpes    History of hepatitis B    Hypercholesterolemia    Squamous cell carcinoma of skin of right thumb 08/28/2014   insitu - R Thumb - currett, cautery   Varicose veins    Past Surgical History:  Procedure Laterality Date   atrail fibrillation ablation  7/10, 1//17/14   PVI by JA   ATRIAL FIBRILLATION ABLATION N/A 10/29/2012   Procedure: ATRIAL FIBRILLATION ABLATION;  Surgeon: Hillis Range, MD;  Location: [facility] CATH LAB;  Service: Cardiovascular;  Laterality: N/A;   ATRIAL FIBRILLATION ABLATION N/A 03/24/2020   Procedure: ATRIAL FIBRILLATION ABLATION;  Surgeon: Hillis Range, MD;  Location: [facility] INVASIVE CV LAB;  Service: Cardiovascular;  Laterality: N/A;   CARDIOVERSION  N/A 09/20/2015   Procedure: CARDIOVERSION;  Surgeon: Vesta Mixer, MD;  Location: [facility] ENDOSCOPY;  Service: Cardiovascular;  Laterality: N/A;   CARDIOVERSION N/A 09/28/2015   Procedure: CARDIOVERSION;  Surgeon: Lewayne Bunting, MD;  Location: [facility] ENDOSCOPY;  Service: Cardiovascular;  Laterality: N/A;   CARDIOVERSION N/A 07/11/2021   Procedure: CARDIOVERSION;  Surgeon: Sande Rives, MD;  Location: [facility] ENDOSCOPY;  Service: Cardiovascular;  Laterality: N/A;   COLONOSCOPY  2004   ELECTROPHYSIOLOGIC STUDY N/A 11/28/2015   3rd AF ablation by Dr  Johney Frame   implantable loop recorder placement  10/11/2019   Medtronic Reveal [place] model LNQ22 (SN [id number]) by Dr Johney Frame for afib management post ablation   SKIN BIOPSY Right 11/03/2018   TEE WITHOUT CARDIOVERSION  10/29/2012   Procedure: TRANSESOPHAGEAL ECHOCARDIOGRAM (TEE);  Surgeon: Pricilla Riffle, MD;  Location: [facility] ENDOSCOPY;  Service: Cardiovascular;  Laterality: N/A;  pre ablation   TEE WITHOUT CARDIOVERSION N/A 09/20/2015   Procedure: TRANSESOPHAGEAL ECHOCARDIOGRAM (TEE);  Surgeon: Vesta Mixer, MD;  Location: [facility] ENDOSCOPY;  Service: Cardiovascular;  Laterality: N/A;   TEE WITHOUT CARDIOVERSION N/A 07/11/2021   Procedure: TRANSESOPHAGEAL ECHOCARDIOGRAM (TEE);  Surgeon: Sande Rives, MD;  Location: [facility] ENDOSCOPY;  Service: Cardiovascular;  Laterality: N/A;   TONSILLECTOMY     when child    Allergies  No Known Allergies  Home Medications    Prior to Admission medications   Medication Sig Start Date End Date Taking? Authorizing Provider  acyclovir (ZOVIRAX) 400 MG tablet TAKE 1 TABLET BY MOUTH THREE TIMES DAILY AS NEEDED AS DIRECTED 06/19/22   Ronnald Nian, MD  diltiazem (CARDIZEM CD) 180 MG 24 hr capsule TAKE 1 CAPSULE(180 MG) BY MOUTH DAILY 07/23/23   Mealor, Roberts Gaudy, MD  dofetilide (TIKOSYN) 250 MCG capsule Take 1 capsule (250 mcg total) by mouth 2 (two) times daily. 05/19/23   Mealor, Roberts Gaudy, MD  finasteride (PROSCAR) 5 MG tablet Take 1 tablet (5 mg total) by mouth daily. 04/01/23   Ronnald Nian, MD  Multiple Vitamin (MULTI VITAMIN PO) Take 1 tablet by mouth daily.     [provider]  omeprazole (PRILOSEC) 40 MG capsule Take 1 capsule (40 mg total) by mouth daily as needed (heartburn or acid reflux). 04/01/23   Ronnald Nian, MD  potassium chloride (KLOR-CON) 10 MEQ tablet Take 1 tablet (10 mEq total) by mouth daily. 07/21/23 10/19/23  Mealor, Roberts Gaudy, MD  rosuvastatin (CRESTOR) 40 MG tablet Take 1 tablet (40 mg total) by mouth daily. 04/02/23   Ronnald Nian, MD  XARELTO 20 MG TABS tablet TAKE 1 TABLET(20 MG) BY MOUTH DAILY WITH SUPPER 01/08/23   Mealor, Roberts Gaudy, MD    Physical Exam    Vital Signs:  SHALEV RIDENBAUGH does not have vital signs available for review today.  Given telephonic nature of communication, physical exam is limited. AAOx3. NAD. Normal affect.  Speech and respirations are unlabored.  Accessory Clinical Findings    None  Assessment & Plan    1.  Preoperative Cardiovascular Risk Assessment:  Mr. Mehler perioperative risk of a major cardiac event is 0.9% according to the Revised Cardiac Risk Index (RCRI).  Therefore, he is at low risk for perioperative complications.   His functional capacity is excellent at 7.53 METs according to the Duke Activity Status Index (DASI). Recommendations: According to ACC/AHA guidelines, no further cardiovascular testing needed.  The patient may proceed to surgery at acceptable risk.   Antiplatelet and/or Anticoagulation Recommendations:  Xarelto (Rivaroxaban) can be held for 2 days prior to surgery.  Please resume post op when felt to be safe.     The patient was advised that if he develops new symptoms prior to surgery to contact our office to arrange for a follow-up visit, and he verbalized understanding.   A copy of this note will be routed to requesting surgeon.  Time:   Today, I have spent 7 minutes with the patient with telehealth technology discussing medical history, symptoms, and management plan.     Sharlene Dory, PA-C  08/01/2023, 12:37 PM

## 2023-08-05 DIAGNOSIS — M25611 Stiffness of right shoulder, not elsewhere classified: Secondary | ICD-10-CM | POA: Diagnosis not present

## 2023-08-05 DIAGNOSIS — M25511 Pain in right shoulder: Secondary | ICD-10-CM | POA: Diagnosis not present

## 2023-08-06 DIAGNOSIS — R1310 Dysphagia, unspecified: Secondary | ICD-10-CM | POA: Diagnosis not present

## 2023-08-06 DIAGNOSIS — K635 Polyp of colon: Secondary | ICD-10-CM | POA: Diagnosis not present

## 2023-08-06 DIAGNOSIS — K5730 Diverticulosis of large intestine without perforation or abscess without bleeding: Secondary | ICD-10-CM | POA: Diagnosis not present

## 2023-08-06 DIAGNOSIS — K208 Other esophagitis without bleeding: Secondary | ICD-10-CM | POA: Diagnosis not present

## 2023-08-06 DIAGNOSIS — K648 Other hemorrhoids: Secondary | ICD-10-CM | POA: Diagnosis not present

## 2023-08-06 DIAGNOSIS — K2289 Other specified disease of esophagus: Secondary | ICD-10-CM | POA: Diagnosis not present

## 2023-08-06 DIAGNOSIS — K2100 Gastro-esophageal reflux disease with esophagitis, without bleeding: Secondary | ICD-10-CM | POA: Diagnosis not present

## 2023-08-06 DIAGNOSIS — Z860101 Personal history of adenomatous and serrated colon polyps: Secondary | ICD-10-CM | POA: Diagnosis not present

## 2023-08-06 DIAGNOSIS — Z860100 Personal history of colon polyps, unspecified: Secondary | ICD-10-CM | POA: Diagnosis not present

## 2023-08-06 DIAGNOSIS — D125 Benign neoplasm of sigmoid colon: Secondary | ICD-10-CM | POA: Diagnosis not present

## 2023-08-06 DIAGNOSIS — Z1211 Encounter for screening for malignant neoplasm of colon: Secondary | ICD-10-CM | POA: Diagnosis not present

## 2023-08-06 LAB — HM COLONOSCOPY

## 2023-08-07 DIAGNOSIS — M25611 Stiffness of right shoulder, not elsewhere classified: Secondary | ICD-10-CM | POA: Diagnosis not present

## 2023-08-07 DIAGNOSIS — M25511 Pain in right shoulder: Secondary | ICD-10-CM | POA: Diagnosis not present

## 2023-08-11 NOTE — Progress Notes (Signed)
Carelink Summary Report / Loop Recorder 

## 2023-08-12 DIAGNOSIS — M25511 Pain in right shoulder: Secondary | ICD-10-CM | POA: Diagnosis not present

## 2023-08-12 DIAGNOSIS — M25611 Stiffness of right shoulder, not elsewhere classified: Secondary | ICD-10-CM | POA: Diagnosis not present

## 2023-08-14 DIAGNOSIS — M25611 Stiffness of right shoulder, not elsewhere classified: Secondary | ICD-10-CM | POA: Diagnosis not present

## 2023-08-14 DIAGNOSIS — M25511 Pain in right shoulder: Secondary | ICD-10-CM | POA: Diagnosis not present

## 2023-08-19 DIAGNOSIS — M25511 Pain in right shoulder: Secondary | ICD-10-CM | POA: Diagnosis not present

## 2023-08-19 DIAGNOSIS — M25611 Stiffness of right shoulder, not elsewhere classified: Secondary | ICD-10-CM | POA: Diagnosis not present

## 2023-08-21 DIAGNOSIS — M25611 Stiffness of right shoulder, not elsewhere classified: Secondary | ICD-10-CM | POA: Diagnosis not present

## 2023-08-21 DIAGNOSIS — M25511 Pain in right shoulder: Secondary | ICD-10-CM | POA: Diagnosis not present

## 2023-08-26 ENCOUNTER — Ambulatory Visit: Payer: Medicare Other | Attending: Cardiovascular Disease | Admitting: Cardiovascular Disease

## 2023-08-26 ENCOUNTER — Telehealth: Payer: Self-pay | Admitting: *Deleted

## 2023-08-26 ENCOUNTER — Encounter: Payer: Self-pay | Admitting: Cardiovascular Disease

## 2023-08-26 VITALS — BP 120/80 | HR 53 | Ht 68.0 in | Wt 180.4 lb

## 2023-08-26 DIAGNOSIS — I4819 Other persistent atrial fibrillation: Secondary | ICD-10-CM

## 2023-08-26 DIAGNOSIS — M25611 Stiffness of right shoulder, not elsewhere classified: Secondary | ICD-10-CM | POA: Diagnosis not present

## 2023-08-26 DIAGNOSIS — M25511 Pain in right shoulder: Secondary | ICD-10-CM | POA: Diagnosis not present

## 2023-08-26 DIAGNOSIS — I480 Paroxysmal atrial fibrillation: Secondary | ICD-10-CM

## 2023-08-26 MED ORDER — RIVAROXABAN 20 MG PO TABS
20.0000 mg | ORAL_TABLET | Freq: Every day | ORAL | 3 refills | Status: DC
Start: 2023-08-26 — End: 2024-08-17

## 2023-08-26 NOTE — Patient Instructions (Signed)
Medication Instructions:  Your physician recommends that you continue on your current medications as directed. Please refer to the Current Medication list given to you today. *If you need a refill on your cardiac medications before your next appointment, please call your pharmacy*   Testing/Procedures: [place] Sleep Study Your physician has recommended that you have a sleep study. This test records several body functions during sleep, including: brain activity, eye movement, oxygen and carbon dioxide blood levels, heart rate and rhythm, breathing rate and rhythm, the flow of air through your mouth and nose, snoring, body muscle movements, and chest and belly movement.   Follow-Up: At [facility], you and your health needs are our priority.  As part of our continuing mission to provide you with exceptional heart care, we have created designated Provider Care Teams.  These Care Teams include your primary Cardiologist (physician) and Advanced Practice Providers (APPs -  Physician Assistants and Nurse Practitioners) who all work together to provide you with the care you need, when you need it.  We recommend signing up for the patient portal called "MyChart".  Sign up information is provided on this After Visit Summary.  MyChart is used to connect with patients for Virtual Visits (Telemedicine).  Patients are able to view lab/test results, encounter notes, upcoming appointments, etc.  Non-urgent messages can be sent to your provider as well.   To learn more about what you can do with MyChart, go to [email or web address].    Your next appointment:   6 month(s)  Provider:   You will follow up in the Atrial Fibrillation Clinic located at [facility]. Your provider will be: Clint R. Fenton, PA-C or Lake Bells, PA-C

## 2023-08-26 NOTE — Progress Notes (Signed)
Electrophysiology Office Note:    Date:  08/26/2023   ID:  Maurice [name], DOB 02-Nov-1950, MRN [id number]  PCP:  Ronnald Nian, MD   [facility] Providers Cardiologist:  None Electrophysiologist:  Maurice Small, MD     Referring MD: Ronnald Nian, MD   History of Present Illness:    Maurice [name] is a 72 y.o. male with a hx listed below, significant for paroxysmal atrial fibrillation who presents for follow-up.  He has had prior atrial fibrillation ablations by Dr. Johney Frame, most recently June 2021.  At that time his right veins and posterior wall "box" had reconnected and required revision.  Since, he had recurrence and was placed on Tikosyn.  On Tikosyn, he has had very rare episodes of atrial fibrillation.  His most recent recording shows a burden of less than 0.04%.  He is aware of the episodes when he has them.  He is in atrial fibrillation today, 01/28/2023.  His ventricular rate is typically quite rapid in atrial fibrillation, 130-150 bpm.  Episodes do not last more than an hour.  According to his loop recorder, he has only had 1 other episode in the past 2 months.  He currently has palpitation and rapid rates in AF. No syncope, pre-syncope, or chest pain.         Current Medications:    Allergies:   Patient has no known allergies.   Social and Family History: Reviewed in Epic  ROS:   Please see the history of present illness.    All other systems reviewed and are negative.  EKGs/Labs/Other Studies Reviewed Today:    Echocardiogram:  TEE 07/11/2021 EF 50-55%  Monitors:       ILR in place  Stress testing:   Advanced imaging:   EKG:  Last EKG results: today -- AF with RVR   Recent Labs: 04/01/2023: ALT 26; BUN 13; Creatinine, Ser 0.91; Hemoglobin 16.2; Platelets 219; Potassium 4.2; Sodium 141     Physical Exam:    VS:  BP 120/80 (BP Location: Left Arm, Patient Position: Sitting, Cuff Size: Large)   Pulse (!) 53   Ht  5\' 8"  (1.727 m)   Wt 180 lb 6.4 oz (81.8 kg)   SpO2 97%   BMI 27.43 kg/m     Wt Readings from Last 3 Encounters:  08/26/23 180 lb 6.4 oz (81.8 kg)  04/01/23 175 lb 6.4 oz (79.6 kg)  01/28/23 179 lb 6.4 oz (81.4 kg)     GEN:  Well nourished, well developed in no acute distress CARDIAC: irregular tachy rhythm, no murmurs, rubs, gallops RESPIRATORY:  Normal work of breathing MUSCULOSKELETAL: no edema    ASSESSMENT & PLAN:    Paroxysmal atrial fibrillation Status post repeat ablations, most recently in 2021 with reconnection of the right pulmonary veins and posterior wall Currently maintained on Tikosyn with AF burden less than 0.5% Rates in A-fib are not well controlled but episodes are brief Labs from 04/01/2023 reviewed ECG today reviewed, Qtc 444 Continue Cardizem 180 mg Episodes appear to be occurring predominantly at night time. Will order sleep study  High risk medication - dofetilide Labs from 04/01/2023 reviewed ECG today reviewed, Qtc 444  Secondary hypercoagulable state due to atrial fibrillation Continue Xarelto 20 mg        Medication Adjustments/Labs and Tests Ordered: Current medicines are reviewed at length with the patient today.  Concerns regarding medicines are outlined above.  Orders Placed This Encounter  Procedures   EKG  12-Lead   No orders of the defined types were placed in this encounter.    Signed, Maurice Small, MD  08/26/2023 10:54 AM    [facility]

## 2023-08-26 NOTE — Telephone Encounter (Signed)
DR Reggie Pile STUDY.  Patient agreement reviewed and signed on 08/26/2023.  WatchPAT issued to patient on 08/26/2023 by Danielle Rankin, CMA. Patient aware to not open the WatchPAT box until contacted with the activation PIN. Patient profile initialized in CloudPAT on 08/26/2023 by Danielle Rankin, CMA. Device serial number: [id number]  Please list Reason for Call as Advice Only and type "WatchPAT issued to patient" in the comment box.

## 2023-08-27 ENCOUNTER — Other Ambulatory Visit: Payer: Self-pay | Admitting: Family Medicine

## 2023-08-27 DIAGNOSIS — Z8619 Personal history of other infectious and parasitic diseases: Secondary | ICD-10-CM

## 2023-08-28 DIAGNOSIS — M25611 Stiffness of right shoulder, not elsewhere classified: Secondary | ICD-10-CM | POA: Diagnosis not present

## 2023-08-28 DIAGNOSIS — M25511 Pain in right shoulder: Secondary | ICD-10-CM | POA: Diagnosis not present

## 2023-09-01 ENCOUNTER — Ambulatory Visit: Payer: Medicare Other

## 2023-09-01 DIAGNOSIS — I480 Paroxysmal atrial fibrillation: Secondary | ICD-10-CM

## 2023-09-01 LAB — CUP PACEART REMOTE DEVICE CHECK
Date Time Interrogation Session: 20241115230153
Implantable Pulse Generator Implant Date: 20201228

## 2023-09-02 DIAGNOSIS — M25511 Pain in right shoulder: Secondary | ICD-10-CM | POA: Diagnosis not present

## 2023-09-02 DIAGNOSIS — M25611 Stiffness of right shoulder, not elsewhere classified: Secondary | ICD-10-CM | POA: Diagnosis not present

## 2023-09-04 DIAGNOSIS — M25611 Stiffness of right shoulder, not elsewhere classified: Secondary | ICD-10-CM | POA: Diagnosis not present

## 2023-09-04 DIAGNOSIS — M25511 Pain in right shoulder: Secondary | ICD-10-CM | POA: Diagnosis not present

## 2023-09-08 DIAGNOSIS — Z96611 Presence of right artificial shoulder joint: Secondary | ICD-10-CM | POA: Diagnosis not present

## 2023-09-10 ENCOUNTER — Encounter: Payer: Self-pay | Admitting: Family Medicine

## 2023-09-14 DIAGNOSIS — R051 Acute cough: Secondary | ICD-10-CM | POA: Diagnosis not present

## 2023-09-14 DIAGNOSIS — J9801 Acute bronchospasm: Secondary | ICD-10-CM | POA: Diagnosis not present

## 2023-09-14 DIAGNOSIS — Z20822 Contact with and (suspected) exposure to covid-19: Secondary | ICD-10-CM | POA: Diagnosis not present

## 2023-09-18 DIAGNOSIS — J189 Pneumonia, unspecified organism: Secondary | ICD-10-CM | POA: Diagnosis not present

## 2023-09-18 DIAGNOSIS — R051 Acute cough: Secondary | ICD-10-CM | POA: Diagnosis not present

## 2023-09-24 NOTE — Progress Notes (Signed)
Carelink Summary Report / Loop Recorder 

## 2023-10-01 DIAGNOSIS — H2513 Age-related nuclear cataract, bilateral: Secondary | ICD-10-CM | POA: Diagnosis not present

## 2023-10-02 ENCOUNTER — Ambulatory Visit: Payer: Medicare Other | Admitting: Family Medicine

## 2023-10-02 ENCOUNTER — Encounter: Payer: Self-pay | Admitting: Family Medicine

## 2023-10-02 VITALS — BP 122/84 | HR 65 | Temp 97.7°F | Wt 179.4 lb

## 2023-10-02 DIAGNOSIS — J45901 Unspecified asthma with (acute) exacerbation: Secondary | ICD-10-CM

## 2023-10-02 DIAGNOSIS — R052 Subacute cough: Secondary | ICD-10-CM

## 2023-10-02 MED ORDER — PREDNISONE 10 MG (21) PO TBPK: ORAL_TABLET | ORAL | 0 refills | Status: DC

## 2023-10-02 MED ORDER — DOXYCYCLINE HYCLATE 100 MG PO TABS: 100.0000 mg | ORAL_TABLET | Freq: Two times a day (BID) | ORAL | 0 refills | Status: DC

## 2023-10-02 MED ORDER — BENZONATATE 200 MG PO CAPS: 200.0000 mg | ORAL_CAPSULE | Freq: Three times a day (TID) | ORAL | 0 refills | Status: DC | PRN

## 2023-10-02 NOTE — Patient Instructions (Signed)
  Stay well hydrated. Take the doxycycline twice daily for 10 days. Avoid the sun or use sunscreen. Take the prednisone as directed, along with food. Continue to use the albuterol inhaler, if needed for chest tightness, wheezing or shortness of breath. You may continue the Delsym syrup, but I recommend adding a Mucinex (plain--only ingredient is guaifenesin) 12 hour tablet, and also take that twice daily. I also sent in a prescription for benzonatate (tessalon perles), a different type of cough medication.  You can use this if the Delsym isn't working well.  I believe it is okay to use both together, if needed.  Return if you have fever, persistent cough, shortness of breath, pain with breathing, or other concerns.

## 2023-10-02 NOTE — Progress Notes (Signed)
Chief Complaint  Patient presents with   Cough    1 month. Tried prednisone and doxycycline. Gets worse when laying down, wheezing and crackling sound. -AA   12/1 went to UC, had been sick for a week. He had negative tests for flu and COVID.  Nebulizer treatment helped. He was prescribed prednisone and albuterol. Cough got worse, went back to UC on 12/5. CXR didn't show infiltrate (reviewed in Care Everywhere) Treated with doxycycline x 5d, and he got better around 12/11-12. Over the last few days he has been getting worse again. Last night was a "tough night".  Currently he reports some sinus congestion, fatigue. Cough is worse laying down. Nasal drainage is clear..  Denies sinus pain, just congestion. No sore throat, ear pain. Cough is nonproductive--unable to get anything up. Hears wheezing and crackling sounds from his chest while laying down. Better when upright.  Denies any DOE. Inhaler helps some--using it twice daily.  Never had any fever, chills.     PMH, PSH, SH reviewed  Outpatient Encounter Medications as of 10/02/2023  Medication Sig Note   diltiazem (CARDIZEM CD) 180 MG 24 hr capsule TAKE 1 CAPSULE(180 MG) BY MOUTH DAILY    dofetilide (TIKOSYN) 250 MCG capsule Take 1 capsule (250 mcg total) by mouth 2 (two) times daily. 10/02/2023: Last dose this morning -AA   finasteride (PROSCAR) 5 MG tablet Take 1 tablet (5 mg total) by mouth daily. 10/02/2023: Last dose this morning. -AA   Multiple Vitamin (MULTI VITAMIN PO) Take 1 tablet by mouth daily.  10/02/2023: Last dose last night -AA   omeprazole (PRILOSEC) 40 MG capsule Take 1 capsule (40 mg total) by mouth daily as needed (heartburn or acid reflux). 10/02/2023: Last dose two days ago -AA   potassium chloride (KLOR-CON) 10 MEQ tablet Take 1 tablet (10 mEq total) by mouth daily. 10/02/2023: Last dose last night -AA   rivaroxaban (XARELTO) 20 MG TABS tablet Take 1 tablet (20 mg total) by mouth daily with supper.  10/02/2023: Last dose last night -AA   rosuvastatin (CRESTOR) 40 MG tablet Take 1 tablet (40 mg total) by mouth daily. 10/02/2023: Last dose last night -AA   acyclovir (ZOVIRAX) 400 MG tablet TAKE 1 TABLET BY MOUTH THREE TIMES DAILY AS NEEDED AS DIRECTED (Patient not taking: Reported on 10/02/2023)    No facility-administered encounter medications on file as of 10/02/2023.   No Known Allergies   ROS: no f/c, no n/v/d, no rashes, bleeding, bruising No chest pain, shortness of breath. Has atrial fibrillation. Some easy bruising, no bleeding. Denies chest pain. See HPI   PHYSICAL EXAM:  BP 122/84   Pulse 65   Temp 97.7 F (36.5 C) (Oral)   Wt 179 lb 6.4 oz (81.4 kg)   SpO2 96%   BMI 27.28 kg/m   Pleasant male, with dry,hacky cough periodically. Speaking comfortably, in no distress HEENT: conjunctiva and sclera are clear, EOMI. TM's and EAC's normal. Nasal mucosa normal, mild edema, no purulence. Sinuses nontender. OP is clear Neck: no lymphadenopathy or mass Heart: regular rate and rhythm with occasional pause, no murmur Lungs: fair air movement.  Some dry cough with deep breath and laughing. No wheezes, rales ronchi appreciable. Back: no spinal or CVA tenderness Extremities: no edema Skin: Purpura on L forearm. Normal turgor, no rash    ASSESSMENT/PLAN:  Asthmatic bronchitis with acute exacerbation, unspecified asthma severity, unspecified whether persistent - RAD component; retreat with longer course of doxy and another course of prednisone, cont albuterol  prn. Mucinex BID - Plan: doxycycline (VIBRA-TABS) 100 MG tablet, predniSONE (STERAPRED UNI-PAK 21 TAB) 10 MG (21) TBPK tablet  Subacute cough - Plan: benzonatate (TESSALON) 200 MG capsule, predniSONE (STERAPRED UNI-PAK 21 TAB) 10 MG (21) TBPK tablet   Stay well hydrated. Take the doxycycline twice daily for 10 days. Avoid the sun or use sunscreen. Take the prednisone as directed, along with food. Continue to use the  albuterol inhaler, if needed for chest tightness, wheezing or shortness of breath. You may continue the Delsym syrup, but I recommend adding a Mucinex (plain--only ingredient is guaifenesin) 12 hour tablet, and also take that twice daily. I also sent in a prescription for benzonatate (tessalon perles), a different type of cough medication.  You can use this if the Delsym isn't working well.  I believe it is okay to use both together, if needed.  Return if you have fever, persistent cough, shortness of breath, pain with breathing, or other concerns.

## 2023-10-06 ENCOUNTER — Ambulatory Visit (INDEPENDENT_AMBULATORY_CARE_PROVIDER_SITE_OTHER): Payer: Medicare Other

## 2023-10-06 DIAGNOSIS — I480 Paroxysmal atrial fibrillation: Secondary | ICD-10-CM | POA: Diagnosis not present

## 2023-10-06 LAB — CUP PACEART REMOTE DEVICE CHECK
Date Time Interrogation Session: 20241222230208
Implantable Pulse Generator Implant Date: 20201228

## 2023-10-13 DIAGNOSIS — Z96611 Presence of right artificial shoulder joint: Secondary | ICD-10-CM | POA: Diagnosis not present

## 2023-10-13 DIAGNOSIS — M25511 Pain in right shoulder: Secondary | ICD-10-CM | POA: Diagnosis not present

## 2023-10-29 DIAGNOSIS — M1812 Unilateral primary osteoarthritis of first carpometacarpal joint, left hand: Secondary | ICD-10-CM | POA: Diagnosis not present

## 2023-11-04 DIAGNOSIS — K5730 Diverticulosis of large intestine without perforation or abscess without bleeding: Secondary | ICD-10-CM | POA: Diagnosis not present

## 2023-11-04 DIAGNOSIS — K2100 Gastro-esophageal reflux disease with esophagitis, without bleeding: Secondary | ICD-10-CM | POA: Diagnosis not present

## 2023-11-04 DIAGNOSIS — Z860100 Personal history of colon polyps, unspecified: Secondary | ICD-10-CM | POA: Diagnosis not present

## 2023-11-10 ENCOUNTER — Ambulatory Visit (INDEPENDENT_AMBULATORY_CARE_PROVIDER_SITE_OTHER): Payer: Medicare Other

## 2023-11-10 DIAGNOSIS — I480 Paroxysmal atrial fibrillation: Secondary | ICD-10-CM | POA: Diagnosis not present

## 2023-11-10 LAB — CUP PACEART REMOTE DEVICE CHECK
Date Time Interrogation Session: 20250126230330
Implantable Pulse Generator Implant Date: 20201228

## 2023-11-11 NOTE — Progress Notes (Signed)
Carelink Summary Report / Loop Recorder

## 2023-11-14 ENCOUNTER — Encounter: Payer: Self-pay | Admitting: Cardiovascular Disease

## 2023-11-17 DIAGNOSIS — Z96611 Presence of right artificial shoulder joint: Secondary | ICD-10-CM | POA: Diagnosis not present

## 2023-11-17 DIAGNOSIS — Z471 Aftercare following joint replacement surgery: Secondary | ICD-10-CM | POA: Diagnosis not present

## 2023-12-09 ENCOUNTER — Other Ambulatory Visit: Payer: Self-pay | Admitting: Medical Genetics

## 2023-12-09 ENCOUNTER — Encounter: Payer: Self-pay | Admitting: Internal Medicine

## 2023-12-15 ENCOUNTER — Ambulatory Visit (INDEPENDENT_AMBULATORY_CARE_PROVIDER_SITE_OTHER): Payer: Medicare Other

## 2023-12-15 ENCOUNTER — Other Ambulatory Visit (HOSPITAL_COMMUNITY)
Admission: RE | Admit: 2023-12-15 | Discharge: 2023-12-15 | Disposition: A | Discharge status: Home / Self Care | Payer: Self-pay | Source: Ambulatory Visit | Attending: Medical Genetics | Admitting: Medical Genetics

## 2023-12-15 DIAGNOSIS — I480 Paroxysmal atrial fibrillation: Secondary | ICD-10-CM | POA: Diagnosis not present

## 2023-12-16 LAB — CUP PACEART REMOTE DEVICE CHECK
Date Time Interrogation Session: 20250302230138
Implantable Pulse Generator Implant Date: 20201228

## 2023-12-17 ENCOUNTER — Encounter: Payer: Self-pay | Admitting: Cardiovascular Disease

## 2023-12-18 NOTE — Progress Notes (Signed)
 Carelink Summary Report / Loop Recorder

## 2023-12-23 DIAGNOSIS — H52202 Unspecified astigmatism, left eye: Secondary | ICD-10-CM | POA: Diagnosis not present

## 2023-12-23 DIAGNOSIS — H25012 Cortical age-related cataract, left eye: Secondary | ICD-10-CM | POA: Diagnosis not present

## 2023-12-23 DIAGNOSIS — H2512 Age-related nuclear cataract, left eye: Secondary | ICD-10-CM | POA: Diagnosis not present

## 2023-12-23 DIAGNOSIS — H25812 Combined forms of age-related cataract, left eye: Secondary | ICD-10-CM | POA: Diagnosis not present

## 2023-12-26 LAB — GENECONNECT MOLECULAR SCREEN: Genetic Analysis Overall Interpretation: NEGATIVE

## 2023-12-31 DIAGNOSIS — Z96611 Presence of right artificial shoulder joint: Secondary | ICD-10-CM | POA: Diagnosis not present

## 2023-12-31 DIAGNOSIS — S42124K Nondisplaced fracture of acromial process, right shoulder, subsequent encounter for fracture with nonunion: Secondary | ICD-10-CM | POA: Diagnosis not present

## 2024-01-02 DIAGNOSIS — R3912 Poor urinary stream: Secondary | ICD-10-CM | POA: Diagnosis not present

## 2024-01-02 DIAGNOSIS — Z8042 Family history of malignant neoplasm of prostate: Secondary | ICD-10-CM | POA: Diagnosis not present

## 2024-01-02 DIAGNOSIS — N401 Enlarged prostate with lower urinary tract symptoms: Secondary | ICD-10-CM | POA: Diagnosis not present

## 2024-01-02 DIAGNOSIS — R3915 Urgency of urination: Secondary | ICD-10-CM | POA: Diagnosis not present

## 2024-01-07 DIAGNOSIS — M1812 Unilateral primary osteoarthritis of first carpometacarpal joint, left hand: Secondary | ICD-10-CM | POA: Diagnosis not present

## 2024-01-08 ENCOUNTER — Telehealth: Payer: Self-pay | Admitting: *Deleted

## 2024-01-08 NOTE — Telephone Encounter (Signed)
   Pre-operative Risk Assessment    Patient Name: Maurice [name]  DOB: February 06, 1951 MRN: [id number]   Date of last office visit: 08/26/23 Date of next office visit: NONE    Request for Surgical Clearance    Procedure:   LEFT THUMB CARPOMETACARPAL JOINT SUSPENSIOIN ARTHROPLASTY WITH TENDON TRANSFER   Date of Surgery:  Clearance TBD                                Surgeon:  DR. Philipp Ovens Surgeon's Group or Practice Name:  [facility] Phone number:  [phone] Fax number:  [phone]   Type of Clearance Requested:   - Medical  - Pharmacy:  Hold Rivaroxaban (Xarelto) NOT INDICATED   Type of Anesthesia:   BLOCK & IV SEDATION   Additional requests/questions:    Wilhemina Cash   01/08/2024, 7:42 AM

## 2024-01-12 ENCOUNTER — Telehealth: Payer: Self-pay

## 2024-01-12 NOTE — Telephone Encounter (Signed)
 Spoke with patient who is agreeable to do a tele visit on 4/7 at 9:20 am. Med rec and consent done.

## 2024-01-12 NOTE — Telephone Encounter (Signed)
   Name: Maurice [name]  DOB: 1951/04/01  MRN: [id number]  Primary Cardiologist: None  Chart reviewed as part of pre-operative protocol coverage. Because of [name]'s past medical history and time since last visit, he will require a follow-up telephone visit in order to better assess preoperative cardiovascular risk.  Pre-op covering staff: - Please schedule appointment and call patient to inform them. If patient already had an upcoming appointment within acceptable timeframe, please add "pre-op clearance" to the appointment notes so provider is aware. - Please contact requesting surgeon's office via preferred method (i.e, phone, fax) to inform them of need for appointment prior to surgery.   Per office protocol, patient can hold Xarelto for 3 days prior to procedure. Please resume when medically safe to do so.  Sharlene Dory, PA-C  01/12/2024, 11:45 AM

## 2024-01-12 NOTE — Telephone Encounter (Signed)
  Patient Consent for Virtual Visit        Maurice [name] has provided verbal consent on 01/12/2024 for a virtual visit (video or telephone).   CONSENT FOR VIRTUAL VISIT FOR:  Maurice [name]  By participating in this virtual visit I agree to the following:  I hereby voluntarily request, consent and authorize [facility] and its employed or contracted physicians, physician assistants, nurse practitioners or other licensed health care professionals (the Practitioner), to provide me with telemedicine health care services (the "Services") as deemed necessary by the treating Practitioner. I acknowledge and consent to receive the Services by the Practitioner via telemedicine. I understand that the telemedicine visit will involve communicating with the Practitioner through live audiovisual communication technology and the disclosure of certain medical information by electronic transmission. I acknowledge that I have been given the opportunity to request an in-person assessment or other available alternative prior to the telemedicine visit and am voluntarily participating in the telemedicine visit.  I understand that I have the right to withhold or withdraw my consent to the use of telemedicine in the course of my care at any time, without affecting my right to future care or treatment, and that the Practitioner or I may terminate the telemedicine visit at any time. I understand that I have the right to inspect all information obtained and/or recorded in the course of the telemedicine visit and may receive copies of available information for a reasonable fee.  I understand that some of the potential risks of receiving the Services via telemedicine include:  Delay or interruption in medical evaluation due to technological equipment failure or disruption; Information transmitted may not be sufficient (e.g. poor resolution of images) to allow for appropriate medical decision making by the  Practitioner; and/or  In rare instances, security protocols could fail, causing a breach of personal health information.  Furthermore, I acknowledge that it is my responsibility to provide information about my medical history, conditions and care that is complete and accurate to the best of my ability. I acknowledge that Practitioner's advice, recommendations, and/or decision may be based on factors not within their control, such as incomplete or inaccurate data provided by me or distortions of diagnostic images or specimens that may result from electronic transmissions. I understand that the practice of medicine is not an exact science and that Practitioner makes no warranties or guarantees regarding treatment outcomes. I acknowledge that a copy of this consent can be made available to me via my patient portal [facility] MyChart), or I can request a printed copy by calling the office of [facility].    I understand that my insurance will be billed for this visit.   I have read or had this consent read to me. I understand the contents of this consent, which adequately explains the benefits and risks of the Services being provided via telemedicine.  I have been provided ample opportunity to ask questions regarding this consent and the Services and have had my questions answered to my satisfaction. I give my informed consent for the services to be provided through the use of telemedicine in my medical care

## 2024-01-12 NOTE — Telephone Encounter (Signed)
 Patient with diagnosis of afib on Xarelto for anticoagulation.    Procedure: LEFT THUMB CARPOMETACARPAL JOINT SUSPENSIOIN ARTHROPLASTY WITH TENDON TRANSFER  Date of procedure: TBD   CHA2DS2-VASc Score = 2   This indicates a 2.2% annual risk of stroke. The patient's score is based upon: CHF History: 0 HTN History: 1 Diabetes History: 0 Stroke History: 0 Vascular Disease History: 0 Age Score: 1 Gender Score: 0      CrCl 84 ml/min Platelet count 219  Per office protocol, patient can hold Xarelto for 3 days prior to procedure.    **This guidance is not considered finalized until pre-operative APP has relayed final recommendations.**

## 2024-01-14 ENCOUNTER — Other Ambulatory Visit: Payer: Self-pay | Admitting: Cardiovascular Disease

## 2024-01-15 NOTE — Progress Notes (Signed)
 Carelink Summary Report / Loop Recorder

## 2024-01-15 NOTE — Addendum Note (Signed)
 Addended by: Geralyn Flash D on: 01/15/2024 10:13 AM   Modules accepted: Orders

## 2024-01-19 ENCOUNTER — Ambulatory Visit: Attending: Cardiovascular Disease | Admitting: Emergency Medicine

## 2024-01-19 ENCOUNTER — Ambulatory Visit: Payer: Medicare Other

## 2024-01-19 DIAGNOSIS — I480 Paroxysmal atrial fibrillation: Secondary | ICD-10-CM | POA: Diagnosis not present

## 2024-01-19 DIAGNOSIS — Z01810 Encounter for preprocedural cardiovascular examination: Secondary | ICD-10-CM

## 2024-01-19 NOTE — Progress Notes (Signed)
 Virtual Visit via Telephone Note   Because of DEMARCUS [name] co-morbid illnesses, he is at least at moderate risk for complications without adequate follow up.  This format is felt to be most appropriate for this patient at this time.  Due to technical limitations with video connection (technology), today's appointment will be conducted as an audio only telehealth visit, and Maurice [name] verbally agreed to proceed in this manner.   All issues noted in this document were discussed and addressed.  No physical exam could be performed with this format.  Evaluation Performed:  Preoperative cardiovascular risk assessment _____________   Date:  01/19/2024   Patient ID:  Maurice [name], DOB Jun 22, 1951, MRN [id number] Patient Location:  Home Provider location:   Office  Primary Care Provider:  Ronnald Nian, MD Primary Cardiologist:  None  Chief Complaint / Patient Profile   73 y.o. y/o male with a h/o nonobstructive CAD per CTA, PAF status post multiple A-fib ablations on anticoagulation, hypertension, hyperlipidemia, and GERD  who is pending left thumb carpometacarpal joint suspension arthoplasty with tendon transfer with [facility] on date TBD and presents today for telephonic preoperative cardiovascular risk assessment.  History of Present Illness    Maurice [name] is a 73 y.o. male who presents via audio/video conferencing for a telehealth visit today.  Pt was last seen in cardiology clinic on 08/26/2023 by Dr. Nelly Laurence.  At that time Maurice [name] was doing well.  The patient is now pending procedure as outlined above. Since his last visit, he denies chest pain, shortness of breath, lower extremity edema, fatigue, palpitations, melena, hematuria, hemoptysis, diaphoresis, weakness, presyncope, syncope, orthopnea, and PND.  He continues to be a very active individual.  He goes to spin class at least 3-4 times a week without any exertional symptoms.  Past Medical  History    Past Medical History:  Diagnosis Date   Atrial fibrillation (HCC)    paroxysmal s/p PVI by JA 2010, 2014, 2017   Atypical mole 06/22/2013   mild - r chest   Atypical mole 08/23/2014   moderate - l paraspinal   Atypical mole 03/26/2016   mild- R lower back (recheck evert )   Basal cell carcinoma of skin 07/16/2018   Nod-R Nare (MOHS)   Diverticulosis    Colonoscopy 07/2013   Herpes    History of hepatitis B    Hypercholesterolemia    Squamous cell carcinoma of skin of right thumb 08/28/2014   insitu - R Thumb - currett, cautery   Varicose veins    Past Surgical History:  Procedure Laterality Date   atrail fibrillation ablation  7/10, 1//17/14   PVI by JA   ATRIAL FIBRILLATION ABLATION N/A 10/29/2012   Procedure: ATRIAL FIBRILLATION ABLATION;  Surgeon: Hillis Range, MD;  Location: [facility] CATH LAB;  Service: Cardiovascular;  Laterality: N/A;   ATRIAL FIBRILLATION ABLATION N/A 03/24/2020   Procedure: ATRIAL FIBRILLATION ABLATION;  Surgeon: Hillis Range, MD;  Location: [facility] INVASIVE CV LAB;  Service: Cardiovascular;  Laterality: N/A;   CARDIOVERSION N/A 09/20/2015   Procedure: CARDIOVERSION;  Surgeon: Vesta Mixer, MD;  Location: [facility] ENDOSCOPY;  Service: Cardiovascular;  Laterality: N/A;   CARDIOVERSION N/A 09/28/2015   Procedure: CARDIOVERSION;  Surgeon: Lewayne Bunting, MD;  Location: [facility] ENDOSCOPY;  Service: Cardiovascular;  Laterality: N/A;   CARDIOVERSION N/A 07/11/2021   Procedure: CARDIOVERSION;  Surgeon: Sande Rives, MD;  Location: [facility] ENDOSCOPY;  Service: Cardiovascular;  Laterality: N/A;   COLONOSCOPY  2004   ELECTROPHYSIOLOGIC STUDY N/A 11/28/2015   3rd AF ablation by Dr Johney Frame   implantable loop recorder placement  10/11/2019   Medtronic Reveal [place] model LNQ22 (SN [id number]) by Dr Johney Frame for afib management post ablation   SKIN BIOPSY Right 11/03/2018   TEE WITHOUT CARDIOVERSION  10/29/2012   Procedure: TRANSESOPHAGEAL ECHOCARDIOGRAM (TEE);   Surgeon: Pricilla Riffle, MD;  Location: [facility] ENDOSCOPY;  Service: Cardiovascular;  Laterality: N/A;  pre ablation   TEE WITHOUT CARDIOVERSION N/A 09/20/2015   Procedure: TRANSESOPHAGEAL ECHOCARDIOGRAM (TEE);  Surgeon: Vesta Mixer, MD;  Location: [facility] ENDOSCOPY;  Service: Cardiovascular;  Laterality: N/A;   TEE WITHOUT CARDIOVERSION N/A 07/11/2021   Procedure: TRANSESOPHAGEAL ECHOCARDIOGRAM (TEE);  Surgeon: Sande Rives, MD;  Location: [facility] ENDOSCOPY;  Service: Cardiovascular;  Laterality: N/A;   TONSILLECTOMY     when child    Allergies  No Known Allergies  Home Medications    Prior to Admission medications   Medication Sig Start Date End Date Taking? Authorizing Provider  acyclovir (ZOVIRAX) 400 MG tablet TAKE 1 TABLET BY MOUTH THREE TIMES DAILY AS NEEDED AS DIRECTED 08/28/23   Ronnald Nian, MD  benzonatate (TESSALON) 200 MG capsule Take 1 capsule (200 mg total) by mouth 3 (three) times daily as needed for cough. 10/02/23   Joselyn Arrow, MD  diltiazem (CARDIZEM CD) 180 MG 24 hr capsule TAKE 1 CAPSULE BY MOUTH EVERY DAY 01/14/24   Mealor, Roberts Gaudy, MD  dofetilide (TIKOSYN) 250 MCG capsule Take 1 capsule (250 mcg total) by mouth 2 (two) times daily. 05/19/23   Mealor, Roberts Gaudy, MD  doxycycline (VIBRA-TABS) 100 MG tablet Take 1 tablet (100 mg total) by mouth 2 (two) times daily. 10/02/23   Joselyn Arrow, MD  finasteride (PROSCAR) 5 MG tablet Take 1 tablet (5 mg total) by mouth daily. 04/01/23   Ronnald Nian, MD  Multiple Vitamin (MULTI VITAMIN PO) Take 1 tablet by mouth daily.     [provider]  omeprazole (PRILOSEC) 40 MG capsule Take 1 capsule (40 mg total) by mouth daily as needed (heartburn or acid reflux). 04/01/23   Ronnald Nian, MD  potassium chloride (KLOR-CON) 10 MEQ tablet TAKE 1 TABLET(10 MEQ) BY MOUTH DAILY 01/14/24   Mealor, Roberts Gaudy, MD  predniSONE (STERAPRED UNI-PAK 21 TAB) 10 MG (21) TBPK tablet Take as directed 10/02/23   Joselyn Arrow, MD  rivaroxaban (XARELTO)  20 MG TABS tablet Take 1 tablet (20 mg total) by mouth daily with supper. 08/26/23   Mealor, Roberts Gaudy, MD  rosuvastatin (CRESTOR) 40 MG tablet Take 1 tablet (40 mg total) by mouth daily. 04/02/23   Ronnald Nian, MD    Physical Exam    Vital Signs:  Madelon Lips does not have vital signs available for review today.  Given telephonic nature of communication, physical exam is limited. AAOx3. NAD. Normal affect.  Speech and respirations are unlabored.  Accessory Clinical Findings    None  Assessment & Plan    1.  Preoperative Cardiovascular Risk Assessment: According to the Revised Cardiac Risk Index (RCRI), his Perioperative Risk of Major Cardiac Event is (%): 0.4. His Functional Capacity in METs is: 8.97 according to the Duke Activity Status Index (DASI). Therefore, based on ACC/AHA guidelines, patient would be at acceptable risk for the planned procedure without further cardiovascular testing.  The patient was advised that if he develops new symptoms prior to surgery to contact our office to arrange for a follow-up visit, and  he verbalized understanding.  Per office protocol, patient can hold Xarelto for 3 days prior to procedure.   A copy of this note will be routed to requesting surgeon.  Time:   Today, I have spent 6 minutes with the patient with telehealth technology discussing medical history, symptoms, and management plan.     Denyce Robert, NP  01/19/2024, 9:23 AM

## 2024-01-20 LAB — CUP PACEART REMOTE DEVICE CHECK
Date Time Interrogation Session: 20250406230050
Implantable Pulse Generator Implant Date: 20201228

## 2024-01-25 ENCOUNTER — Encounter: Payer: Self-pay | Admitting: Cardiovascular Disease

## 2024-02-03 DIAGNOSIS — G8918 Other acute postprocedural pain: Secondary | ICD-10-CM | POA: Diagnosis not present

## 2024-02-03 DIAGNOSIS — M1812 Unilateral primary osteoarthritis of first carpometacarpal joint, left hand: Secondary | ICD-10-CM | POA: Diagnosis not present

## 2024-02-11 DIAGNOSIS — S42124G Nondisplaced fracture of acromial process, right shoulder, subsequent encounter for fracture with delayed healing: Secondary | ICD-10-CM | POA: Diagnosis not present

## 2024-02-11 DIAGNOSIS — S42124K Nondisplaced fracture of acromial process, right shoulder, subsequent encounter for fracture with nonunion: Secondary | ICD-10-CM | POA: Diagnosis not present

## 2024-02-11 DIAGNOSIS — Z96611 Presence of right artificial shoulder joint: Secondary | ICD-10-CM | POA: Diagnosis not present

## 2024-02-13 ENCOUNTER — Other Ambulatory Visit: Payer: Self-pay | Admitting: Cardiovascular Disease

## 2024-02-17 ENCOUNTER — Telehealth: Payer: Self-pay | Admitting: *Deleted

## 2024-02-17 NOTE — Telephone Encounter (Signed)
 Copied from CRM [id number]. Topic: Appointments - Appointment Scheduling >> Feb 17, 2024  9:55 AM [place] A wrote: Patient called to schedule Physical and would like to know if he can have labs that morning return for appointment in the afternoon? Please contact patient via Mychart  I believe this is a [facility] patient that E2C2 scheduled on Dr. Chase Copping schedule for June.

## 2024-02-18 DIAGNOSIS — G8918 Other acute postprocedural pain: Secondary | ICD-10-CM | POA: Diagnosis not present

## 2024-02-18 DIAGNOSIS — M25642 Stiffness of left hand, not elsewhere classified: Secondary | ICD-10-CM | POA: Diagnosis not present

## 2024-02-18 DIAGNOSIS — M1812 Unilateral primary osteoarthritis of first carpometacarpal joint, left hand: Secondary | ICD-10-CM | POA: Diagnosis not present

## 2024-02-23 ENCOUNTER — Ambulatory Visit (INDEPENDENT_AMBULATORY_CARE_PROVIDER_SITE_OTHER): Payer: Medicare Other

## 2024-02-23 ENCOUNTER — Ambulatory Visit (HOSPITAL_COMMUNITY)
Admission: RE | Admit: 2024-02-23 | Discharge: 2024-02-23 | Disposition: A | Discharge status: Home / Self Care | Payer: Medicare Other | Source: Ambulatory Visit | Attending: Internal Medicine | Admitting: Internal Medicine

## 2024-02-23 VITALS — BP 150/92 | HR 55 | Ht 68.0 in | Wt 179.4 lb

## 2024-02-23 DIAGNOSIS — I480 Paroxysmal atrial fibrillation: Secondary | ICD-10-CM | POA: Diagnosis not present

## 2024-02-23 DIAGNOSIS — Z5181 Encounter for therapeutic drug level monitoring: Secondary | ICD-10-CM

## 2024-02-23 DIAGNOSIS — D6869 Other thrombophilia: Secondary | ICD-10-CM | POA: Diagnosis not present

## 2024-02-23 DIAGNOSIS — Z79899 Other long term (current) drug therapy: Secondary | ICD-10-CM

## 2024-02-23 DIAGNOSIS — I4891 Unspecified atrial fibrillation: Secondary | ICD-10-CM

## 2024-02-23 LAB — CUP PACEART REMOTE DEVICE CHECK
Date Time Interrogation Session: 20250511233143
Implantable Pulse Generator Implant Date: 20201228

## 2024-02-23 NOTE — Progress Notes (Signed)
 Primary Care Physician: Watson Hacking, MD Referring Physician: Dr. Leandrew Proctor is a 73 y.o. male with a h/o paroxysmal afib that underwent afib ablation x 4, 2 in 2014, one in 2017 and last ablation in 2021.  Patient noted elevated heart rates for the past two weeks. ILR shows an increased afib burden since the end of August. ? If the device is undersensing atrial flutter. He was started on diltiazem  and Xarelto  on 06/29/21 and his heart racing  resolved with last visit. He then went into persistent atrial flutter with RVR, rate today 156 bpm since 9/22. He feels weak and miserable. He takes 120 mg cadizem daily and has been taking prn cardizem  on a regular basis since in rapid flutter, but has touched his rate.  He has only been back on anticoagulation x 6 days so will require TEE/CV so sending hin to ED for urgent cardioversion is not an option.  There were no specific triggers that he could identify. CHA2DS2VASc  score is 2 so he really should stay on anticoagulation long term by guidelinies. BP adequate at 114/90.   Pt returns  today, 07/17/21 for return of atrial flutter at 150 bpm. Cardioversion lasted 4 days. Right now he seems to be tolerating. I am afraid to increase daily Cardizem  as it it dropped BP last week when out of rhythm. I discussed with Dr. Nunzio Belch how to approach. He suggested 600 mg propafenone  today and then 225 mg bid and return to clinic for ekg on Thursday. If this fails then possibly tikosyn ,  but Dr. Nunzio Belch  is not in favor of another ablation. Pt is on young  side for amiodarone.   F/u in afib clinic for start of Rythmol , 07/19/21.The original plan was to take 300 mg of Rythmol  and then start 225 mg ER bid the next day. The pharmacy did not have either dose but could get the 225 mg of Rythmol  before he could get the 600 mg dose, would not be for several days. So he just started the 225 mg dose and went back into SR this am, after dose last pm and this am  dose. He is feeling improved, but a little lightheaded  this pm.   Pt being seen in the afib clinic, 10/13,  as he reported going back out of rhythm on Monday, confirmed with Linq and EKG shows rapid aflutter. He is on extended release propafenone  225 mg bid . I discussed with Dr. Nunzio Belch who suggested to try to increase to 325 mg bid and bring in on Friday for EKG. Unfortunately, the 2 drugstores where the drug was least expensive, would take 2-3 days to get the drug in. Alsp, he feels very lightheaded on drug, even in SR and I was afraid that he would not tolerate the higher dose. Therefore, will washout drug x 3 days, none after tomorrow and will plan for tikosyn  admit next Monday. No benadryl  use, no missed anticoagulation and qt in SR was 421 ms.   Follow up in the AF clinic 07/30/21. Patient presents for dofetilide  admission. He stopped propafenone  on 07/26/21. He has not missed any doses of anticoagulation in the last 3 weeks. He actually converted to SR 07/28/21 and feels well today.   F/u in afib clinic, 08/09/21. He is now on tikosyn  250 mcg bid. He is in sinus brady running slightly lower today in the 40's. At home via his apple watch, he has been in the 50's  bpm range. He noted some afib over the weekend. He is being complaint with tikosyn .   On follow up 02/23/24, he is here for Tikosyn  surveillance. He is currently in NSR. Recent ILR review by Dr. Arlester Ladd in April showed 0.4% Afib burden. He is on Tikosyn  250 mcg BID. He is doing well overall. He is recovering from left thumb surgery. No missed doses of Tikosyn  or Xarelto .   Today, he denies symptoms of palpitations, chest pain, shortness of breath, orthopnea, PND, lower extremity edema, dizziness, presyncope, syncope, or neurologic sequela. The patient is tolerating medications without difficulties and is otherwise without complaint today.   Past Medical History:  Diagnosis Date   Atrial fibrillation (HCC)    paroxysmal s/p PVI by JA 2010,  2014, 2017   Atypical mole 06/22/2013   mild - r chest   Atypical mole 08/23/2014   moderate - l paraspinal   Atypical mole 03/26/2016   mild- R lower back (recheck evert )   Basal cell carcinoma of skin 07/16/2018   Nod-R Nare (MOHS)   Diverticulosis    Colonoscopy 07/2013   Herpes    History of hepatitis B    Hypercholesterolemia    Squamous cell carcinoma of skin of right thumb 08/28/2014   insitu - R Thumb - currett, cautery   Varicose veins    Past Surgical History:  Procedure Laterality Date   atrail fibrillation ablation  7/10, 1//17/14   PVI by JA   ATRIAL FIBRILLATION ABLATION N/A 10/29/2012   Procedure: ATRIAL FIBRILLATION ABLATION;  Surgeon: Jolly Needle, MD;  Location: [facility] CATH LAB;  Service: Cardiovascular;  Laterality: N/A;   ATRIAL FIBRILLATION ABLATION N/A 03/24/2020   Procedure: ATRIAL FIBRILLATION ABLATION;  Surgeon: Jolly Needle, MD;  Location: [facility] INVASIVE CV LAB;  Service: Cardiovascular;  Laterality: N/A;   CARDIOVERSION N/A 09/20/2015   Procedure: CARDIOVERSION;  Surgeon: Lake Pilgrim, MD;  Location: [facility] ENDOSCOPY;  Service: Cardiovascular;  Laterality: N/A;   CARDIOVERSION N/A 09/28/2015   Procedure: CARDIOVERSION;  Surgeon: Lenise Quince, MD;  Location: [facility] ENDOSCOPY;  Service: Cardiovascular;  Laterality: N/A;   CARDIOVERSION N/A 07/11/2021   Procedure: CARDIOVERSION;  Surgeon: Harrold Lincoln, MD;  Location: [facility] ENDOSCOPY;  Service: Cardiovascular;  Laterality: N/A;   COLONOSCOPY  2004   ELECTROPHYSIOLOGIC STUDY N/A 11/28/2015   3rd AF ablation by Dr Nunzio Belch   implantable loop recorder placement  10/11/2019   Medtronic Reveal [place] model LNQ22 (SN [id number]) by Dr Nunzio Belch for afib management post ablation   SKIN BIOPSY Right 11/03/2018   TEE WITHOUT CARDIOVERSION  10/29/2012   Procedure: TRANSESOPHAGEAL ECHOCARDIOGRAM (TEE);  Surgeon: Elmyra Haggard, MD;  Location: [facility] ENDOSCOPY;  Service: Cardiovascular;  Laterality: N/A;  pre ablation   TEE  WITHOUT CARDIOVERSION N/A 09/20/2015   Procedure: TRANSESOPHAGEAL ECHOCARDIOGRAM (TEE);  Surgeon: Lake Pilgrim, MD;  Location: [facility] ENDOSCOPY;  Service: Cardiovascular;  Laterality: N/A;   TEE WITHOUT CARDIOVERSION N/A 07/11/2021   Procedure: TRANSESOPHAGEAL ECHOCARDIOGRAM (TEE);  Surgeon: Harrold Lincoln, MD;  Location: [facility] ENDOSCOPY;  Service: Cardiovascular;  Laterality: N/A;   TONSILLECTOMY     when child    Current Outpatient Medications  Medication Sig Dispense Refill   acyclovir  (ZOVIRAX ) 400 MG tablet TAKE 1 TABLET BY MOUTH THREE TIMES DAILY AS NEEDED AS DIRECTED 30 tablet 5   diltiazem  (CARDIZEM  CD) 180 MG 24 hr capsule TAKE 1 CAPSULE BY MOUTH EVERY DAY 90 capsule 2   dofetilide  (TIKOSYN ) 250 MCG capsule TAKE ONE CAPSULE BY  MOUTH TWICE A DAY 180 capsule 1   finasteride  (PROSCAR ) 5 MG tablet Take 1 tablet (5 mg total) by mouth daily. 90 tablet 3   Multiple Vitamin (MULTI VITAMIN PO) Take 1 tablet by mouth daily.      omeprazole  (PRILOSEC) 40 MG capsule Take 1 capsule (40 mg total) by mouth daily as needed (heartburn or acid reflux). 90 capsule 3   potassium chloride  (KLOR-CON ) 10 MEQ tablet TAKE 1 TABLET(10 MEQ) BY MOUTH DAILY 90 tablet 2   rivaroxaban  (XARELTO ) 20 MG TABS tablet Take 1 tablet (20 mg total) by mouth daily with supper. 90 tablet 3   rosuvastatin  (CRESTOR ) 40 MG tablet Take 1 tablet (40 mg total) by mouth daily. 90 tablet 3   traMADol (ULTRAM) 50 MG tablet Take 50 mg by mouth at bedtime.     No current facility-administered medications for this encounter.    No Known Allergies  ROS- All systems are reviewed and negative except as per the HPI above  Physical Exam: Vitals:   02/23/24 0818  Weight: 81.4 kg  Height: 5\' 8"  (1.727 m)     Wt Readings from Last 3 Encounters:  02/23/24 81.4 kg  10/02/23 81.4 kg  08/26/23 81.8 kg    Labs: Lab Results  Component Value Date   NA 141 04/01/2023   K 4.2 04/01/2023   CL 106 04/01/2023   CO2 21 04/01/2023    GLUCOSE 96 04/01/2023   BUN 13 04/01/2023   CREATININE 0.91 04/01/2023   CALCIUM  9.7 04/01/2023   MG 2.2 07/24/2022   Lab Results  Component Value Date   INR 2.3 08/09/2009   Lab Results  Component Value Date   CHOL 188 04/01/2023   HDL 62 04/01/2023   LDLCALC 104 (H) 04/01/2023   TRIG 128 04/01/2023   GEN- The patient is well appearing, alert and oriented x 3 today.   Neck - no JVD or carotid bruit noted Lungs- Clear to ausculation bilaterally, normal work of breathing Heart- Regular bradycardic rate and rhythm, no murmurs, rubs or gallops, PMI not laterally displaced Extremities- no clubbing, cyanosis, or edema Skin - no rash or ecchymosis noted   EKG-  Vent. rate 55 BPM PR interval 186 ms QRS duration 80 ms QT/QTcB 468/447 ms P-R-T axes 44 257 3 Sinus bradycardia with marked sinus arrhythmia Right superior axis deviation Abnormal ECG When compared with ECG of 26-Aug-2023 10:29, Premature atrial complexes are no longer Present T wave inversion now evident in Inferior leads  ECHO 07/11/21: 1. Left ventricular ejection fraction, by estimation, is 50 to 55%. The  left ventricle has low normal function. The left ventricle has no regional  wall motion abnormalities.   2. Right ventricular systolic function is normal. The right ventricular  size is normal.   3. No left atrial/left atrial appendage thrombus was detected. The LAA  emptying velocity was 51 cm/s.   4. The mitral valve is grossly normal. Mild to moderate mitral valve  regurgitation. No evidence of mitral stenosis.   5. The aortic valve is tricuspid. Aortic valve regurgitation is trivial.  No aortic stenosis is present.   6. There is mild (Grade II) layered plaque involving the descending aorta  and transverse aorta.   CHA2DS2-VASc Score = 2  The patient's score is based upon: CHF History: 0 HTN History: 1 Diabetes History: 0 Stroke History: 0 Vascular Disease History: 0 Age Score: 1 Gender Score:  0       ASSESSMENT AND PLAN: Paroxysmal Atrial  Fibrillation (ICD10:  I48.0) / Persistent atrial flutter The patient's CHA2DS2-VASc score is 2, indicating a 2.2% annual risk of stroke.   History of Afib ablation x 4.  He is currently in NSR.  High risk medication monitoring (ICD10: J342684) Patient requires ongoing monitoring for anti-arrhythmic medication which has the potential to cause life threatening arrhythmias or AV block. Qtc stable. Continue Tikosyn  250 mcg BID. Bmet and mag drawn today.  Secondary Hypercoagulable State (ICD10:  D68.69) The patient is at significant risk for stroke/thromboembolism based upon his CHA2DS2-VASc Score of 2.  Continue Rivaroxaban  (Xarelto ).  Continue Xarelto  20 mg daily.    Follow up 6 months for Tikosyn  surveillance.    Woody Heading, PA-C [facility] [address] [place], [place] [address] [phone]

## 2024-02-24 ENCOUNTER — Ambulatory Visit (HOSPITAL_COMMUNITY): Payer: Self-pay | Admitting: Internal Medicine

## 2024-02-24 DIAGNOSIS — L570 Actinic keratosis: Secondary | ICD-10-CM | POA: Diagnosis not present

## 2024-02-24 DIAGNOSIS — M25642 Stiffness of left hand, not elsewhere classified: Secondary | ICD-10-CM | POA: Diagnosis not present

## 2024-02-24 DIAGNOSIS — L821 Other seborrheic keratosis: Secondary | ICD-10-CM | POA: Diagnosis not present

## 2024-02-24 DIAGNOSIS — Z85828 Personal history of other malignant neoplasm of skin: Secondary | ICD-10-CM | POA: Diagnosis not present

## 2024-02-24 DIAGNOSIS — L814 Other melanin hyperpigmentation: Secondary | ICD-10-CM | POA: Diagnosis not present

## 2024-02-24 DIAGNOSIS — D225 Melanocytic nevi of trunk: Secondary | ICD-10-CM | POA: Diagnosis not present

## 2024-02-24 LAB — BASIC METABOLIC PANEL WITH GFR
BUN/Creatinine Ratio: 22 (ref 10–24)
BUN: 21 mg/dL (ref 8–27)
CO2: 19 mmol/L — ABNORMAL LOW (ref 20–29)
Calcium: 9.4 mg/dL (ref 8.6–10.2)
Chloride: 105 mmol/L (ref 96–106)
Creatinine, Ser: 0.95 mg/dL (ref 0.76–1.27)
Glucose: 95 mg/dL (ref 70–99)
Potassium: 4.1 mmol/L (ref 3.5–5.2)
Sodium: 142 mmol/L (ref 134–144)
eGFR: 85 mL/min/{1.73_m2} (ref >59)

## 2024-02-24 LAB — MAGNESIUM: Magnesium: 2.3 mg/dL (ref 1.6–2.3)

## 2024-02-26 DIAGNOSIS — S42124K Nondisplaced fracture of acromial process, right shoulder, subsequent encounter for fracture with nonunion: Secondary | ICD-10-CM | POA: Diagnosis not present

## 2024-02-26 DIAGNOSIS — S42141K Displaced fracture of glenoid cavity of scapula, right shoulder, subsequent encounter for fracture with nonunion: Secondary | ICD-10-CM | POA: Diagnosis not present

## 2024-02-27 ENCOUNTER — Ambulatory Visit: Payer: Self-pay | Admitting: Cardiovascular Disease

## 2024-03-01 NOTE — Addendum Note (Signed)
 Addended by: Edra Govern D on: 03/01/2024 03:13 PM   Modules accepted: Orders

## 2024-03-01 NOTE — Progress Notes (Signed)
 Carelink Summary Report / Loop Recorder

## 2024-03-02 DIAGNOSIS — M25642 Stiffness of left hand, not elsewhere classified: Secondary | ICD-10-CM | POA: Diagnosis not present

## 2024-03-09 DIAGNOSIS — M25642 Stiffness of left hand, not elsewhere classified: Secondary | ICD-10-CM | POA: Diagnosis not present

## 2024-03-16 DIAGNOSIS — M25642 Stiffness of left hand, not elsewhere classified: Secondary | ICD-10-CM | POA: Diagnosis not present

## 2024-03-20 ENCOUNTER — Other Ambulatory Visit: Payer: Self-pay | Admitting: Family Medicine

## 2024-03-23 DIAGNOSIS — T8522XA Displacement of intraocular lens, initial encounter: Secondary | ICD-10-CM | POA: Diagnosis not present

## 2024-03-23 DIAGNOSIS — I4891 Unspecified atrial fibrillation: Secondary | ICD-10-CM | POA: Diagnosis not present

## 2024-03-23 DIAGNOSIS — H52202 Unspecified astigmatism, left eye: Secondary | ICD-10-CM | POA: Diagnosis not present

## 2024-03-25 ENCOUNTER — Ambulatory Visit (INDEPENDENT_AMBULATORY_CARE_PROVIDER_SITE_OTHER)

## 2024-03-25 DIAGNOSIS — I4891 Unspecified atrial fibrillation: Secondary | ICD-10-CM | POA: Diagnosis not present

## 2024-03-25 DIAGNOSIS — M25642 Stiffness of left hand, not elsewhere classified: Secondary | ICD-10-CM | POA: Diagnosis not present

## 2024-03-25 LAB — CUP PACEART REMOTE DEVICE CHECK
Date Time Interrogation Session: 20250611230355
Implantable Pulse Generator Implant Date: 20201228

## 2024-03-27 ENCOUNTER — Ambulatory Visit: Payer: Self-pay | Admitting: Cardiovascular Disease

## 2024-03-30 DIAGNOSIS — M25642 Stiffness of left hand, not elsewhere classified: Secondary | ICD-10-CM | POA: Diagnosis not present

## 2024-04-01 ENCOUNTER — Encounter: Payer: Self-pay | Admitting: Family Medicine

## 2024-04-01 ENCOUNTER — Ambulatory Visit (INDEPENDENT_AMBULATORY_CARE_PROVIDER_SITE_OTHER): Admitting: Family Medicine

## 2024-04-01 VITALS — BP 126/88 | HR 76 | Ht 68.0 in | Wt 181.8 lb

## 2024-04-01 DIAGNOSIS — Z860100 Personal history of colon polyps, unspecified: Secondary | ICD-10-CM

## 2024-04-01 DIAGNOSIS — E782 Mixed hyperlipidemia: Secondary | ICD-10-CM | POA: Diagnosis not present

## 2024-04-01 DIAGNOSIS — Z0000 Encounter for general adult medical examination without abnormal findings: Secondary | ICD-10-CM | POA: Diagnosis not present

## 2024-04-01 DIAGNOSIS — K219 Gastro-esophageal reflux disease without esophagitis: Secondary | ICD-10-CM

## 2024-04-01 DIAGNOSIS — Z8042 Family history of malignant neoplasm of prostate: Secondary | ICD-10-CM | POA: Diagnosis not present

## 2024-04-01 DIAGNOSIS — Z8619 Personal history of other infectious and parasitic diseases: Secondary | ICD-10-CM

## 2024-04-01 DIAGNOSIS — I10 Essential (primary) hypertension: Secondary | ICD-10-CM | POA: Diagnosis not present

## 2024-04-01 DIAGNOSIS — L649 Androgenic alopecia, unspecified: Secondary | ICD-10-CM | POA: Diagnosis not present

## 2024-04-01 DIAGNOSIS — Z9842 Cataract extraction status, left eye: Secondary | ICD-10-CM

## 2024-04-01 DIAGNOSIS — I4819 Other persistent atrial fibrillation: Secondary | ICD-10-CM

## 2024-04-01 DIAGNOSIS — D6869 Other thrombophilia: Secondary | ICD-10-CM | POA: Diagnosis not present

## 2024-04-01 LAB — COMPREHENSIVE METABOLIC PANEL WITH GFR
ALT: 17 IU/L (ref 0–44)
AST: 17 IU/L (ref 0–40)
Albumin: 4.3 g/dL (ref 3.8–4.8)
Alkaline Phosphatase: 99 IU/L (ref 44–121)
BUN/Creatinine Ratio: 18 (ref 10–24)
BUN: 17 mg/dL (ref 8–27)
Bilirubin Total: 0.6 mg/dL (ref 0.0–1.2)
CO2: 17 mmol/L — ABNORMAL LOW (ref 20–29)
Calcium: 9.3 mg/dL (ref 8.6–10.2)
Chloride: 107 mmol/L — ABNORMAL HIGH (ref 96–106)
Creatinine, Ser: 0.95 mg/dL (ref 0.76–1.27)
Globulin, Total: 2.1 g/dL (ref 1.5–4.5)
Glucose: 100 mg/dL — ABNORMAL HIGH (ref 70–99)
Potassium: 3.9 mmol/L (ref 3.5–5.2)
Sodium: 139 mmol/L (ref 134–144)
Total Protein: 6.4 g/dL (ref 6.0–8.5)
eGFR: 85 mL/min/{1.73_m2} (ref >59)

## 2024-04-01 LAB — CBC WITH DIFFERENTIAL/PLATELET
Basophils Absolute: 0 10*3/uL (ref 0.0–0.2)
Basos: 0 %
EOS (ABSOLUTE): 0.1 10*3/uL (ref 0.0–0.4)
Eos: 2 %
Hematocrit: 46.1 % (ref 37.5–51.0)
Hemoglobin: 15.1 g/dL (ref 13.0–17.7)
Immature Grans (Abs): 0 10*3/uL (ref 0.0–0.1)
Immature Granulocytes: 0 %
Lymphocytes Absolute: 2.6 10*3/uL (ref 0.7–3.1)
Lymphs: 45 %
MCH: 28.5 pg (ref 26.6–33.0)
MCHC: 32.8 g/dL (ref 31.5–35.7)
MCV: 87 fL (ref 79–97)
Monocytes Absolute: 0.6 10*3/uL (ref 0.1–0.9)
Monocytes: 11 %
Neutrophils Absolute: 2.5 10*3/uL (ref 1.4–7.0)
Neutrophils: 42 %
Platelets: 199 10*3/uL (ref 150–450)
RBC: 5.3 x10E6/uL (ref 4.14–5.80)
RDW: 13.2 % (ref 11.6–15.4)
WBC: 5.8 10*3/uL (ref 3.4–10.8)

## 2024-04-01 LAB — LIPID PANEL
Chol/HDL Ratio: 3.2 ratio (ref 0.0–5.0)
Cholesterol, Total: 175 mg/dL (ref 100–199)
HDL: 54 mg/dL (ref >39)
LDL Chol Calc (NIH): 98 mg/dL (ref 0–99)
Triglycerides: 133 mg/dL (ref 0–149)
VLDL Cholesterol Cal: 23 mg/dL (ref 5–40)

## 2024-04-01 MED ORDER — FINASTERIDE 5 MG PO TABS: 5.0000 mg | ORAL_TABLET | Freq: Every day | ORAL | 3 refills | Status: DC

## 2024-04-01 MED ORDER — OMEPRAZOLE 40 MG PO CPDR: 40.0000 mg | DELAYED_RELEASE_CAPSULE | Freq: Every day | ORAL | 3 refills | Status: AC | PRN

## 2024-04-01 NOTE — Progress Notes (Signed)
 Maurice [name] is a 73 y.o. male who presents for annual wellness visit,CPE and follow-up on chronic medical conditions.  He does have underlying atrial fibrillation and is also on Xarelto  and Tikosyn .  He is followed regularly by cardiology.  He continues on finasteride  and have no trouble with that.  Also is taking atorvastatin .  He does have reflux disease and is taking Prilosec on a regular basis.  He has seen gastroenterology for follow-up on this and is also had a colonoscopy.  Does have a family history of prostate cancer and wants follow-up PSA.  He does have herpes labialis but does have enough antiviral will last him for a while.   Immunizations and Health Maintenance Immunization History  Administered Date(s) Administered   Fluad Quad(high Dose 65+) 07/15/2019, 08/02/2021   Hepatitis A 02/15/2009   IPV 10/14/1962   Influenza Split 07/23/2013, 07/22/2015   Influenza, High Dose Seasonal PF 07/25/2017, 09/25/2018, 07/30/2022, 08/01/2023   Influenza-Unspecified 07/28/2014, 07/26/2016, 07/16/2021   MMR 10/15/1959, 09/23/1988   Meningococcal polysaccharide vaccine (MPSV4) 07/23/2013   Moderna Covid-19 Fall Seasonal Vaccine 9yrs & older 08/01/2023   PFIZER(Purple Top)SARS-COV-2 Vaccination 11/18/2019, 12/09/2019, 05/08/2020, 02/22/2021   Pfizer Covid-19 Vaccine Bivalent Booster 36yrs & up 08/02/2021, 07/30/2022   Pneumococcal Conjugate-13 11/01/2016   Pneumococcal Polysaccharide-23 11/13/2017   Rsv, Bivalent, Protein Subunit Rsvpref,pf Pattricia Bores) 08/01/2023   Tdap 07/30/2007, 11/03/2016   Zoster Recombinant(Shingrix) 03/17/2018, 05/19/2018   Zoster, Live 05/18/2013   Health Maintenance Due  Topic Date Due   COVID-19 Vaccine (8 - Pfizer risk 2024-25 season) 01/30/2024    Last colonoscopy: August 2024 Last PSA: 2024 Dentist: Louann Rous 2025    Ophtho: June 2025 Exercise: yes. Spin classes 3 times week. Yoga as well   Other doctors caring for patient include:Mann  Mealor    Advanced Directives: Does Patient Have a Programmer, multimedia?: Yes Type of Advance Directive: Healthcare Power of Attorney, Living will, Out of facility DNR (pink MOST or yellow form) Does patient want to make changes to medical advance directive?: Yes (Inpatient - patient defers changing a medical advance directive at this time - Information given) Copy of Healthcare Power of Attorney in Chart?: Yes - validated most recent copy scanned in chart (See row information)  Depression screen:  See questionnaire below.        04/01/2024    2:43 PM 04/01/2023    8:18 AM 03/18/2022   10:09 AM 03/15/2021   11:10 AM 03/07/2021    8:39 AM  Depression screen PHQ 2/9  Decreased Interest 0 0 0 0 0  Down, Depressed, Hopeless 0 0 0 0 0  PHQ - 2 Score 0 0 0 0 0    Fall Screen: See Questionaire below.      04/01/2024    2:42 PM 04/01/2023    8:18 AM 03/18/2022   10:09 AM 03/15/2021   11:10 AM 03/06/2020    8:36 AM  Fall Risk   Falls in the past year? 0 0 0 0 0  Number falls in past yr: 0 0 0 0   Injury with Fall? 0 0 0 1   Risk for fall due to : No Fall Risks No Fall Risks No Fall Risks No Fall Risks   Follow up Falls evaluation completed Falls evaluation completed Falls evaluation completed  Falls evaluation completed       Data saved with a previous flowsheet row definition    ADL screen:  See questionnaire below.  Functional Status Survey: Is the patient  deaf or have difficulty hearing?: No Does the patient have difficulty seeing, even when wearing glasses/contacts?: No Does the patient have difficulty concentrating, remembering, or making decisions?: No Does the patient have difficulty walking or climbing stairs?: No Does the patient have difficulty dressing or bathing?: No Does the patient have difficulty doing errands alone such as visiting a doctor's office or shopping?: No   Review of Systems  Constitutional: -, -unexpected weight change, -anorexia, -fatigue Allergy: -sneezing,  -itching, -congestion Dermatology: denies changing moles, rash, lumps ENT: -runny nose, -ear pain, -sore throat,  Cardiology:  -chest pain, -palpitations, -orthopnea, Respiratory: -cough, -shortness of breath, -dyspnea on exertion, -wheezing,  Gastroenterology: -abdominal pain, -nausea, -vomiting, -diarrhea, -constipation, -dysphagia Hematology: -bleeding or bruising problems Musculoskeletal: -arthralgias, -myalgias, -joint swelling, -back pain, - Ophthalmology: -vision changes,  Urology: -dysuria, -difficulty urinating,  -urinary frequency, -urgency, incontinence Neurology: -, -numbness, , -memory loss, -falls, -dizziness    PHYSICAL EXAM:  BP 126/88   Pulse 76   Ht 5' 8 (1.727 m)   Wt 181 lb 12.8 oz (82.5 kg)   SpO2 97%   BMI 27.64 kg/m   General Appearance: Alert, cooperative, no distress, appears stated age Head: Normocephalic, without obvious abnormality, atraumatic Eyes: PERRL, conjunctiva/corneas clear, EOM's intact,  Ears: Normal TM's and external ear canals Nose: Nares normal, mucosa normal, no drainage or sinus   tenderness Throat: Lips, mucosa, and tongue normal; teeth and gums normal Neck: Supple, no lymphadenopathy, thyroid :no enlargement/tenderness/nodules; no carotid bruit or JVD Lungs: Clear to auscultation bilaterally without wheezes, rales or ronchi; respirations unlabored Heart: Regular rate and rhythm, S1 and S2 normal, no murmur, rub or gallop Skin: Skin color, texture, turgor normal, no rashes or lesions Lymph nodes: Cervical, supraclavicular, and axillary nodes normal Neurologic: CNII-XII intact, normal strength, sensation and gait; reflexes 2+ and symmetric throughout   Psych: Normal mood, affect, hygiene and grooming  ASSESSMENT/PLAN: Routine general medical examination at a health care facility  Secondary hypercoagulable state (HCC)  Persistent atrial fibrillation (HCC)  Male pattern baldness - Plan: finasteride  (PROSCAR ) 5 MG tablet  Mixed  hyperlipidemia - Plan: Lipid panel  History of colonic polyps  Gastroesophageal reflux disease without esophagitis - Plan: omeprazole  (PRILOSEC) 40 MG capsule  Family history of prostate cancer - Plan: PSA  ESSENTIAL HYPERTENSION, BENIGN - Plan: CBC with Differential/Platelet, Comprehensive metabolic panel with GFR  History of left cataract surgery  History of herpes labialis   He will continue on the medication that he is presently on.  He will follow-up with ophthalmology concerning having his other cataract done.  Discussed using Prilosec on an as-needed basis.  Immunization recommendations discussed.  Colonoscopy recommendations reviewed.   Medicare Attestation I have personally reviewed: The patient's medical and social history Their use of alcohol, tobacco or illicit drugs Their current medications and supplements The patient's functional ability including ADLs,fall risks, home safety risks, cognitive, and hearing and visual impairment Diet and physical activities Evidence for depression or mood disorders  The patient's weight, height, and BMI have been recorded in the chart.  I have made referrals, counseling, and provided education to the patient based on review of the above and I have provided the patient with a written personalized care plan for preventive services.     Ron Cobbs, MD   04/01/2024

## 2024-04-02 ENCOUNTER — Ambulatory Visit: Payer: Self-pay | Admitting: Family Medicine

## 2024-04-07 NOTE — Progress Notes (Signed)
 Carelink Summary Report / Loop Recorder

## 2024-04-08 DIAGNOSIS — M25642 Stiffness of left hand, not elsewhere classified: Secondary | ICD-10-CM | POA: Diagnosis not present

## 2024-04-08 LAB — SPECIMEN STATUS REPORT

## 2024-04-08 LAB — PSA: Prostate Specific Ag, Serum: 0.2 ng/mL (ref 0.0–4.0)

## 2024-04-14 DIAGNOSIS — I480 Paroxysmal atrial fibrillation: Secondary | ICD-10-CM | POA: Diagnosis not present

## 2024-04-26 ENCOUNTER — Ambulatory Visit

## 2024-04-26 DIAGNOSIS — I4891 Unspecified atrial fibrillation: Secondary | ICD-10-CM

## 2024-04-26 DIAGNOSIS — M1812 Unilateral primary osteoarthritis of first carpometacarpal joint, left hand: Secondary | ICD-10-CM | POA: Diagnosis not present

## 2024-04-26 LAB — CUP PACEART REMOTE DEVICE CHECK
Date Time Interrogation Session: 20250713231546
Implantable Pulse Generator Implant Date: 20201228

## 2024-04-27 DIAGNOSIS — H16011 Central corneal ulcer, right eye: Secondary | ICD-10-CM | POA: Diagnosis not present

## 2024-04-28 ENCOUNTER — Ambulatory Visit: Payer: Self-pay | Admitting: Cardiovascular Disease

## 2024-04-28 DIAGNOSIS — H16011 Central corneal ulcer, right eye: Secondary | ICD-10-CM | POA: Diagnosis not present

## 2024-05-18 NOTE — Progress Notes (Signed)
 Carelink Summary Report / Loop Recorder

## 2024-05-27 ENCOUNTER — Ambulatory Visit (INDEPENDENT_AMBULATORY_CARE_PROVIDER_SITE_OTHER)

## 2024-05-27 DIAGNOSIS — I4891 Unspecified atrial fibrillation: Secondary | ICD-10-CM | POA: Diagnosis not present

## 2024-05-27 LAB — CUP PACEART REMOTE DEVICE CHECK
Date Time Interrogation Session: 20250814135555
Implantable Pulse Generator Implant Date: 20201228

## 2024-06-02 DIAGNOSIS — L570 Actinic keratosis: Secondary | ICD-10-CM | POA: Diagnosis not present

## 2024-06-02 DIAGNOSIS — L820 Inflamed seborrheic keratosis: Secondary | ICD-10-CM | POA: Diagnosis not present

## 2024-06-03 ENCOUNTER — Ambulatory Visit: Payer: Self-pay | Admitting: Cardiovascular Disease

## 2024-06-20 ENCOUNTER — Other Ambulatory Visit: Payer: Self-pay | Admitting: Family Medicine

## 2024-06-20 DIAGNOSIS — L649 Androgenic alopecia, unspecified: Secondary | ICD-10-CM

## 2024-06-28 ENCOUNTER — Ambulatory Visit (INDEPENDENT_AMBULATORY_CARE_PROVIDER_SITE_OTHER)

## 2024-06-28 DIAGNOSIS — I4891 Unspecified atrial fibrillation: Secondary | ICD-10-CM

## 2024-06-28 LAB — CUP PACEART REMOTE DEVICE CHECK
Date Time Interrogation Session: 20250913230404
Implantable Pulse Generator Implant Date: 20201228

## 2024-07-03 ENCOUNTER — Ambulatory Visit: Payer: Self-pay | Admitting: Cardiovascular Disease

## 2024-07-05 NOTE — Progress Notes (Signed)
 Remote Loop Recorder Transmission

## 2024-07-08 NOTE — Progress Notes (Signed)
 Remote Loop Recorder Transmission

## 2024-07-19 ENCOUNTER — Telehealth: Payer: Self-pay

## 2024-07-19 NOTE — Telephone Encounter (Signed)
 Ordering provider: Dr. Nancey  Associated diagnoses: Paroxysmal atrial fibrillation (HCC) [I48.0] Persistent atrial fibrillation (HCC) [I48.19]  Patient NOT notified of PIN (1234) on 07/19/2024.  Called patient and the patient notified me that he has had surgery on his shoulder and that it has not been a good time for him to do the sleep study. Patient stated that he has an appointment with Dr. Nancey the 2nd week of November and should  have decided if he will do the study by then. If he decides not to do it he will bring the device in on his appointment day.  Phone note routed to covering staff for follow-up.

## 2024-07-21 DIAGNOSIS — Z471 Aftercare following joint replacement surgery: Secondary | ICD-10-CM | POA: Diagnosis not present

## 2024-07-21 DIAGNOSIS — Z96611 Presence of right artificial shoulder joint: Secondary | ICD-10-CM | POA: Diagnosis not present

## 2024-07-21 NOTE — Progress Notes (Signed)
 Remote Loop Recorder Transmission

## 2024-07-28 ENCOUNTER — Ambulatory Visit (INDEPENDENT_AMBULATORY_CARE_PROVIDER_SITE_OTHER)

## 2024-07-28 DIAGNOSIS — I4891 Unspecified atrial fibrillation: Secondary | ICD-10-CM | POA: Diagnosis not present

## 2024-07-29 ENCOUNTER — Encounter

## 2024-07-29 LAB — CUP PACEART REMOTE DEVICE CHECK
Date Time Interrogation Session: 20251014230523
Implantable Pulse Generator Implant Date: 20201228

## 2024-08-02 NOTE — Progress Notes (Signed)
 Remote Loop Recorder Transmission

## 2024-08-09 ENCOUNTER — Ambulatory Visit: Payer: Self-pay | Admitting: Cardiovascular Disease

## 2024-08-15 ENCOUNTER — Other Ambulatory Visit: Payer: Self-pay | Admitting: Cardiovascular Disease

## 2024-08-15 DIAGNOSIS — I480 Paroxysmal atrial fibrillation: Secondary | ICD-10-CM

## 2024-08-16 NOTE — Telephone Encounter (Signed)
 Attempted phone call to pt and left detailed voicemail message (OK per EPIC) regarding completing sleep study.  Pt advised to contact office at [phone].

## 2024-08-17 NOTE — Telephone Encounter (Signed)
 Xarelto  20mg  refill request received. Pt is 73 years old, weight-82.5kg, Crea-0.95 on 04/01/24, last seen by Fairy Heinrich on 02/23/24, Diagnosis-Afib, CrCl-80.81 mL/min; Dose is appropriate based on dosing criteria. Will send in refill to requested pharmacy.

## 2024-08-17 NOTE — Telephone Encounter (Signed)
 Pt returning call to a nurse

## 2024-08-19 ENCOUNTER — Encounter: Payer: Self-pay | Admitting: Cardiovascular Disease

## 2024-08-19 ENCOUNTER — Encounter (HOSPITAL_BASED_OUTPATIENT_CLINIC_OR_DEPARTMENT_OTHER): Payer: Self-pay | Admitting: Cardiology

## 2024-08-19 DIAGNOSIS — G4719 Other hypersomnia: Secondary | ICD-10-CM

## 2024-08-21 ENCOUNTER — Other Ambulatory Visit: Payer: Self-pay | Admitting: Cardiovascular Disease

## 2024-08-23 ENCOUNTER — Ambulatory Visit (HOSPITAL_COMMUNITY)
Admission: RE | Admit: 2024-08-23 | Discharge: 2024-08-23 | Disposition: A | Discharge status: Home / Self Care | Source: Ambulatory Visit | Attending: Internal Medicine | Admitting: Internal Medicine

## 2024-08-23 ENCOUNTER — Encounter (HOSPITAL_COMMUNITY): Payer: Self-pay | Admitting: Internal Medicine

## 2024-08-23 VITALS — BP 126/88 | HR 53 | Ht 68.0 in | Wt 184.0 lb

## 2024-08-23 DIAGNOSIS — Z79899 Other long term (current) drug therapy: Secondary | ICD-10-CM

## 2024-08-23 DIAGNOSIS — Z5181 Encounter for therapeutic drug level monitoring: Secondary | ICD-10-CM

## 2024-08-23 DIAGNOSIS — D6869 Other thrombophilia: Secondary | ICD-10-CM

## 2024-08-23 DIAGNOSIS — I4819 Other persistent atrial fibrillation: Secondary | ICD-10-CM | POA: Diagnosis not present

## 2024-08-23 MED ORDER — DOFETILIDE 250 MCG PO CAPS: 250.0000 ug | ORAL_CAPSULE | Freq: Two times a day (BID) | ORAL | 3 refills | Status: AC

## 2024-08-23 NOTE — Progress Notes (Signed)
 Primary Care Physician: Joyce Norleen BROCKS, MD Referring Physician: Dr. Kelsie Elna DELENA Maurice [name] is a 73 y.o. male with a h/o paroxysmal afib that underwent afib ablation x 4, 2 in 2014, one in 2017 and last ablation in 2021.  Patient noted elevated heart rates for the past two weeks. ILR shows an increased afib burden since the end of August. ? If the device is undersensing atrial flutter. He was started on diltiazem  and Xarelto  on 06/29/21 and his heart racing  resolved with last visit. He then went into persistent atrial flutter with RVR, rate today 156 bpm since 9/22. He feels weak and miserable. He takes 120 mg cadizem daily and has been taking prn cardizem  on a regular basis since in rapid flutter, but has touched his rate.  He has only been back on anticoagulation x 6 days so will require TEE/CV so sending hin to ED for urgent cardioversion is not an option.  There were no specific triggers that he could identify. CHA2DS2VASc  score is 2 so he really should stay on anticoagulation long term by guidelinies. BP adequate at 114/90.   Pt returns  today, 07/17/21 for return of atrial flutter at 150 bpm. Cardioversion lasted 4 days. Right now he seems to be tolerating. I am afraid to increase daily Cardizem  as it it dropped BP last week when out of rhythm. I discussed with Dr. Kelsie how to approach. He suggested 600 mg propafenone  today and then 225 mg bid and return to clinic for ekg on Thursday. If this fails then possibly tikosyn ,  but Dr. Kelsie  is not in favor of another ablation. Pt is on young  side for amiodarone.   F/u in afib clinic for start of Rythmol , 07/19/21.The original plan was to take 300 mg of Rythmol  and then start 225 mg ER bid the next day. The pharmacy did not have either dose but could get the 225 mg of Rythmol  before he could get the 600 mg dose, would not be for several days. So he just started the 225 mg dose and went back into SR this am, after dose last pm and this am  dose. He is feeling improved, but a little lightheaded  this pm.   Pt being seen in the afib clinic, 10/13,  as he reported going back out of rhythm on Monday, confirmed with Linq and EKG shows rapid aflutter. He is on extended release propafenone  225 mg bid . I discussed with Dr. Kelsie who suggested to try to increase to 325 mg bid and bring in on Friday for EKG. Unfortunately, the 2 drugstores where the drug was least expensive, would take 2-3 days to get the drug in. Alsp, he feels very lightheaded on drug, even in SR and I was afraid that he would not tolerate the higher dose. Therefore, will washout drug x 3 days, none after tomorrow and will plan for tikosyn  admit next Monday. No benadryl  use, no missed anticoagulation and qt in SR was 421 ms.   Follow up in the AF clinic 07/30/21. Patient presents for dofetilide  admission. He stopped propafenone  on 07/26/21. He has not missed any doses of anticoagulation in the last 3 weeks. He actually converted to SR 07/28/21 and feels well today.   F/u in afib clinic, 08/09/21. He is now on tikosyn  250 mcg bid. He is in sinus brady running slightly lower today in the 40's. At home via his apple watch, he has been in the 50's  bpm range. He noted some afib over the weekend. He is being complaint with tikosyn .   On follow up 02/23/24, he is here for Tikosyn  surveillance. He is currently in NSR. Recent ILR review by Dr. Nancey in April showed 0.4% Afib burden. He is on Tikosyn  250 mcg BID. He is doing well overall. He is recovering from left thumb surgery. No missed doses of Tikosyn  or Xarelto .   On follow up 08/23/24, patient is here for Tikosyn  surveillance. Review of ILR shows increased burden in the month of October. He has had one episode of Afib on 11/4. Patient is not sure if clear trigger for Afib. No missed doses of Tikosyn  or Xarelto .   Today, he denies symptoms of palpitations, chest pain, shortness of breath, orthopnea, PND, lower extremity edema,  dizziness, presyncope, syncope, or neurologic sequela. The patient is tolerating medications without difficulties and is otherwise without complaint today.   Past Medical History:  Diagnosis Date   Atrial fibrillation (HCC)    paroxysmal s/p PVI by JA 2010, 2014, 2017   Atypical mole 06/22/2013   mild - r chest   Atypical mole 08/23/2014   moderate - l paraspinal   Atypical mole 03/26/2016   mild- R lower back (recheck evert )   Basal cell carcinoma of skin 07/16/2018   Nod-R Nare (MOHS)   Diverticulosis    Colonoscopy 07/2013   Herpes    History of hepatitis B    Hypercholesterolemia    Squamous cell carcinoma of skin of right thumb 08/28/2014   insitu - R Thumb - currett, cautery   Varicose veins    Past Surgical History:  Procedure Laterality Date   atrail fibrillation ablation  7/10, 1//17/14   PVI by JA   ATRIAL FIBRILLATION ABLATION N/A 10/29/2012   Procedure: ATRIAL FIBRILLATION ABLATION;  Surgeon: Lynwood Rakers, MD;  Location: [facility] CATH LAB;  Service: Cardiovascular;  Laterality: N/A;   ATRIAL FIBRILLATION ABLATION N/A 03/24/2020   Procedure: ATRIAL FIBRILLATION ABLATION;  Surgeon: Rakers Lynwood, MD;  Location: [facility] INVASIVE CV LAB;  Service: Cardiovascular;  Laterality: N/A;   CARDIOVERSION N/A 09/20/2015   Procedure: CARDIOVERSION;  Surgeon: Aleene JINNY Passe, MD;  Location: [facility] ENDOSCOPY;  Service: Cardiovascular;  Laterality: N/A;   CARDIOVERSION N/A 09/28/2015   Procedure: CARDIOVERSION;  Surgeon: Redell GORMAN Shallow, MD;  Location: [facility] ENDOSCOPY;  Service: Cardiovascular;  Laterality: N/A;   CARDIOVERSION N/A 07/11/2021   Procedure: CARDIOVERSION;  Surgeon: Barbaraann Darryle Ned, MD;  Location: [facility] ENDOSCOPY;  Service: Cardiovascular;  Laterality: N/A;   COLONOSCOPY  2004   ELECTROPHYSIOLOGIC STUDY N/A 11/28/2015   3rd AF ablation by Dr Rakers   implantable loop recorder placement  10/11/2019   Medtronic Reveal [place] model LNQ22 (SN [id number]) by Dr Rakers for afib  management post ablation   SKIN BIOPSY Right 11/03/2018   TEE WITHOUT CARDIOVERSION  10/29/2012   Procedure: TRANSESOPHAGEAL ECHOCARDIOGRAM (TEE);  Surgeon: Vina LULLA Gull, MD;  Location: [facility] ENDOSCOPY;  Service: Cardiovascular;  Laterality: N/A;  pre ablation   TEE WITHOUT CARDIOVERSION N/A 09/20/2015   Procedure: TRANSESOPHAGEAL ECHOCARDIOGRAM (TEE);  Surgeon: Aleene JINNY Passe, MD;  Location: [facility] ENDOSCOPY;  Service: Cardiovascular;  Laterality: N/A;   TEE WITHOUT CARDIOVERSION N/A 07/11/2021   Procedure: TRANSESOPHAGEAL ECHOCARDIOGRAM (TEE);  Surgeon: Barbaraann Darryle Ned, MD;  Location: [facility] ENDOSCOPY;  Service: Cardiovascular;  Laterality: N/A;   TONSILLECTOMY     when child   TOTAL SHOULDER REPLACEMENT Right     Current Outpatient Medications  Medication Sig  Dispense Refill   acyclovir  (ZOVIRAX ) 400 MG tablet TAKE 1 TABLET BY MOUTH THREE TIMES DAILY AS NEEDED AS DIRECTED 30 tablet 5   diltiazem  (CARDIZEM  CD) 180 MG 24 hr capsule TAKE 1 CAPSULE BY MOUTH EVERY DAY 90 capsule 2   finasteride  (PROSCAR ) 5 MG tablet TAKE 1 TABLET(5 MG) BY MOUTH DAILY 90 tablet 3   omeprazole  (PRILOSEC) 40 MG capsule Take 1 capsule (40 mg total) by mouth daily as needed (heartburn or acid reflux). 90 capsule 3   potassium chloride  (KLOR-CON ) 10 MEQ tablet TAKE 1 TABLET(10 MEQ) BY MOUTH DAILY 90 tablet 2   rivaroxaban  (XARELTO ) 20 MG TABS tablet TAKE 1 TABLET(20 MG) BY MOUTH DAILY WITH SUPPER 90 tablet 2   rosuvastatin  (CRESTOR ) 40 MG tablet TAKE 1 TABLET(40 MG) BY MOUTH DAILY 90 tablet 3   dofetilide  (TIKOSYN ) 250 MCG capsule Take 1 capsule (250 mcg total) by mouth 2 (two) times daily. 180 capsule 3   No current facility-administered medications for this encounter.    No Known Allergies  ROS- All systems are reviewed and negative except as per the HPI above  Physical Exam: Vitals:   08/23/24 0823  BP: 126/88  Pulse: (!) 53  Weight: 83.5 kg  Height: 5' 8 (1.727 m)     Wt Readings from Last 3  Encounters:  08/23/24 83.5 kg  04/01/24 82.5 kg  02/23/24 81.4 kg    Labs: Lab Results  Component Value Date   NA 139 04/01/2024   K 3.9 04/01/2024   CL 107 (H) 04/01/2024   CO2 17 (L) 04/01/2024   GLUCOSE 100 (H) 04/01/2024   BUN 17 04/01/2024   CREATININE 0.95 04/01/2024   CALCIUM  9.3 04/01/2024   MG 2.3 02/23/2024   Lab Results  Component Value Date   INR 2.3 08/09/2009   Lab Results  Component Value Date   CHOL 175 04/01/2024   HDL 54 04/01/2024   LDLCALC 98 04/01/2024   TRIG 133 04/01/2024   GEN- The patient is well appearing, alert and oriented x 3 today.   Neck - no JVD or carotid bruit noted Lungs- Clear to ausculation bilaterally, normal work of breathing Heart- Regular bradycardic rate and rhythm, no murmurs, rubs or gallops, PMI not laterally displaced Extremities- no clubbing, cyanosis, or edema Skin - no rash or ecchymosis noted   EKG-  Vent. rate 53 BPM PR interval 180 ms QRS duration 86 ms QT/QTcB 478/448 ms P-R-T axes 44 -52 32 Sinus bradycardia with sinus arrhythmia Left anterior fascicular block Abnormal ECG When compared with ECG of 23-Feb-2024 08:29, No significant change was found  ECHO 07/11/21: 1. Left ventricular ejection fraction, by estimation, is 50 to 55%. The  left ventricle has low normal function. The left ventricle has no regional  wall motion abnormalities.   2. Right ventricular systolic function is normal. The right ventricular  size is normal.   3. No left atrial/left atrial appendage thrombus was detected. The LAA  emptying velocity was 51 cm/s.   4. The mitral valve is grossly normal. Mild to moderate mitral valve  regurgitation. No evidence of mitral stenosis.   5. The aortic valve is tricuspid. Aortic valve regurgitation is trivial.  No aortic stenosis is present.   6. There is mild (Grade II) layered plaque involving the descending aorta  and transverse aorta.   CHA2DS2-VASc Score = 2  The patient's score is  based upon: CHF History: 0 HTN History: 1 Diabetes History: 0 Stroke History: 0 Vascular Disease  History: 0 Age Score: 1 Gender Score: 0      ASSESSMENT AND PLAN: Paroxysmal Atrial Fibrillation (ICD10:  I48.0) / Persistent atrial flutter The patient's CHA2DS2-VASc score is 2, indicating a 2.2% annual risk of stroke.   History of Afib ablation x 4.  Patient is currently in NSR. Will review ILR in one month to trend recent increase in burden in the month of October. So far, it appears patient has had one Afib episode on 11/4.   High risk medication monitoring (ICD10: J342684) Patient requires ongoing monitoring for anti-arrhythmic medication which has the potential to cause life threatening arrhythmias or AV block. Qtc stable. Continue Tikosyn  250 mcg BID. Bmet and mag drawn today.   Secondary Hypercoagulable State (ICD10:  D68.69) The patient is at significant risk for stroke/thromboembolism based upon his CHA2DS2-VASc Score of 2.  Continue Rivaroxaban  (Xarelto ).  Continue Xarelto .     Follow up 6 months for Tikosyn  surveillance with EP, one year with Afib clinic.   Dorn Heinrich, PA-C Afib Clinic [facility] [address] [place], [place] [address] [phone]

## 2024-08-24 ENCOUNTER — Ambulatory Visit (HOSPITAL_COMMUNITY): Payer: Self-pay | Admitting: Internal Medicine

## 2024-08-24 LAB — BASIC METABOLIC PANEL WITH GFR
BUN/Creatinine Ratio: 17 (ref 10–24)
BUN: 18 mg/dL (ref 8–27)
CO2: 19 mmol/L — ABNORMAL LOW (ref 20–29)
Calcium: 9.1 mg/dL (ref 8.6–10.2)
Chloride: 103 mmol/L (ref 96–106)
Creatinine, Ser: 1.07 mg/dL (ref 0.76–1.27)
Glucose: 95 mg/dL (ref 70–99)
Potassium: 4.3 mmol/L (ref 3.5–5.2)
Sodium: 140 mmol/L (ref 134–144)
eGFR: 73 mL/min/1.73 (ref >59)

## 2024-08-24 LAB — MAGNESIUM: Magnesium: 2.3 mg/dL (ref 1.6–2.3)

## 2024-08-24 NOTE — Procedures (Signed)
    SLEEP STUDY REPORT Patient Information Study Date: 08/19/2024 Patient Name: Maurice [name] Patient ID: [id number] Birth Date: 18-Jul-1951 Age: 73 Gender: Male BMI: 27.4 (W=181 lb, H=5' 8'') Stopbang: 5 Referring Physician: Eulas Furbish, MD  TEST DESCRIPTION: Home sleep apnea testing was completed using the WatchPat, a Type 1 device, utilizing peripheral arterial tonometry (PAT), chest movement, actigraphy, pulse oximetry, pulse rate, body position and snore. AHI was calculated with apnea and hypopnea using valid sleep time as the denominator. RDI includes apneas, hypopneas, and RERAs. The data acquired and the scoring of sleep and all associated events were performed in accordance with the recommended standards and specifications as outlined in the AASM Manual for the Scoring of Sleep and Associated Events 2.2.0 (2015).  FINDINGS: 1. No evidence of Obstructive Sleep Apnea with AHI 1.5/hr. 2. No Central Sleep Apnea. 3. Oxygen desaturations as low as 85%. 4. Mild to moderate snoring was present. O2 sats were < 88% for 1.1 minutes. 5. Total sleep time was 7 hrs and 20 min. 6. 22.9% of total sleep time was spent in REM sleep. 7. Normal sleep onset latency at 18 min. 8. Shortened REM sleep onset latency at 32 min. 9. Total awakenings were 7.  DIAGNOSIS: Normal study with no significant sleep disordered breathing.  RECOMMENDATIONS: 1. Normal study with no significant sleep disordered breathing. 2. Healthy sleep recommendations include: adequate nightly sleep (normal 7-9 hrs/night), avoidance of caffeine after noon and alcohol near bedtime, and maintaining a sleep environment that is cool, dark and quiet. 3. Weight loss for overweight patients is recommended. 4. Snoring recommendations include: weight loss where appropriate, side sleeping, and avoidance of alcohol before bed. 5. Operation of motor vehicle or dangerous equipment must be avoided when feeling drowsy,  excessively sleepy, or mentally fatigued. 6. An ENT consultation which may be useful for specific causes of and possible treatment of bothersome snoring . 7. Weight loss may be of benefit in reducing the severity of snoring.   Signature: Wilbert Bihari, MD; [facility]; Diplomat, American Board of Sleep Medicine Electronically Signed: 08/24/2024 7:19:30 PM

## 2024-08-27 DIAGNOSIS — R051 Acute cough: Secondary | ICD-10-CM | POA: Diagnosis not present

## 2024-08-27 DIAGNOSIS — R0981 Nasal congestion: Secondary | ICD-10-CM | POA: Diagnosis not present

## 2024-08-27 DIAGNOSIS — J42 Unspecified chronic bronchitis: Secondary | ICD-10-CM | POA: Diagnosis not present

## 2024-08-28 ENCOUNTER — Encounter

## 2024-08-29 ENCOUNTER — Ambulatory Visit: Attending: Cardiovascular Disease

## 2024-08-29 DIAGNOSIS — I4819 Other persistent atrial fibrillation: Secondary | ICD-10-CM

## 2024-08-30 ENCOUNTER — Encounter

## 2024-08-30 LAB — CUP PACEART REMOTE DEVICE CHECK
Date Time Interrogation Session: 20251115230333
Implantable Pulse Generator Implant Date: 20201228

## 2024-08-31 ENCOUNTER — Telehealth: Payer: Self-pay | Admitting: *Deleted

## 2024-08-31 NOTE — Telephone Encounter (Signed)
-----   Message from Wilbert Bihari sent at 08/24/2024  7:21 PM EST ----- Please let patient know that sleep study showed no significant sleep apnea.

## 2024-09-01 NOTE — Progress Notes (Signed)
 Remote Loop Recorder Transmission

## 2024-09-06 ENCOUNTER — Other Ambulatory Visit: Payer: Self-pay | Admitting: Family Medicine

## 2024-09-06 DIAGNOSIS — Z8619 Personal history of other infectious and parasitic diseases: Secondary | ICD-10-CM

## 2024-09-13 ENCOUNTER — Ambulatory Visit: Payer: Self-pay | Admitting: Cardiovascular Disease

## 2024-09-13 ENCOUNTER — Ambulatory Visit (HOSPITAL_COMMUNITY): Admitting: Internal Medicine

## 2024-09-13 NOTE — Telephone Encounter (Signed)
 Pt has completed sleep study which did not show evidence of sleep apnea.

## 2024-09-28 ENCOUNTER — Encounter

## 2024-09-29 ENCOUNTER — Ambulatory Visit

## 2024-09-29 DIAGNOSIS — I4819 Other persistent atrial fibrillation: Secondary | ICD-10-CM

## 2024-09-30 ENCOUNTER — Encounter

## 2024-09-30 LAB — CUP PACEART REMOTE DEVICE CHECK
Date Time Interrogation Session: 20251216231531
Implantable Pulse Generator Implant Date: 20201228

## 2024-09-30 NOTE — Progress Notes (Signed)
 Remote Loop Recorder Transmission

## 2024-10-13 ENCOUNTER — Ambulatory Visit: Payer: Self-pay | Admitting: Cardiovascular Disease

## 2024-10-19 ENCOUNTER — Other Ambulatory Visit: Payer: Self-pay | Admitting: Cardiovascular Disease

## 2024-10-21 ENCOUNTER — Ambulatory Visit: Payer: Self-pay | Admitting: Internal Medicine

## 2024-10-29 ENCOUNTER — Encounter

## 2024-10-30 ENCOUNTER — Ambulatory Visit: Attending: Cardiovascular Disease

## 2024-10-30 DIAGNOSIS — I4819 Other persistent atrial fibrillation: Secondary | ICD-10-CM | POA: Diagnosis not present

## 2024-11-01 ENCOUNTER — Encounter

## 2024-11-02 LAB — CUP PACEART REMOTE DEVICE CHECK
Date Time Interrogation Session: 20260116230530
Implantable Pulse Generator Implant Date: 20201228

## 2024-11-04 NOTE — Progress Notes (Signed)
 Remote Loop Recorder Transmission

## 2024-11-06 ENCOUNTER — Ambulatory Visit: Payer: Self-pay | Admitting: Cardiovascular Disease

## 2024-11-29 ENCOUNTER — Encounter

## 2024-11-30 ENCOUNTER — Ambulatory Visit

## 2024-12-02 ENCOUNTER — Encounter

## 2024-12-30 ENCOUNTER — Encounter

## 2024-12-31 ENCOUNTER — Ambulatory Visit

## 2025-01-03 ENCOUNTER — Encounter

## 2025-01-30 ENCOUNTER — Encounter

## 2025-01-31 ENCOUNTER — Ambulatory Visit

## 2025-02-03 ENCOUNTER — Encounter

## 2025-04-05 ENCOUNTER — Encounter: Payer: Self-pay | Admitting: Family Medicine
